# Patient Record
Sex: Male | Born: 1935 | Race: White | Hispanic: No | Smoking: Former smoker
Health system: Southern US, Community
[De-identification: ages and names within clinical notes are randomized; demographics above are authoritative.]

## PROBLEM LIST (undated history)

## (undated) DIAGNOSIS — K5790 Diverticulosis of intestine, part unspecified, without perforation or abscess without bleeding: Secondary | ICD-10-CM

## (undated) DIAGNOSIS — I456 Pre-excitation syndrome: Secondary | ICD-10-CM

## (undated) DIAGNOSIS — J449 Chronic obstructive pulmonary disease, unspecified: Secondary | ICD-10-CM

## (undated) DIAGNOSIS — I4891 Unspecified atrial fibrillation: Secondary | ICD-10-CM

## (undated) DIAGNOSIS — C8190 Hodgkin lymphoma, unspecified, unspecified site: Secondary | ICD-10-CM

## (undated) DIAGNOSIS — T8859XA Other complications of anesthesia, initial encounter: Secondary | ICD-10-CM

## (undated) DIAGNOSIS — E782 Mixed hyperlipidemia: Secondary | ICD-10-CM

## (undated) DIAGNOSIS — T4145XA Adverse effect of unspecified anesthetic, initial encounter: Secondary | ICD-10-CM

## (undated) DIAGNOSIS — M1990 Unspecified osteoarthritis, unspecified site: Secondary | ICD-10-CM

## (undated) DIAGNOSIS — C3490 Malignant neoplasm of unspecified part of unspecified bronchus or lung: Secondary | ICD-10-CM

## (undated) DIAGNOSIS — I10 Essential (primary) hypertension: Secondary | ICD-10-CM

## (undated) DIAGNOSIS — R1310 Dysphagia, unspecified: Secondary | ICD-10-CM

## (undated) HISTORY — PX: NECK LESION BIOPSY: SHX2078

## (undated) HISTORY — PX: COLONOSCOPY: SHX174

## (undated) HISTORY — PX: SKIN CANCER EXCISION: SHX779

## (undated) HISTORY — DX: Unspecified osteoarthritis, unspecified site: M19.90

## (undated) HISTORY — DX: Chronic obstructive pulmonary disease, unspecified: J44.9

## (undated) HISTORY — DX: Pre-excitation syndrome: I45.6

## (undated) HISTORY — DX: Mixed hyperlipidemia: E78.2

## (undated) HISTORY — DX: Essential (primary) hypertension: I10

## (undated) HISTORY — DX: Malignant neoplasm of unspecified part of unspecified bronchus or lung: C34.90

## (undated) HISTORY — PX: CATARACT EXTRACTION: SUR2

## (undated) HISTORY — DX: Diverticulosis of intestine, part unspecified, without perforation or abscess without bleeding: K57.90

## (undated) HISTORY — DX: Unspecified atrial fibrillation: I48.91

---

## 2009-07-05 ENCOUNTER — Encounter: Payer: Self-pay | Admitting: Cardiology

## 2010-04-05 ENCOUNTER — Encounter: Payer: Self-pay | Admitting: Cardiology

## 2010-04-06 ENCOUNTER — Encounter: Payer: Self-pay | Admitting: Cardiology

## 2010-04-14 ENCOUNTER — Encounter: Payer: Self-pay | Admitting: Cardiology

## 2010-04-18 ENCOUNTER — Ambulatory Visit: Payer: Self-pay | Admitting: Cardiology

## 2010-04-18 DIAGNOSIS — I456 Pre-excitation syndrome: Secondary | ICD-10-CM | POA: Insufficient documentation

## 2010-04-18 DIAGNOSIS — E7800 Pure hypercholesterolemia, unspecified: Secondary | ICD-10-CM

## 2010-04-18 DIAGNOSIS — R011 Cardiac murmur, unspecified: Secondary | ICD-10-CM

## 2010-04-18 DIAGNOSIS — R9430 Abnormal result of cardiovascular function study, unspecified: Secondary | ICD-10-CM | POA: Insufficient documentation

## 2010-04-27 ENCOUNTER — Encounter: Payer: Self-pay | Admitting: Cardiology

## 2010-04-27 ENCOUNTER — Ambulatory Visit: Payer: Self-pay | Admitting: Cardiology

## 2010-05-02 ENCOUNTER — Encounter (INDEPENDENT_AMBULATORY_CARE_PROVIDER_SITE_OTHER): Payer: Self-pay | Admitting: *Deleted

## 2010-05-17 ENCOUNTER — Encounter: Payer: Self-pay | Admitting: Cardiology

## 2010-06-16 ENCOUNTER — Ambulatory Visit: Payer: Self-pay | Admitting: Cardiology

## 2010-06-16 DIAGNOSIS — I10 Essential (primary) hypertension: Secondary | ICD-10-CM

## 2010-12-06 NOTE — Letter (Signed)
Summary: External Correspondence/ OFFICE VISIT [facility]  External Correspondence/ OFFICE VISIT [facility]   Imported By: Dorise Hiss 04/15/2010 10:58:49  _____________________________________________________________________  External Attachment:    Type:   Image     Comment:   External Document

## 2010-12-06 NOTE — Letter (Signed)
Summary: External Correspondence/ OFFICE VISIT [facility]  External Correspondence/ OFFICE VISIT [facility]   Imported By: Dorise Hiss 04/15/2010 11:01:23  _____________________________________________________________________  External Attachment:    Type:   Image     Comment:   External Document

## 2010-12-06 NOTE — Assessment & Plan Note (Signed)
Summary: 2 MO F/U PER 6/13 OV-JM   Visit Type:  Follow-up Primary Provider:  Dr. Doreen Beam   History of Present Illness: 76 year old male presents for followup. He reports no problems with recurrent chest pain or unusual shortness of breath. He indicates tolerating Imdur well.  Dakota Shannon remains comfortable for observation. His Cardiolite study from June is detailed below.  I spoke with him about his blood pressure, elevated today. He has not been checking it at home but does have a blood pressure monitor. I asked him to start taking this at least once a week so he can bring a record when he sees Dr. Sherril Croon next time. He may need further medication adjustments.  No palpitations, dizziness or syncope.  Preventive Screening-Counseling & Management  Alcohol-Tobacco     Smoking Status: quit     Year Quit: 1980  Current Medications (verified): 1)  Gemfibrozil 600 Mg Tabs (Gemfibrozil) .... Take 1 Tablet By Mouth Two Times A Day 2)  Propranolol Hcl 80 Mg Tabs (Propranolol Hcl) .... Take 1 Tablet By Mouth Two Times A Day 3)  Lisinopril 10 Mg Tabs (Lisinopril) .... Take 1 Tablet By Mouth Once A Day 4)  Digoxin 0.25 Mg Tabs (Digoxin) .... Take 1 Tablet By Mouth Once A Day 5)  Hydrocodone-Acetaminophen 5-500 Mg Tabs (Hydrocodone-Acetaminophen) .... Take 1 Tablet By Mouth Four Times A Day As Needed 6)  Centrum Silver  Tabs (Multiple Vitamins-Minerals) .... Take 1 Tablet By Mouth Once A Day 7)  Ecotrin Low Strength 81 Mg Tbec (Aspirin) .... Take 1 Tablet By Mouth Once A Day 8)  Isosorbide Mononitrate Cr 30 Mg Xr24h-Tab (Isosorbide Mononitrate) .... Take One Tablet By Mouth Daily  Allergies (verified): No Known Drug Allergies  Comments:  Nurse/Medical Assistant: The patient's medication list and allergies were reviewed with the patient and were updated in the Medication and Allergy Lists.  Past History:  Past Medical History: Last updated: 04/18/2010 Atrial Fibrillation: based on  records from Dr. Sherril Croon - patient not aware of this - not certain when documented Diabetes Type 2 Hyperlipidemia Hypertension Wolff-Parkinson-White syndrome: based on records from Dr. Sherril Croon - patient only aware of "tachycardia" that was diagnosed back in the 1970's requiring longterm medical therapy - no obvious RFA or EP evaluation as best I can tell Arthritis Diverticulosis and history of adenomatous polyp Heart murmur since childhood  Social History: Last updated: 04/18/2010 Retired  Married  Tobacco Use - Former Alcohol Use - no   Review of Systems  The patient denies anorexia, fever, chest pain, syncope, dyspnea on exertion, peripheral edema, melena, and hematochezia.         Otherwise reviewed and negative.  Vital Signs:  Patient profile:   76 year old male Height:      71 inches Weight:      277 pounds Pulse rate:   57 / minute BP sitting:   164 / 84  (left arm) Cuff size:   large  Vitals Entered By: Carlye Grippe (June 16, 2010 12:46 PM)  Physical Exam  Additional Exam:  Obese male in no acute distress, no active chest pain. HEENT: conjunctivae normal, oropharynx clear. Neck: supple, no JVD or bruits. Lungs: clear to auscultation, nonlabored. Cardiac: RRR, 2/6 systolic murmur at base, no S3 or rub. Abdomen: soft, nontender, no guarding, BS present. Skin: no ulcerations. Musculoskeletal: no kyphosis.   Echocardiogram  Procedure date:  04/27/2010  Findings:      Normal left ventricular chamber size and contraction with LVEF 55-60%,  no major valvular abnormalities.  Nuclear Study  Procedure date:  04/06/2010  Findings:      [facility] report:  Exercise Cardiolite with no diagnostic ST segment changes to indicate ischemia, 86% of maximum age-predicted heart rate, 4 METS, hypertensive response with blood pressure 186/88.  LVEF 76% with normal wall motion. Moderate perfusion defect at the apex, essentially fixed, although described as slightly  more prominent on stress imaging. Study interpreted by Dr. Shelva Majestic to show no significant ischemia, however possible small apical scar versus apical thinning.   Impression & Recommendations:  Problem # 1:  NONSPECIFIC ABNORMAL UNSPEC CV FUNCTION STUDY (ICD-794.30)  No progressive chest pain, actually feels better on Imdur. We discussed the abnormal findings at last visit. He remains comfortable with observation. Stress test was not a high-risk abnormal, and will continue to follow him for now. If symptoms progress however, we have already discussed the possibility of a diagnostic cardiac catheterization. Followup visit in 6 months.  Problem # 2:  ESSENTIAL HYPERTENSION, BENIGN (ICD-401.1)  Blood pressure elevated today. I asked him to keep an eye on this, checking it at home perhaps once a week so that he can bring in a record when he is next seen by Dr. Sherril Croon or in our office. He may need further medication adjustments. Sodium restriction and weight loss discussed.  His updated medication list for this problem includes:    Propranolol Hcl 80 Mg Tabs (Propranolol hcl) .Marland Kitchen... Take 1 tablet by mouth two times a day    Lisinopril 10 Mg Tabs (Lisinopril) .Marland Kitchen... Take 1 tablet by mouth once a day    Ecotrin Low Strength 81 Mg Tbec (Aspirin) .Marland Kitchen... Take 1 tablet by mouth once a day  Patient Instructions: 1)  Your physician wants you to follow-up in: 6 months. You will receive a reminder letter in the mail one-two months in advance. If you don't receive a letter, please call our office to schedule the follow-up appointment. 2)  Your physician recommends that you continue on your current medications as directed. Please refer to the Current Medication list given to you today.

## 2010-12-06 NOTE — Letter (Signed)
Summary: Engineer, materials at [facility]  [address]   [place], [place] [address]   Phone: [phone]  Fax: [phone]        May 02, 2010 MRN: [id number]    [address]TS [address] [place], [place]  [address]    Dear Mr. Mcmanaway,  Your test ordered by Selena Batten has been reviewed by your physician (or physician assistant) and was found to be normal or stable. Your physician (or physician assistant) felt no changes were needed at this time.  _X__ Echocardiogram  ____ Cardiac Stress Test  ____ Lab Work  ____ Peripheral vascular study of arms, legs or neck  ____ CT scan or X-ray  ____ Lung or Breathing test  ____ Other:   Thank you.   Cyril Loosen, RN, BSN    Duane Boston, M.D., F.A.C.C. Thressa Sheller, M.D., F.A.C.C. Oneal Grout, M.D., F.A.C.C. Cheree Ditto, M.D., F.A.C.C. Daiva Nakayama, M.D., F.A.C.C. Kenney Houseman, M.D., F.A.C.C. Jeanne Ivan, PA-C

## 2010-12-06 NOTE — Assessment & Plan Note (Signed)
Summary: NP-ABN STRESS   Visit Type:  Initial Consult Primary Provider:  Dr. Doreen Beam   History of Present Illness: 76 year old male referred for cardiology consultation.  He was referred for an exercise cardiolite at [facility] by Dr. Sherril Croon following a recent physical.  The patient had described intermittent chest pain at that time.  The cardiolite was mildly abnormal as noted below, and it was discussed with Dr. Andee Lineman, resulting in consultation today.  [name] states that he has been experiencing a "tingling" discomfort that begins as a "tight ball" in the center of his chest and then resolves quickly.  These episodes are of mild to moderate intensity and resolve in seconds to minutes.  They are not triggered or associated with activity.  They have been sporatic over the last few months and not progressive.  He states that he walks his dog every morning, at least 1.5 miles at a time, and has not had any of these symptoms.  He has had no palpitations or unusual breathlessness.  Recent labs from 31 May revealed BUN 28, creatinine 1.3, AST 21, ALT 18, triglycerides 213, cholesterol 184, HDL 37, LDL 104, sodium 140, potassium 5.0, TSH 0.3, hemoglobin 14.5, platelets 233.  I reviewed his prior ECG and the stress cardiolite report.  I do not have access to the actual images for review.  Preventive Screening-Counseling & Management  Alcohol-Tobacco     Smoking Status: quit     Year Quit: 1980  Current Medications (verified): 1)  Gemfibrozil 600 Mg Tabs (Gemfibrozil) .... Take 1 Tablet By Mouth Two Times A Day 2)  Propranolol Hcl 80 Mg Tabs (Propranolol Hcl) .... Take 1 Tablet By Mouth Two Times A Day 3)  Lisinopril 10 Mg Tabs (Lisinopril) .... Take 1 Tablet By Mouth Once A Day 4)  Digoxin 0.25 Mg Tabs (Digoxin) .... Take 1 Tablet By Mouth Once A Day 5)  Hydrocodone-Acetaminophen 5-500 Mg Tabs (Hydrocodone-Acetaminophen) .... Take 1 Tablet By Mouth Four Times A Day As  Needed 6)  Centrum Silver  Tabs (Multiple Vitamins-Minerals) .... Take 1 Tablet By Mouth Once A Day 7)  Ecotrin Low Strength 81 Mg Tbec (Aspirin) .... Take 1 Tablet By Mouth Once A Day 8)  Isosorbide Mononitrate Cr 30 Mg Xr24h-Tab (Isosorbide Mononitrate) .... Take One Tablet By Mouth Daily  Allergies (verified): No Known Drug Allergies  Comments:  Nurse/Medical Assistant: The patient's medication list and allergies were reviewed with the patient and were updated in the Medication and Allergy Lists.  Past History:  Family History: Last updated: 2010-04-19 Father: died with lung cancer Brother: diabetes mellitus Mother: diabetes mellitus  Social History: Last updated: 04/19/2010 Retired  Married  Tobacco Use - Former Alcohol Use - no  Past Medical History: Atrial Fibrillation: based on records from Dr. Sherril Croon - patient not aware of this - not certain when documented Diabetes Type 2 Hyperlipidemia Hypertension Wolff-Parkinson-White syndrome: based on records from Dr. Sherril Croon - patient only aware of "tachycardia" that was diagnosed back in the 1970's requiring longterm medical therapy - no obvious RFA or EP evaluation as best I can tell Arthritis Diverticulosis and history of adenomatous polyp Heart murmur since childhood  Past Surgical History: Unremarkable  Family History: Father: died with lung cancer Brother: diabetes mellitus Mother: diabetes mellitus  Social History: Retired  Married  Tobacco Use - Former Alcohol Use - no  Review of Systems       The patient complains of chest pain.  The patient denies anorexia, fever,  weight loss, syncope, dyspnea on exertion, peripheral edema, prolonged cough, headaches, abdominal pain, melena, hematochezia, and severe indigestion/heartburn.         Arthritic knee pain, no claudication.  Otherwise reviewed and negative.  Vital Signs:  Patient profile:   76 year old male Height:      71 inches Weight:      277 pounds BMI:      38.77 Pulse rate:   58 / minute BP sitting:   139 / 76  (left arm) Cuff size:   large  Vitals Entered By: Carlye Grippe (April 18, 2010 2:15 PM)  Nutrition Counseling: Patient's BMI is greater than 25 and therefore counseled on weight management options.  Physical Exam  Additional Exam:  Obese male in no acute distress, no active chest pain. HEENT: conjunctivae normal, oropharynx clear. Neck: supple, no JVD or bruits. Lungs: clear to auscultation, nonlabored. Cardiac: RRR, 2/6 systolic murmur at base, no S3 or rub. Abdomen: soft, nontender, no guarding, BS present. Skin: no ulcerations. Musculoskeletal: no kyphosis. Extremities: no pitting edema, pulses 2 plus. Neuropsychiatric: alert and oriented x 3, affect appropriate.   Nuclear Study  Procedure date:  04/06/2010  Findings:      [facility] report:  Exercise Cardiolite with no diagnostic ST segment changes to indicate ischemia, 86% of maximum age-predicted heart rate, 4 METS, hypertensive response with blood pressure 186/88.  LVEF 76% with normal wall motion. Moderate perfusion defect at the apex, essentially fixed, although described as slightly more prominent on stress imaging. Study interpreted by Dr. Shelva Majestic to show no significant ischemia, however possible small apical scar versus apical thinning.  Impression & Recommendations:  Problem # 1:  NONSPECIFIC ABNORMAL UNSPEC CV FUNCTION STUDY (ICD-794.30)  I reviewed the results of the exercise cardiolite with the patient and we discussed the implications.  No ischemia is reported, and certainly variable attenuation artifact could also explain the results.  His symptoms are overall atypical as noted.  We discussed observation on medical therapy versus proceeding to a diagnostic cardiac catheterization, and at this point he prefers the former.  Will add Imdur 30 mg by mouth at bedtime and I will have him return over the next 8 weeks for repeat clinical assessment,  sooner if symptoms worsen.  Orders: 2-D Echocardiogram (2D Echo)  Problem # 2:  CHEST PAIN (ICD-786.50)  As noted above, atypical, and not exertional.  No associated palpitations.  His updated medication list for this problem includes:    Propranolol Hcl 80 Mg Tabs (Propranolol hcl) .Marland Kitchen... Take 1 tablet by mouth two times a day    Lisinopril 10 Mg Tabs (Lisinopril) .Marland Kitchen... Take 1 tablet by mouth once a day    Ecotrin Low Strength 81 Mg Tbec (Aspirin) .Marland Kitchen... Take 1 tablet by mouth once a day    Isosorbide Mononitrate Cr 30 Mg Xr24h-tab (Isosorbide mononitrate) .Marland Kitchen... Take one tablet by mouth daily  Orders: 2-D Echocardiogram (2D Echo)  Problem # 3:  MURMUR (ICD-785.2)  Reportedly longstanding.  Will arrange an echocardiogram for further evaluation.  His updated medication list for this problem includes:    Propranolol Hcl 80 Mg Tabs (Propranolol hcl) .Marland Kitchen... Take 1 tablet by mouth two times a day    Lisinopril 10 Mg Tabs (Lisinopril) .Marland Kitchen... Take 1 tablet by mouth once a day    Digoxin 0.25 Mg Tabs (Digoxin) .Marland Kitchen... Take 1 tablet by mouth once a day    Isosorbide Mononitrate Cr 30 Mg Xr24h-tab (Isosorbide mononitrate) .Marland Kitchen... Take one tablet by  mouth daily  Problem # 4:  WOLFF (WOLFE)-PARKINSON-WHITE (WPW) SYNDROME (ICD-426.7)  Based on very limited history.  On propranolol and digoxin longterm.  No complant of palpitations with recent symptoms.  His updated medication list for this problem includes:    Propranolol Hcl 80 Mg Tabs (Propranolol hcl) .Marland Kitchen... Take 1 tablet by mouth two times a day    Lisinopril 10 Mg Tabs (Lisinopril) .Marland Kitchen... Take 1 tablet by mouth once a day    Ecotrin Low Strength 81 Mg Tbec (Aspirin) .Marland Kitchen... Take 1 tablet by mouth once a day    Isosorbide Mononitrate Cr 30 Mg Xr24h-tab (Isosorbide mononitrate) .Marland Kitchen... Take one tablet by mouth daily  Patient Instructions: 1)  Your physician has requested that you have an echocardiogram.  Echocardiography is a painless test that uses  sound waves to create images of your heart. It provides your doctor with information about the size and shape of your heart and how well your heart's chambers and valves are working.  This procedure takes approximately one hour. There are no restrictions for this procedure. 2)  Start Imdur (isosorbide) 30mg  by mouth once daily. 3)  Follow up appt with Dr. Diona Browner on Thursday, June 16, 2010 at 1pm. Prescriptions: ISOSORBIDE MONONITRATE CR 30 MG XR24H-TAB (ISOSORBIDE MONONITRATE) Take one tablet by mouth daily  #30 x 6   Entered by:   Cyril Loosen, RN, BSN   Authorized by:   Loreli Slot, MD, [facility]   Signed by:   Cyril Loosen, RN, BSN on 04/18/2010   Method used:   Electronically to        [facility]* (retail)       [address]       [place], [place]  [address]       Ph: [phone]       Fax: [phone]   RxID:   [id number]   Handout requested.

## 2010-12-06 NOTE — Letter (Signed)
Summary: Internal Other/ PATIENT HISTORY FORM  Internal Other/ PATIENT HISTORY FORM   Imported By: Dorise Hiss 04/18/2010 14:04:16  _____________________________________________________________________  External Attachment:    Type:   Image     Comment:   External Document

## 2010-12-16 ENCOUNTER — Encounter: Payer: Self-pay | Admitting: Cardiology

## 2010-12-16 ENCOUNTER — Ambulatory Visit (INDEPENDENT_AMBULATORY_CARE_PROVIDER_SITE_OTHER): Payer: Medicare Other | Admitting: Cardiology

## 2010-12-16 DIAGNOSIS — E782 Mixed hyperlipidemia: Secondary | ICD-10-CM

## 2010-12-16 DIAGNOSIS — I10 Essential (primary) hypertension: Secondary | ICD-10-CM

## 2010-12-16 DIAGNOSIS — I456 Pre-excitation syndrome: Secondary | ICD-10-CM

## 2010-12-22 NOTE — Assessment & Plan Note (Signed)
Summary: 6 MO FU PER FEB REMINDER-SRS/JM   Visit Type:  Follow-up Primary Provider:  Dr. Doreen Beam   History of Present Illness: 76 year old male presents for followup. He was seen in August 2011. He reports decreased energy, no obvious chest pain with exertion however.  He states he walks approximately one to one and 1/2 miles each morning, slowly. He is less active in the winter. Today we spent time discussing diet with a goal of weight loss as well as more regular aerobic exercise.  He cares for his chronically ill wife, recovering from cancer. Certainly there is stress involved with this.  We also discussed blood pressure control. Sodium restriction and dietary measures were reviewed.  No complaint of palpitations, dizziness, or syncope.  Preventive Screening-Counseling & Management  Alcohol-Tobacco     Smoking Status: quit     Year Quit: 1980  Current Medications (verified): 1)  Gemfibrozil 600 Mg Tabs (Gemfibrozil) .... Take 1 Tablet By Mouth Two Times A Day 2)  Propranolol Hcl 80 Mg Tabs (Propranolol Hcl) .... Take 1 Tablet By Mouth Two Times A Day 3)  Lisinopril 20 Mg Tabs (Lisinopril) .... Take 1 Tablet By Mouth Once A Day 4)  Digoxin 0.25 Mg Tabs (Digoxin) .... Take 1 Tablet By Mouth Once A Day 5)  Hydrocodone-Acetaminophen 5-500 Mg Tabs (Hydrocodone-Acetaminophen) .... Take 1 Tablet By Mouth Four Times A Day As Needed 6)  Centrum Silver  Tabs (Multiple Vitamins-Minerals) .... Take 1 Tablet By Mouth Once A Day 7)  Ecotrin Low Strength 81 Mg Tbec (Aspirin) .... Take 1 Tablet By Mouth Once A Day 8)  Isosorbide Mononitrate Cr 30 Mg Xr24h-Tab (Isosorbide Mononitrate) .... Take One Tablet By Mouth Daily  Allergies (verified): No Known Drug Allergies  Comments:  Nurse/Medical Assistant: The patient's medication list and allergies were reviewed with the patient and were updated in the Medication and Allergy Lists.  Past History:  Past Medical History: Last updated:  04/18/2010 Atrial Fibrillation: based on records from Dr. Sherril Croon - patient not aware of this - not certain when documented Diabetes Type 2 Hyperlipidemia Hypertension Wolff-Parkinson-White syndrome: based on records from Dr. Sherril Croon - patient only aware of "tachycardia" that was diagnosed back in the 1970's requiring longterm medical therapy - no obvious RFA or EP evaluation as best I can tell Arthritis Diverticulosis and history of adenomatous polyp Heart murmur since childhood  Social History: Last updated: 04/18/2010 Retired  Married  Tobacco Use - Former Alcohol Use - no  Review of Systems       The patient complains of weight gain and dyspnea on exertion.  The patient denies anorexia, fever, chest pain, syncope, peripheral edema, hemoptysis, abdominal pain, melena, and hematochezia.         Otherwise reviewed and negative except as outlined.  Vital Signs:  Patient profile:   76 year old male Height:      71 inches Weight:      282 pounds Pulse rate:   65 / minute BP sitting:   163 / 91  (left arm) Cuff size:   large  Vitals Entered By: Carlye Grippe (December 16, 2010 9:20 AM)  Physical Exam  Additional Exam:  Obese male in no acute distress, no active chest pain. HEENT: conjunctivae normal, oropharynx clear. Neck: supple, no JVD or bruits. Lungs: clear to auscultation, nonlabored. Cardiac: RRR, 2/6 systolic murmur at base, no S3 or rub. Abdomen: soft, nontender, no guarding, BS present. Skin: no ulcerations. Musculoskeletal: no kyphosis. Extremities: No pitting edema. Neuropsychiatric:  Alert and oriented x3, affect appropriate.   EKG  Procedure date:  12/16/2010  Findings:      Sinus rhythm at 64 beats per minute with nonspecific ST-T changes, likely digoxin effect.  Prior Report Reviewed for Nuclear Study:  Findings: 04/06/2010 [facility] Internal Medicine report:  Exercise Cardiolite with no diagnostic ST segment changes to indicate ischemia, 86% of maximum  age-predicted heart rate, 4 METS, hypertensive response with blood pressure 186/88.  LVEF 76% with normal wall motion. Moderate perfusion defect at the apex, essentially fixed, although described as slightly more prominent on stress imaging. Study interpreted by Dr. Shelva Majestic to show no significant ischemia, however possible small apical scar versus apical thinning.  Prior Report Reviewed for Echocardiogram:  Findings: 04/27/2010 Normal left ventricular chamber size and contraction with LVEF 55-60%, no major valvular abnormalities.  Comments:    Impression & Recommendations:  Problem # 1:  ESSENTIAL HYPERTENSION, BENIGN (ICD-401.1)  Not well-controlled. Plan to advance lisinopril to 20 mg daily, followup BMET in 2 weeks. In addition we discussed diet with goal of weight loss, sodium restriction.  His updated medication list for this problem includes:    Propranolol Hcl 80 Mg Tabs (Propranolol hcl) .Marland Kitchen... Take 1 tablet by mouth two times a day    Lisinopril 20 Mg Tabs (Lisinopril) .Marland Kitchen... Take 1 tablet by mouth once a day    Ecotrin Low Strength 81 Mg Tbec (Aspirin) .Marland Kitchen... Take 1 tablet by mouth once a day  Future Orders: T-Basic Metabolic Panel [id number]) ... 12/30/2010  Problem # 2:  NONSPECIFIC ABNORMAL UNSPEC CV FUNCTION STUDY (ICD-794.30)  Noted above. No obvious anginal symptoms. Continue observation.  Problem # 3:  WOLFF (WOLFE)-PARKINSON-WHITE (WPW) SYNDROME (ICD-426.7)  Based on history. No reported palpitations.  His updated medication list for this problem includes:    Propranolol Hcl 80 Mg Tabs (Propranolol hcl) .Marland Kitchen... Take 1 tablet by mouth two times a day    Lisinopril 20 Mg Tabs (Lisinopril) .Marland Kitchen... Take 1 tablet by mouth once a day    Ecotrin Low Strength 81 Mg Tbec (Aspirin) .Marland Kitchen... Take 1 tablet by mouth once a day    Isosorbide Mononitrate Cr 30 Mg Xr24h-tab (Isosorbide mononitrate) .Marland Kitchen... Take one tablet by mouth daily  Problem # 4:  PURE HYPERCHOLESTEROLEMIA  (ICD-272.0)  Followed by Dr. Sherril Croon.  His updated medication list for this problem includes:    Gemfibrozil 600 Mg Tabs (Gemfibrozil) .Marland Kitchen... Take 1 tablet by mouth two times a day  Other Orders: EKG w/ Interpretation (93000)  Patient Instructions: 1)  Increase Lisinopril to 20mg  daily 2)  Labs:  BMET in 2 weeks 3)  Follow up in  6 months Prescriptions: LISINOPRIL 20 MG TABS (LISINOPRIL) Take 1 tablet by mouth once a day  #30 x 6   Entered by:   Hoover Brunette, LPN   Authorized by:   Loreli Slot, MD, [facility]   Signed by:   Hoover Brunette, LPN on 09/81/1914   Method used:   Electronically to        [facility]* (retail)       [address]       [place], [place]  [address]       Ph: [phone]       Fax: [phone]   RxID:   [id number]

## 2010-12-30 ENCOUNTER — Encounter: Payer: Self-pay | Admitting: Cardiology

## 2011-01-03 ENCOUNTER — Encounter (INDEPENDENT_AMBULATORY_CARE_PROVIDER_SITE_OTHER): Payer: Self-pay | Admitting: *Deleted

## 2011-01-12 NOTE — Letter (Signed)
Summary: Engineer, materials at [facility]  [address]   [place], [place] [address]   Phone: [phone]  Fax: [phone]        January 03, 2011 MRN: [id number]    Dakota Shannon [address] [place], [place]  [address]    Dear [name],  Your test ordered by Selena Batten has been reviewed by your physician (or physician assistant) and was found to be normal or stable. Your physician (or physician assistant) felt no changes were needed at this time.  ____ Echocardiogram  ____ Cardiac Stress Test  _X___ Lab Work  ____ Peripheral vascular study of arms, legs or neck  ____ CT scan or X-ray  ____ Lung or Breathing test  ____ Other:   Thank you.   Cyril Loosen, RN, BSN    Duane Boston, M.D., F.A.C.C. Thressa Sheller, M.D., F.A.C.C. Oneal Grout, M.D., F.A.C.C. Cheree Ditto, M.D., F.A.C.C. Daiva Nakayama, M.D., F.A.C.C. Kenney Houseman, M.D., F.A.C.C. Jeanne Ivan, PA-C

## 2011-06-04 ENCOUNTER — Other Ambulatory Visit: Payer: Self-pay | Admitting: Cardiology

## 2011-06-05 NOTE — Telephone Encounter (Signed)
[place] pt. 

## 2011-07-03 ENCOUNTER — Ambulatory Visit (INDEPENDENT_AMBULATORY_CARE_PROVIDER_SITE_OTHER): Payer: Medicare Other | Admitting: Cardiology

## 2011-07-03 ENCOUNTER — Encounter: Payer: Self-pay | Admitting: Cardiology

## 2011-07-03 VITALS — BP 153/88 | HR 65 | Ht 71.0 in | Wt 272.0 lb

## 2011-07-03 DIAGNOSIS — R011 Cardiac murmur, unspecified: Secondary | ICD-10-CM

## 2011-07-03 DIAGNOSIS — I456 Pre-excitation syndrome: Secondary | ICD-10-CM

## 2011-07-03 DIAGNOSIS — I10 Essential (primary) hypertension: Secondary | ICD-10-CM

## 2011-07-03 NOTE — Assessment & Plan Note (Signed)
Benign. Echocardiogram from June of last year showed LVEF 55-60% with no significant valvular abnormalities. 

## 2011-07-03 NOTE — Assessment & Plan Note (Signed)
Continue present regimen. Weight loss is indicated. We discussed diet and exercise. May still need further medication titration.

## 2011-07-03 NOTE — Patient Instructions (Signed)
Your physician wants you to follow-up in: 6 months. You will receive a reminder letter in the mail one-two months in advance. If you don't receive a letter, please call our office to schedule the follow-up appointment. Your physician recommends that you continue on your current medications as directed. Please refer to the Current Medication list given to you today. 

## 2011-07-03 NOTE — Assessment & Plan Note (Signed)
Quiescent at this time

## 2011-07-03 NOTE — Progress Notes (Signed)
Clinical Summary Mr. Koenen is a 76 y.o.male presenting for followup. He was seen in February of this year.  He states that he has been feeling "pretty good." No chest pain or progressive shortness of breath. Does walk approximately a mile in the mornings at a slow pace.  Blood pressure has been elevated, and medication doses adjusted over time. He reports compliance with a low-salt diet, still states that he "eats well." He talked about diet, portion size, weight loss.  Denies any significant palpitations. ECG is reviewed.  No Known Allergies  Medication list reviewed.  Past Medical History  Diagnosis Date  . Atrial fibrillation     Possible history based on limited information  . Type 2 diabetes mellitus   . Mixed hyperlipidemia   . Essential hypertension, benign   . Wolff-Parkinson-White (WPW) syndrome     Possible history based on limited information  . Arthritis   . Diverticulosis   . Heart murmur     Social History Mr. Speir reports that he quit smoking about 31 years ago. His smoking use included Cigarettes. He has a 20 pack-year smoking history. He has never used smokeless tobacco. Mr. Salada reports that he does not drink alcohol.  Review of Systems As outlined above, otherwise negative.  Physical Examination Filed Vitals:   07/03/11 1544  BP: 153/88  Pulse: 65   Obese male in no acute distress, no active chest pain.  HEENT: conjunctivae normal, oropharynx clear.  Neck: supple, no JVD or bruits.  Lungs: clear to auscultation, nonlabored.  Cardiac: RRR, 2/6 systolic murmur at base, no S3 or rub.  Abdomen: soft, nontender, no guarding, BS present.  Skin: no ulcerations.  Musculoskeletal: no kyphosis.  Extremities: No pitting edema.  Neuropsychiatric: Alert and oriented x3, affect appropriate.   ECG Reviewed in EMR.  Studies Exercise Cardiolite 04/06/2010 [facility]): No diagnostic ST segment changes to indicate ischemia, 86% of maximum  age-predicted heart rate, 4 METS, hypertensive response with blood pressure 186/88. LVEF 76% with normal wall motion. Moderate perfusion defect at the apex, essentially fixed, although described as slightly more prominent on stress imaging. Study interpreted by Dr. Shelva Majestic to show no significant ischemia, however possible small apical scar versus apical thinning.   Problem List and Plan

## 2011-07-21 ENCOUNTER — Other Ambulatory Visit: Payer: Self-pay | Admitting: *Deleted

## 2011-07-21 MED ORDER — LISINOPRIL 20 MG PO TABS: 20.0000 mg | ORAL_TABLET | Freq: Every day | ORAL | Status: DC

## 2012-02-02 ENCOUNTER — Other Ambulatory Visit: Payer: Self-pay | Admitting: Cardiology

## 2012-03-18 ENCOUNTER — Other Ambulatory Visit: Payer: Self-pay | Admitting: *Deleted

## 2012-03-18 MED ORDER — LISINOPRIL 20 MG PO TABS: 20.0000 mg | ORAL_TABLET | Freq: Every day | ORAL | Status: DC

## 2012-06-05 ENCOUNTER — Other Ambulatory Visit: Payer: Self-pay | Admitting: Cardiology

## 2012-06-05 ENCOUNTER — Telehealth: Payer: Self-pay | Admitting: Cardiology

## 2012-06-05 MED ORDER — ISOSORBIDE MONONITRATE ER 30 MG PO TB24: 30.0000 mg | ORAL_TABLET | Freq: Every day | ORAL | Status: DC

## 2012-06-05 NOTE — Telephone Encounter (Signed)
Patient called in and wanted refill for IMDUR. I sent refill in to [facility] for a 30 day supply with 3 refills. Transferred call to [facility] to make office visit.

## 2012-07-12 ENCOUNTER — Ambulatory Visit (INDEPENDENT_AMBULATORY_CARE_PROVIDER_SITE_OTHER): Payer: Medicare Other | Admitting: Cardiology

## 2012-07-12 ENCOUNTER — Encounter: Payer: Self-pay | Admitting: Cardiology

## 2012-07-12 VITALS — BP 159/86 | HR 54 | Ht 67.0 in | Wt 264.0 lb

## 2012-07-12 DIAGNOSIS — I456 Pre-excitation syndrome: Secondary | ICD-10-CM

## 2012-07-12 DIAGNOSIS — E7800 Pure hypercholesterolemia, unspecified: Secondary | ICD-10-CM

## 2012-07-12 DIAGNOSIS — I10 Essential (primary) hypertension: Secondary | ICD-10-CM

## 2012-07-12 NOTE — Patient Instructions (Addendum)
Your physician recommends that you schedule a follow-up appointment in: 1 year. You will receive a reminder letter in the mail in about months 10 reminding you to call and schedule your appointment. If you don't receive this letter, please contact our office.  Your physician recommends that you continue on your current medications as directed. Please refer to the Current Medication list given to you today.  

## 2012-07-12 NOTE — Progress Notes (Signed)
   Clinical Summary Dakota Shannon is a 77 y.o.male presenting for followup. He was seen in August of last year. He reports no sense of palpitations, no breathlessness with typical activity. Still walks at least a mile a day.  His blood pressure is elevated today. He states that when he checks it at home, systolics are typically in the 130s however. He reports compliance with his medications.  ECG today shows sinus bradycardia. He reports having a recent wellness physical with Dr. Sherril Croon - we will request lab work.   No Known Allergies  Current Outpatient Prescriptions  Medication Sig Dispense Refill  . aspirin EC 81 MG tablet Take 81 mg by mouth daily.        . digoxin (LANOXIN) 0.25 MG tablet Take 250 mcg by mouth daily.        Marland Kitchen gemfibrozil (LOPID) 600 MG tablet Take 600 mg by mouth 2 (two) times daily before a meal.        . HYDROcodone-acetaminophen (VICODIN) 5-500 MG per tablet Take 1 tablet by mouth every 6 (six) hours as needed.        . isosorbide mononitrate (IMDUR) 30 MG 24 hr tablet Take 1 tablet (30 mg total) by mouth daily.  30 tablet  3  . lisinopril (PRINIVIL,ZESTRIL) 20 MG tablet Take 1 tablet (20 mg total) by mouth daily.  30 tablet  1  . Multiple Vitamins-Minerals (CENTRUM SILVER PO) Take 1 tablet by mouth daily.        . propranolol (INDERAL) 80 MG tablet Take 80 mg by mouth 2 (two) times daily.          Past Medical History  Diagnosis Date  . Atrial fibrillation     Possible history based on limited information  . Type 2 diabetes mellitus   . Mixed hyperlipidemia   . Essential hypertension, benign   . Wolff-Parkinson-White (WPW) syndrome     Possible history based on limited information  . Arthritis   . Diverticulosis   . Heart murmur     Social History [name] reports that he quit smoking about 32 years ago. His smoking use included Cigarettes. He has a 20 pack-year smoking history. He has never used smokeless tobacco. [name] reports that he does not  drink alcohol.  Review of Systems No orthopnea or PND. No claudication. Otherwise negative.  Physical Examination Filed Vitals:   07/12/12 0819  BP: 159/86  Pulse: 54    Obese male in no acute distress, no active chest pain.  HEENT: conjunctivae normal, oropharynx clear.  Neck: supple, no JVD or bruits.  Lungs: clear to auscultation, nonlabored.  Cardiac: RRR, 2/6 systolic murmur at base, no S3 or rub.  Abdomen: soft, nontender, no guarding, BS present.  Skin: no ulcerations.  Musculoskeletal: no kyphosis.  Extremities: No pitting edema.  Neuropsychiatric: Alert and oriented x3, affect appropriate.    Problem List and Plan   ESSENTIAL HYPERTENSION, BENIGN Encouraged regular exercise and weight loss, also sodium restriction. Asked him to keep a more regular check on blood pressure home. If the systolics are ranging more in the 140s or higher, we will need to consider increasing his lisinopril further.  PURE HYPERCHOLESTEROLEMIA We will request lab work for review from Dr. Sherril Croon.  WOLFF (WOLFE)-PARKINSON-WHITE (WPW) SYNDROME Quiescent.    Jonelle Sidle, M.D., F.A.C.C.

## 2012-07-12 NOTE — Assessment & Plan Note (Signed)
Encouraged regular exercise and weight loss, also sodium restriction. Asked him to keep a more regular check on blood pressure home. If the systolics are ranging more in the 140s or higher, we will need to consider increasing his lisinopril further.

## 2012-07-12 NOTE — Assessment & Plan Note (Signed)
Quiescent

## 2012-07-12 NOTE — Assessment & Plan Note (Signed)
We will request lab work for review from Dr. Sherril Croon.

## 2012-10-01 ENCOUNTER — Other Ambulatory Visit: Payer: Self-pay | Admitting: Cardiology

## 2012-10-01 NOTE — Telephone Encounter (Signed)
rx sent to pharmacy by e-script per pt compliance

## 2013-07-04 ENCOUNTER — Other Ambulatory Visit: Payer: Self-pay | Admitting: Cardiology

## 2013-07-28 ENCOUNTER — Ambulatory Visit: Payer: Medicare Other | Admitting: Cardiology

## 2013-07-28 ENCOUNTER — Ambulatory Visit (INDEPENDENT_AMBULATORY_CARE_PROVIDER_SITE_OTHER): Payer: Medicare Other | Admitting: Cardiology

## 2013-07-28 ENCOUNTER — Encounter: Payer: Self-pay | Admitting: Cardiology

## 2013-07-28 VITALS — BP 176/80 | HR 59 | Ht 71.0 in | Wt 268.0 lb

## 2013-07-28 DIAGNOSIS — I456 Pre-excitation syndrome: Secondary | ICD-10-CM

## 2013-07-28 DIAGNOSIS — I10 Essential (primary) hypertension: Secondary | ICD-10-CM

## 2013-07-28 NOTE — Patient Instructions (Signed)
Continue all current medications. Your physician wants you to follow up in: 6 months.  You will receive a reminder letter in the mail one-two months in advance.  If you don't receive a letter, please call our office to schedule the follow up appointment   

## 2013-07-28 NOTE — Assessment & Plan Note (Signed)
Symptomatically stable, ECG reviewed. Continue current regimen. 

## 2013-07-28 NOTE — Assessment & Plan Note (Signed)
He reports medication compliance. We discussed diet, sodium restriction and weight loss. I also asked him to start recording blood pressures over the next few weeks, then follow with Dr. Sherril Croon. He will likely need further up titration of lisinopril, perhaps even addition of HCTZ.

## 2013-07-28 NOTE — Progress Notes (Signed)
   Clinical Summary Dakota Shannon is a 78 y.o.male last seen in September 2013. He reports no palpitations, no anginal chest pain. Admits that he has not been exercising as regularly.  ECG reviewed showing sinus rhythm with short PR interval.  Blood pressure significantly elevated today. He reports compliance with his medications. He has not been checking his blood pressures regularly. He is due to see Dr. Sherril Shannon next month.  No Known Allergies  Current Outpatient Prescriptions  Medication Sig Dispense Refill  . aspirin EC 81 MG tablet Take 81 mg by mouth daily.        . digoxin (LANOXIN) 0.25 MG tablet Take 250 mcg by mouth daily.        Marland Kitchen gemfibrozil (LOPID) 600 MG tablet Take 600 mg by mouth 2 (two) times daily before a meal.        . HYDROcodone-acetaminophen (VICODIN) 5-500 MG per tablet Take 1 tablet by mouth every 6 (six) hours as needed.        . isosorbide mononitrate (IMDUR) 30 MG 24 hr tablet TAKE 1 TABLET BY MOUTH EVERY DAY  30 tablet  6  . lisinopril (PRINIVIL,ZESTRIL) 20 MG tablet Take 1 tablet (20 mg total) by mouth daily.  30 tablet  1  . Multiple Vitamins-Minerals (CENTRUM SILVER PO) Take 1 tablet by mouth daily.        . propranolol (INDERAL) 80 MG tablet Take 80 mg by mouth 2 (two) times daily.         No current facility-administered medications for this visit.    Past Medical History  Diagnosis Date  . Atrial fibrillation     Possible history based on limited information  . Type 2 diabetes mellitus   . Mixed hyperlipidemia   . Essential hypertension, benign   . Wolff-Parkinson-White (WPW) syndrome     Possible history based on limited information  . Arthritis   . Diverticulosis   . Heart murmur     Social History Dakota Shannon reports that he quit smoking about 33 years ago. His smoking use included Cigarettes. He has a 20 pack-year smoking history. He has never used smokeless tobacco. Dakota Shannon reports that he does not drink alcohol.  Review of Systems As  outlined above. States that he had a melanoma removed, also umbilical surgery. Otherwise negative.  Physical Examination Filed Vitals:   07/28/13 1452  BP: 176/80  Pulse: 59   Filed Weights   07/28/13 1452  Weight: 268 lb (121.564 kg)    Obese male in no acute distress, no active chest pain.  HEENT: conjunctivae normal, oropharynx clear.  Neck: supple, no JVD or bruits.  Lungs: clear to auscultation, nonlabored.  Cardiac: RRR, 2/6 systolic murmur at base, no S3 or rub.  Abdomen: soft, nontender, no guarding, BS present.  Skin: no ulcerations.  Musculoskeletal: no kyphosis.  Extremities: No pitting edema.  Neuropsychiatric: Alert and oriented x3, affect appropriate.   Problem List and Plan   WOLFF (WOLFE)-PARKINSON-WHITE (WPW) SYNDROME Symptomatically stable, ECG reviewed. Continue current regimen.  Essential hypertension, benign He reports medication compliance. We discussed diet, sodium restriction and weight loss. I also asked him to start recording blood pressures over the next few weeks, then follow with Dr. Sherril Shannon. He will likely need further up titration of lisinopril, perhaps even addition of HCTZ.    Jonelle Sidle, M.D., F.A.C.C.

## 2013-09-11 ENCOUNTER — Other Ambulatory Visit: Payer: Self-pay

## 2014-02-23 ENCOUNTER — Encounter: Payer: Self-pay | Admitting: Cardiology

## 2014-02-27 ENCOUNTER — Other Ambulatory Visit: Payer: Self-pay | Admitting: Cardiology

## 2014-03-18 ENCOUNTER — Encounter: Payer: Self-pay | Admitting: Cardiology

## 2014-03-18 ENCOUNTER — Ambulatory Visit (INDEPENDENT_AMBULATORY_CARE_PROVIDER_SITE_OTHER): Payer: Medicare Other | Admitting: Cardiology

## 2014-03-18 VITALS — BP 153/81 | HR 54 | Ht 71.0 in | Wt 263.0 lb

## 2014-03-18 DIAGNOSIS — I456 Pre-excitation syndrome: Secondary | ICD-10-CM

## 2014-03-18 DIAGNOSIS — I10 Essential (primary) hypertension: Secondary | ICD-10-CM

## 2014-03-18 DIAGNOSIS — R011 Cardiac murmur, unspecified: Secondary | ICD-10-CM

## 2014-03-18 NOTE — Assessment & Plan Note (Signed)
Benign. Echocardiogram from June of last year showed LVEF 55-60% with no significant valvular abnormalities.

## 2014-03-18 NOTE — Patient Instructions (Signed)
Continue all current medications. Your physician wants you to follow up in: 6 months.  You will receive a reminder letter in the mail one-two months in advance.  If you don't receive a letter, please call our office to schedule the follow up appointment   

## 2014-03-18 NOTE — Progress Notes (Signed)
    Clinical Summary Mr. Hulsebus is a 79 y.o.male last seen in September 2014. He reports no palpitations or chest pain. 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 He reports compliance with his medications. Weight is down about 5 pounds.  ECG from September shows sinus bradycardia no definite evidence of preexcitation.  Blood pressure is mildly elevated today. He recalls a systolic of "242" at his last visit with Dr. Woody Seller.   No Known Allergies  Current Outpatient Prescriptions  Medication Sig Dispense Refill  . aspirin EC 81 MG tablet Take 81 mg by mouth daily.        . digoxin (LANOXIN) 0.25 MG tablet Take 250 mcg by mouth daily.        Marland Kitchen gemfibrozil (LOPID) 600 MG tablet Take 600 mg by mouth 2 (two) times daily before a meal.        . HYDROcodone-acetaminophen (VICODIN) 5-500 MG per tablet Take 1 tablet by mouth every 6 (six) hours as needed.        . isosorbide mononitrate (IMDUR) 30 MG 24 hr tablet TAKE 1 TABLET BY MOUTH EVERY DAY  30 tablet  6  . lisinopril (PRINIVIL,ZESTRIL) 20 MG tablet Take one tablet by mouth every morning & 1/2 tablet every evening      . Multiple Vitamins-Minerals (CENTRUM SILVER PO) Take 1 tablet by mouth daily.        . propranolol (INDERAL) 80 MG tablet Take 80 mg by mouth 2 (two) times daily.         No current facility-administered medications for this visit.    Past Medical History  Diagnosis Date  . Atrial fibrillation     Possible history based on limited information  . Type 2 diabetes mellitus   . Mixed hyperlipidemia   . Essential hypertension, benign   . Wolff-Parkinson-White (WPW) syndrome     Possible history based on limited information  . Arthritis   . Diverticulosis   . Heart murmur     Social History Mr. Luis reports that he quit smoking about 34 years ago. His smoking use included Cigarettes. He has a 20 pack-year smoking history. He has never used smokeless tobacco. Mr. Gulley reports that he does  not drink alcohol.  Review of Systems Negative except as outlined.  Physical Examination Filed Vitals:   03/18/14 1448  BP: 153/81  Pulse: 54   Filed Weights   03/18/14 1448  Weight: 263 lb (119.296 kg)    Obese male in no acute distress.  HEENT: conjunctivae normal, oropharynx clear.  Neck: supple, no JVD or bruits.  Lungs: clear to auscultation, nonlabored.  Cardiac: RRR, 2/6 systolic murmur at base, no S3 or rub.  Abdomen: soft, nontender, no guarding, BS present.  Skin: no ulcerations.  Musculoskeletal: no kyphosis.  Extremities: No pitting edema.  Neuropsychiatric: Alert and oriented x3, affect appropriate.   Problem List and Plan   WOLFF (WOLFE)-PARKINSON-WHITE (WPW) SYNDROME Quiescent. No palpitations, dizziness, syncope.  Essential hypertension, benign Blood pressure mildly elevated today. I asked him to continue to work on weight loss. Reduce sodium in the diet. Lisinopril could be changed to lisinopril HCT 40/12.5 mg daily with perhaps better blood pressure control. He will follow up with Dr. Woody Seller later in the summer for a physical.  MURMUR Benign. Echocardiogram from June of last year showed LVEF 55-60% with no significant valvular abnormalities.    Satira Sark, M.D., F.A.C.C.

## 2014-03-18 NOTE — Assessment & Plan Note (Signed)
Quiescent. No palpitations, dizziness, syncope.

## 2014-03-18 NOTE — Assessment & Plan Note (Signed)
Blood pressure mildly elevated today. I asked him to continue to work on weight loss. Reduce sodium in the diet. Lisinopril could be changed to lisinopril HCT 40/12.5 mg daily with perhaps better blood pressure control. He will follow up with Dr. Woody Seller later in the summer for a physical.

## 2014-09-23 ENCOUNTER — Other Ambulatory Visit: Payer: Self-pay | Admitting: Cardiology

## 2014-10-23 ENCOUNTER — Other Ambulatory Visit: Payer: Self-pay | Admitting: Cardiology

## 2014-11-17 ENCOUNTER — Telehealth: Payer: Self-pay | Admitting: Cardiology

## 2014-11-17 MED ORDER — ISOSORBIDE MONONITRATE ER 30 MG PO TB24: 30.0000 mg | ORAL_TABLET | Freq: Every day | ORAL | Status: DC

## 2014-11-17 NOTE — Telephone Encounter (Signed)
Mr. Simmers needs refill on   isosorbide mononitrate (IMDUR) 30 MG 24 hr tablet  States that [facility] told him to call office for A refill. Appointment is being made for patient also to see. Dr. Domenic Polite.

## 2014-11-25 ENCOUNTER — Other Ambulatory Visit: Payer: Self-pay | Admitting: Cardiology

## 2014-12-02 ENCOUNTER — Ambulatory Visit (INDEPENDENT_AMBULATORY_CARE_PROVIDER_SITE_OTHER): Payer: Medicare Other | Admitting: Cardiology

## 2014-12-02 ENCOUNTER — Encounter: Payer: Self-pay | Admitting: Cardiology

## 2014-12-02 VITALS — BP 148/72 | HR 60 | Ht 71.0 in | Wt 274.0 lb

## 2014-12-02 DIAGNOSIS — R010 Benign and innocent cardiac murmurs: Secondary | ICD-10-CM

## 2014-12-02 DIAGNOSIS — I456 Pre-excitation syndrome: Secondary | ICD-10-CM

## 2014-12-02 DIAGNOSIS — R011 Cardiac murmur, unspecified: Secondary | ICD-10-CM

## 2014-12-02 DIAGNOSIS — I10 Essential (primary) hypertension: Secondary | ICD-10-CM

## 2014-12-02 NOTE — Assessment & Plan Note (Signed)
Reported by history, no preexcitation pattern on ECG. He continues on Inderal and digoxin, reports no symptoms.

## 2014-12-02 NOTE — Assessment & Plan Note (Signed)
No obvious valvular disease reported approximately 4 years ago, follow-up echocardiogram will be obtained however given persistence and slight progression in murmur.

## 2014-12-02 NOTE — Patient Instructions (Signed)
Your physician recommends that you schedule a follow-up appointment in: 8 months. You will receive a reminder letter in the mail in about 6 months reminding you to call and schedule your appointment. If you don't receive this letter, please contact our office. Your physician recommends that you continue on your current medications as directed. Please refer to the Current Medication list given to you today. Your physician has requested that you have an echocardiogram. Echocardiography is a painless test that uses sound waves to create images of your heart. It provides your doctor with information about the size and shape of your heart and how well your heart's chambers and valves are working. This procedure takes approximately one hour. There are no restrictions for this procedure.

## 2014-12-02 NOTE — Progress Notes (Signed)
   Reason for visit: History of WPW  Clinical Summary Dakota Shannon is a 80 y.o.male last seen in May 2015. He comes in for a routine visit today. He has had no palpitations, no sudden dizziness or syncope. Continues on digoxin and Inderal. ECG today shows sinus bradycardia with nonspecific T-wave changes, no obvious preexcitation.  He continues to function with no change in his typical ADLs. No exertional chest pain or progressing shortness of breath.  Echocardiogram from June 2011 revealed LVEF 55-60%, no major valvular abnormalities.   No Known Allergies  Current Outpatient Prescriptions  Medication Sig Dispense Refill  . aspirin EC 81 MG tablet Take 81 mg by mouth daily.      . digoxin (LANOXIN) 0.25 MG tablet Take 250 mcg by mouth daily.      Marland Kitchen gemfibrozil (LOPID) 600 MG tablet Take 600 mg by mouth 2 (two) times daily before a meal.      . HYDROcodone-acetaminophen (VICODIN) 5-500 MG per tablet Take 1 tablet by mouth every 6 (six) hours as needed.      . isosorbide mononitrate (IMDUR) 30 MG 24 hr tablet TAKE 1 TABLET BY MOUTH DAILY - PATIENT NEEDS OFFICE VISIT 15 tablet 0  . lisinopril (PRINIVIL,ZESTRIL) 20 MG tablet Take one tablet by mouth every morning & 1/2 tablet every evening    . Multiple Vitamins-Minerals (CENTRUM SILVER PO) Take 1 tablet by mouth daily.      . propranolol (INDERAL) 80 MG tablet Take 80 mg by mouth 2 (two) times daily.       No current facility-administered medications for this visit.    Past Medical History  Diagnosis Date  . Atrial fibrillation     Possible history based on limited information  . Type 2 diabetes mellitus   . Mixed hyperlipidemia   . Essential hypertension, benign   . Wolff-Parkinson-White (WPW) syndrome     Possible history based on limited information  . Arthritis   . Diverticulosis   . Heart murmur     Social History Dakota Shannon reports that he quit smoking about 35 years ago. His smoking use included Cigarettes. He has a 20  pack-year smoking history. He has never used smokeless tobacco. Dakota Shannon reports that he does not drink alcohol.  Review of Systems Complete review of systems negative except as otherwise outlined in the clinical summary and also the following. No falls.  Physical Examination Filed Vitals:   12/02/14 1352  BP: 148/72  Pulse: 60    Wt Readings from Last 3 Encounters:  12/02/14 274 lb (124.286 kg)  03/18/14 263 lb (119.296 kg)  07/28/13 268 lb (121.564 kg)   Obese male in no acute distress.  HEENT: conjunctivae normal, oropharynx clear.  Neck: supple, no JVD or bruits.  Lungs: clear to auscultation, nonlabored.  Cardiac: RRR, 2/6 systolic murmur at base, no S3 or rub.  Abdomen: soft, nontender, no guarding, BS present.  Skin: no ulcerations.    Problem List and Plan   WOLFF (WOLFE)-PARKINSON-WHITE (WPW) SYNDROME Reported by history, no preexcitation pattern on ECG. He continues on Inderal and digoxin, reports no symptoms.   MURMUR No obvious valvular disease reported approximately 4 years ago, follow-up echocardiogram will be obtained however given persistence and slight progression in murmur.     Dakota Shannon, M.D., F.A.C.C.

## 2014-12-03 ENCOUNTER — Other Ambulatory Visit (INDEPENDENT_AMBULATORY_CARE_PROVIDER_SITE_OTHER): Payer: Medicare Other

## 2014-12-03 ENCOUNTER — Other Ambulatory Visit: Payer: Self-pay

## 2014-12-03 DIAGNOSIS — I10 Essential (primary) hypertension: Secondary | ICD-10-CM

## 2014-12-03 DIAGNOSIS — I456 Pre-excitation syndrome: Secondary | ICD-10-CM

## 2014-12-03 DIAGNOSIS — R011 Cardiac murmur, unspecified: Secondary | ICD-10-CM

## 2014-12-07 ENCOUNTER — Telehealth: Payer: Self-pay | Admitting: *Deleted

## 2014-12-07 NOTE — Telephone Encounter (Signed)
-----   Message from Satira Sark, MD sent at 12/07/2014  9:15 AM EST ----- Reviewed report. Please let him know that cardiac contractile function is in normal range. Some calcification noted of the aortic valve with mild dilatation of the aortic root. No major changes to require further evaluation as this time.

## 2014-12-07 NOTE — Telephone Encounter (Signed)
Notes Recorded by Laurine Blazer, LPN on 03/15/7415 at 3:84 PM Patient notified and verbalized understanding.

## 2014-12-07 NOTE — Telephone Encounter (Signed)
Gave verbal refill to [facility]. Pt was out of Imdur. #30 with 6 refills. Pt was seen 12/02/14 by Dr. Domenic Polite

## 2014-12-11 DIAGNOSIS — R590 Localized enlarged lymph nodes: Secondary | ICD-10-CM | POA: Insufficient documentation

## 2014-12-11 DIAGNOSIS — N2889 Other specified disorders of kidney and ureter: Secondary | ICD-10-CM | POA: Insufficient documentation

## 2014-12-11 DIAGNOSIS — C3410 Malignant neoplasm of upper lobe, unspecified bronchus or lung: Secondary | ICD-10-CM | POA: Insufficient documentation

## 2014-12-25 ENCOUNTER — Other Ambulatory Visit (HOSPITAL_COMMUNITY): Payer: Self-pay | Admitting: Internal Medicine

## 2014-12-25 DIAGNOSIS — C3401 Malignant neoplasm of right main bronchus: Secondary | ICD-10-CM

## 2014-12-30 ENCOUNTER — Ambulatory Visit (HOSPITAL_COMMUNITY)
Admission: RE | Admit: 2014-12-30 | Discharge: 2014-12-30 | Disposition: A | Discharge status: Home / Self Care | Payer: Medicare Other | Source: Ambulatory Visit | Attending: Internal Medicine | Admitting: Internal Medicine

## 2014-12-30 DIAGNOSIS — R918 Other nonspecific abnormal finding of lung field: Secondary | ICD-10-CM | POA: Insufficient documentation

## 2014-12-30 DIAGNOSIS — Z87891 Personal history of nicotine dependence: Secondary | ICD-10-CM | POA: Diagnosis not present

## 2014-12-30 DIAGNOSIS — Z79899 Other long term (current) drug therapy: Secondary | ICD-10-CM | POA: Diagnosis not present

## 2014-12-30 DIAGNOSIS — C3401 Malignant neoplasm of right main bronchus: Secondary | ICD-10-CM

## 2014-12-30 LAB — GLUCOSE, CAPILLARY: GLUCOSE-CAPILLARY: 86 mg/dL (ref 70–99)

## 2014-12-30 MED ORDER — FLUDEOXYGLUCOSE F - 18 (FDG) INJECTION
14.1000 | Freq: Once | INTRAVENOUS | Status: AC | PRN
  Administered 2014-12-30: 14.1 via INTRAVENOUS

## 2015-01-19 ENCOUNTER — Institutional Professional Consult (permissible substitution) (INDEPENDENT_AMBULATORY_CARE_PROVIDER_SITE_OTHER): Payer: Medicare Other | Admitting: Cardiothoracic Surgery

## 2015-01-19 ENCOUNTER — Encounter: Payer: Self-pay | Admitting: Cardiothoracic Surgery

## 2015-01-19 ENCOUNTER — Encounter: Payer: Self-pay | Admitting: *Deleted

## 2015-01-19 VITALS — BP 170/90 | HR 88 | Resp 20 | Ht 71.0 in | Wt 272.0 lb

## 2015-01-19 DIAGNOSIS — R590 Localized enlarged lymph nodes: Secondary | ICD-10-CM | POA: Diagnosis not present

## 2015-01-19 DIAGNOSIS — D381 Neoplasm of uncertain behavior of trachea, bronchus and lung: Secondary | ICD-10-CM

## 2015-01-19 DIAGNOSIS — R918 Other nonspecific abnormal finding of lung field: Secondary | ICD-10-CM | POA: Diagnosis not present

## 2015-01-19 DIAGNOSIS — C8190 Hodgkin lymphoma, unspecified, unspecified site: Secondary | ICD-10-CM | POA: Diagnosis not present

## 2015-01-19 DIAGNOSIS — N2889 Other specified disorders of kidney and ureter: Secondary | ICD-10-CM

## 2015-01-19 NOTE — Patient Instructions (Signed)
Lisinopril stop 36 hours before surgery   Lung Cancer Lung cancer is an abnormal growth of cells in one or both of your lungs. These extra cells may form a mass of tissue called a growth or tumor. Tumors can be either cancerous (malignant) or not cancerous (benign).  Lung cancer is the most common cause of cancer death in men and women. There are several different types of lung cancers. Usually, lung cancer is described as either small cell lung cancer or nonsmall cell lung cancer. Other types of cancer occur in the lungs, including carcinoid and cancers spread from other organs. The types of cancer have different behavior and treatment. RISK FACTORS Smoking is the most common risk factor for developing lung cancer. Other risk factors include:  Radon gas exposure.  Asbestos and other industrial substance exposure.  Second hand tobacco smoke.  Air pollution.  Family or personal history of lung cancer.  Age older than 72 years. CAUSES  Lung cancer usually starts when the lungs are exposed to harmful chemicals. Smoking is the most common risk factor for lung cancer. When you quit smoking, your risk of lung cancer falls each year (but is never the same as a person who has never smoked).  SYMPTOMS  Lung cancer may not have any symptoms in its early stages. The symptoms can depend on the type of cancer, its location, and other factors. Symptoms can include:  Cough (either new, different, or more severe).  Shortness of breath.  Coughing up blood (hemoptysis).  Chest pain.  Hoarseness.  Swelling of the face.  Drooping eyelid.  Changes in blood tests, such as low sodium (hyponatremia), high calcium (hypercalcemia), or low blood count (anemia).  Weight loss. DIAGNOSIS  Your health care provider may suspect lung cancer based on your symptoms or based on tests obtained for other reasons. Tests or procedures used to find or confirm the presence of lung cancer may include:  Chest  X-ray.  CT scan of the lungs and chest.  Blood tests.  Taking a tissue sample (biopsy) from your lung to look for cancer cells. Your cancer will be staged to determine its severity and extent. Staging is a careful attempt to find out the size of the tumor, whether the cancer has spread, and if so, to what parts of the body. You may need to have more tests to determine the stage of your cancer. The test results will help determine what treatment plan is best for you.   Stage 0--This is the earliest stage of lung cancer. In this stage the tumor is present in only a few layers of cells and has not grown beyond the inner lining of the lungs. Stage 0 (carcinoma in situ) is considered noninvasive, meaning at this stage it is not yet capable of spreading to other regions.  Stage I-- The cancer is located only in the lungs and not spread to any lymph nodes.  Stage II--The cancer is in the lungs and the nearby lymph nodes.  Stage III--The cancer is in the lungs and the lymph nodes in the middle of the chest. This is also called locally advanced disease. This stage has two subtypes:  Stage IIIa - The cancer has spread only to lymph nodes on the same side of the chest where the cancer started.  Stage IIIb - The cancer has spread to lymph nodes on the opposite side of the chest or above the collar bone.  Stage IV-- This is the most advanced stage of lung  cancer and is also called advanced disease. This stage describes when the cancer has spread to both lungs, the fluid in the area around the lungs, or to another body part. Your health care provider may tell you the detailed stage of your cancer, which includes both a number and a letter.  TREATMENT  Depending on the type and stage, lung cancer may be treated with surgery, radiation therapy, chemotherapy, or targeted therapy. Some people have a combination of these therapies. Your treatment plan will be developed by your health care team.  [place] not smoke.  Only take over-the-counter or prescription medicines for pain, discomfort, or fever as directed by your health care provider.  Maintain a healthy diet.  Consider joining a support group. This may help you learn to cope with the stress of having lung cancer.  Seek advice to help you manage treatment side effects.  Keep all follow-up appointments as directed by your health care provider.  Inform your cancer specialist if you are admitted to the hospital. [place] IF:   You are losing weight without trying.  You have a persistent cough.  You feel short of breath.  You tire easily. SEEK IMMEDIATE MEDICAL CARE IF:   You cough up clotted blood or bright red blood.  Your pain is not manageable or controlled by medicine.  You develop new difficulty breathing or chest pain.  You develop swelling in one or both ankles or legs, or swelling in your face or neck.  You develop headache or confusion. Document Released: 01/29/2001 Document Revised: 08/13/2013 Document Reviewed: 02/26/2014 [facility] Patient Information 2015 [place], [place]. This information is not intended to replace advice given to you by your health care provider. Make sure you discuss any questions you have with your health care provider.Lung Resection A lung resection is a procedure to remove part or all of a lung. When an entire lung is removed, the procedure is called a pneumonectomy. When only part of a lung is removed, the procedure is called a lobectomy. A lung resection is typically done to get rid of a tumor or cancer, but it may be done to treat other conditions. This procedure can help relieve some or all of your symptoms and can also help keep the problem from getting worse. Lung resection may provide the best chance for curing your disease. However, the procedure may not necessarily cure lung cancer if that is the problem.  LET [facility] CARE PROVIDER KNOW ABOUT:   Any  allergies you have.  All medicines you are taking, including vitamins, herbs, eye drops, creams, and over-the-counter medicines.  Previous problems you or members of your family have had with the use of anesthetics.  Any blood disorders you have.  Previous surgeries you have had.  Medical conditions you have. RISKS AND COMPLICATIONS  Generally, lung resection is a safe procedure. However, problems can occur and include:  Excessive bleeding.  Infection.  Inability to breathe without a ventilator.  Persistent shortness of breath.  Heart problems, including abnormal rhythms and a risk of heart attack or heart failure.  Blood clots.  Injury to a blood vessel.  Injury to a nerve.  Failure to heal properly.  Stroke.  Bronchopleural fistula. This is a small hole between one of the main breathing tubes (bronchus) and the lining of the lungs. This is rare.  Reaction to anesthesia. BEFORE THE PROCEDURE  You may have tests done before the procedure, including:  Blood tests.  Urine tests.  X-rays.  Other imaging tests (such as CT scans, MRI scans, and PET scans). These tests are done to find the exact size and location of the condition being treated with this surgery.  Pulmonary function tests. These are breathing tests to assess the function of your lungs before surgery and to decide how to best help your breathing after surgery.  Heart testing. This is done to make sure your heart is strong enough for the procedure.  Bronchoscopy. This is a technique that allows your health care provider to look at the inside of your airways. This is done using a soft, flexible tube (bronchoscope). Along with imaging tests, this can help your health care provider know the exact location and size of the area that will be removed during surgery.  Lymph node sampling. This may need to be done to see if the tumor has spread. It may be done as a separate surgery or right before your lung  resection procedure. PROCEDURE  An IV tube will be placed in your arm. You will be given a medicine that makes you fall asleep (general anesthetic). You may also get pain medicine through a thin, flexible tube (catheter) in your back.  A breathing tube will be placed in your throat.  Once the surgical team has prepared you for surgery, your surgeon will make an incision on your side. Some resections are done through large incisions, while others can be done through small incisions using smaller instruments and assisted with small cameras (laparoscopic surgery).  Your surgeon will carefully cut the veins, arteries, and bronchus leading to your lung. After being cut, each of these pieces will be sewn or stapled closed. The lung or part of the lung will then be removed.  Your surgeon will check inside your chest to make sure there is no bleeding in or around the lungs. Lymph nodes near the lung may also be removed for later tests.  Your surgeon may put tubes into your chest to drain extra fluid and air after surgery.  Your incision will be closed. This may be done using:  Stitches that absorb into your body and do not need to be removed.  Stitches that must be removed.  Staples that must be removed. AFTER THE PROCEDURE   You will be taken to the recovery area and your progress will be monitored. You may still have a breathing tube and other tubes or catheters in your body immediately after surgery. These will be removed during your recovery. You may be put on a respirator following surgery if some assistance is needed to help your breathing. When you are awake and not experiencing immediate problems from surgery, you will be moved to the intensive care unit (ICU) where you will continue your recovery.  You may feel pain in your chest and throat. Sometimes during recovery, patients may shiver or feel nauseous. You will be given medicine to help with pain and nausea.  The breathing tube will  be taken out as soon as your health care providers feel you can breathe on your own. For most people, this happens on the same day as the surgery.  If your surgery and time in the ICU go well, most of the tubes and equipment will be taken out within 1-2 days after surgery. This is about how long most people stay in the ICU. You may need to stay longer, depending on how you are doing.  You should also start respiratory therapy in the ICU.  This therapy uses breathing exercises to help your other lung stay healthy and get stronger.  As you improve, you will be moved to a regular hospital room for continued respiratory therapy, help with your bladder and bowels, and to continue medicines.  After your lung or part of your lung is taken out, there will be a space inside your chest. This space will often fill up with fluid over time. The amount of time this takes is different for each person.  You will receive care until you are doing well and your health care provider feels it is safe for you to go home or to transfer to an extended care facility. Document Released: 01/13/2003 Document Revised: 03/09/2014 Document Reviewed: 12/12/2013 [facility] Patient Information 2015 [place], [place]. This information is not intended to replace advice given to you by your health care provider. Make sure you discuss any questions you have with your health care provider.   Lung Resection, Care After Refer to this sheet in the next few weeks. These instructions provide you with information on caring for yourself after your procedure. Your health care provider may also give you more specific instructions. Your treatment has been planned according to current medical practices, but problems sometimes occur. Call your health care provider if you have any problems or questions after your procedure. WHAT TO EXPECT AFTER THE PROCEDURE After your procedure, it is typical to have the following:   You may feel pain in your chest  and throat.  Patients may sometimes shiver or feel nauseous during recovery. HOME CARE INSTRUCTIONS  You may resume a normal diet and activities as directed by your health care provider.  Do not use any tobacco products, including cigarettes, chewing tobacco, or electronic cigarettes. If you need help quitting, ask your health care provider.  There are many different ways to close and cover an incision, including stitches, skin glue, and adhesive strips. Follow your health care provider's instructions on:  Incision care.  Bandage (dressing) changes and removal.  Incision closure removal.  Take medicines only as directed by your health care provider.  Keep all follow-up visits as directed by your health care provider. This is important.  Try to breathe deeply and cough as directed. Holding a pillow firmly over your ribs may help with discomfort.  If you were given an incentive spirometer in the hospital, continue to use it as directed by your health care provider.  Walk as directed by your health care provider.  You may take a shower and gently wash the area of your incision with water and soap as directed by your health care provider. Do not use anything else to clean your incision except as directed by your health care provider. Do not take baths, swim, or use a hot tub until your health care provider approves. SEEK MEDICAL CARE IF:  You notice redness, swelling, or increasing pain at the incision site.  You are bleeding at the incision site.  You see pus coming from the incision site.  You notice a bad smell coming from the incision site or bandage.  Your incision breaks open.  You cough up blood or pus, or you develop a cough that produces bad-smelling sputum.  You have pain or swelling in your legs.  You have increasing pain that is not controlled with medicine.  You have trouble managing any of the tubes that have been left in place after surgery.  You have fever  or chills. SEEK IMMEDIATE MEDICAL CARE IF:  You have chest pain or an irregular or rapid heartbeat.  You have dizzy episodes or faint.  You have shortness of breath or difficulty breathing.  You have persistent nausea or vomiting.  You have a rash. Document Released: 05/12/2005 Document Revised: 03/09/2014 Document Reviewed: 12/12/2013 [facility] Patient Information 2015 [place], [place]. This information is not intended to replace advice given to you by your health care provider. Make sure you discuss any questions you have with your health care provider.

## 2015-01-19 NOTE — Progress Notes (Signed)
[name]       [name] [address]             [phone]                    Dakota Shannon Medical Record #[id number] Date of Birth: Aug 06, 1935  Referring: Milus Height, MD Primary Care: [name]., MD  Chief Complaint:    Chief Complaint  Patient presents with  . Lung Mass    Surgical eval, PET Scan 12/30/14, Chest CT 12/11/14, right neck BX 01/07/15    History of Present Illness:    Dakota Shannon 80 y.o. male is seen in the office  today  with a right submandibular lymphadenopathy and a right upper lobe lung mass concerning follow-up primary bronchogenic carcinoma. The  patient underwent a right submandibular lymph node biopsy    suggest low-grade lymphoma versus inflammatory process. Patient denied any B symptoms, stable weight, some depression of a mild, no GU symptoms, but mild constipation intermittently. He denies any pains. He denies any neurological problems.  The patient notes the right cervical adenopathy is been present since 2009. When he saw the dentist in January the oral exam revealed the neck nodes and the patient was encouraged to have further follow-up. This led to a PET scan CT scan of the chest biopsy of lymph node. No evidence of head and neck cancer was noted. The patient is referred for treatment of right upper lobe 2.5 cm lung lesion suspicious for malignancy.    Patient's previous cardiac history: He denies any current anginal type chest pain, denies any previous myocardial infarction, has a history of tachycardia 30 years ago, and his chart this was noted to [facility].   04/06/2010 [facility] Internal Medicine report:  Exercise Cardiolite with no diagnostic ST segment changes to indicate ischemia, 86% of maximum age-predicted heart rate, 4 METS, hypertensive response with blood pressure 186/88. LVEF 76% with normal wall motion. Moderate perfusion defect at the apex, essentially fixed, although described as slightly more  prominent on stress imaging. Study interpreted by Dr. Rayford Halsted to show no significant ischemia, however possible small apical scar versus apical thinning.  04/27/2010 Normal left ventricular chamber size and contraction with LVEF 55-60%, no major valvular abnormalities   WOLFF (WOLFE)-PARKINSON-WHITE (WPW) SYNDROME (ICD-426.7)  Based on history. No reported palpitations.  His updated medication list for this problem includes:  Propranolol Hcl 80 Mg Tabs (Propranolol hcl) .Marland Kitchen... Take 1 tablet by mouth two times a day  Lisinopril 20 Mg Tabs (Lisinopril) .Marland Kitchen... Take 1 tablet by mouth once a day  Ecotrin Low Strength 81 Mg Tbec (Aspirin) .Marland Kitchen... Take 1 tablet by mouth once a day  Isosorbide Mononitrate Cr 30 Mg Xr24h-tab (Isosorbide mononitrate) .Marland Kitchen... Take one tablet by mouth daily Current Activity/ Functional Status:  Patient is independent with mobility/ambulation, transfers, ADL's, IADL's.   Zubrod Score: At the time of surgery this patient's most appropriate activity status/level should be described as: []     0    Normal activity, no symptoms [x]     1    Restricted in physical strenuous activity but ambulatory, able to do out light work []     2    Ambulatory and capable of self care, unable to do work activities, up and about               >50 % of waking hours                              []   3    Only limited self care, in bed greater than 50% of waking hours []     4    Completely disabled, no self care, confined to bed or chair []     5    Moribund   Past Medical History  Diagnosis Date  . Atrial fibrillation     Possible history based on limited information  . Type 2 diabetes mellitus   . Mixed hyperlipidemia   . Essential hypertension, benign   . Wolff-Parkinson-White (WPW) syndrome     Possible history based on limited information  . Arthritis   . Diverticulosis   . Heart murmur   GALL STONES ON CT SCAN  Past surgical history: Only for removal of skin  lesions  Family history: Patient's mother died at age 68 of diabetes, father died at age 79 with lung cancer was a long-term smoker, one son died of accidental drowning, one daughter is alive and with the patient today  History   Social History  . Marital Status: Married    Spouse Name: N/A  . Number of Children: N/A  . Years of Education: N/A   Occupational History  . Retired previously worked in Nurse, adult at [employer] denies any asbestos exposure     Social History Main Topics  . Smoking status: Former Smoker -- 0.50 packs/day for 40 years    Types: Cigarettes    Quit date: 11/07/1979  . Smokeless tobacco: Never Used  . Alcohol Use: No  . Drug Use: No  . Sexual Activity: Not on file      No Known Allergies  Current Outpatient Prescriptions  Medication Sig Dispense Refill  . aspirin EC 81 MG tablet Take 81 mg by mouth daily.      . digoxin (LANOXIN) 0.25 MG tablet Take 250 mcg by mouth daily.      Marland Kitchen gemfibrozil (LOPID) 600 MG tablet Take 600 mg by mouth 2 (two) times daily before a meal.      . HYDROcodone-acetaminophen (VICODIN) 5-500 MG per tablet Take 1 tablet by mouth every 6 (six) hours as needed.      . isosorbide mononitrate (IMDUR) 30 MG 24 hr tablet TAKE 1 TABLET BY MOUTH DAILY - PATIENT NEEDS OFFICE VISIT 15 tablet 0  . lisinopril (PRINIVIL,ZESTRIL) 20 MG tablet Take one tablet by mouth every morning & 1/2 tablet every evening    . Multiple Vitamins-Minerals (CENTRUM SILVER PO) Take 1 tablet by mouth daily.      . propranolol (INDERAL) 80 MG tablet Take 80 mg by mouth 2 (two) times daily.      . tamsulosin (FLOMAX) 0.4 MG CAPS capsule Take 0.4 mg by mouth daily.      No current facility-administered medications for this visit.      Review of Systems:     Cardiac Review of Systems: Y or N  Chest Pain [  N  ]  Resting SOB Aqua.Slicker   ] Exertional SOB  [Y  ]  Orthopnea [ N ]   Pedal Edema [ N  ]    Palpitations [Y  ] Syncope  [ N ]   Presyncope [  N  ]  General Review of Systems: [Y] = yes [  ]=no Constitional: recent weight change [  ];  Wt loss over the last 3 months [   ] anorexia [  ]; fatigue [  ]; nausea [  ]; night sweats [  ]; fever [  ]; or chills [  ];  Dental: poor dentition[  ]; Last Dentist visit:   Eye : blurred vision [  ]; diplopia [   ]; vision changes [  ];  Amaurosis fugax[  ]; Resp: cough [  ];  wheezing[  ];  hemoptysis[  ]; shortness of breath[  ]; paroxysmal nocturnal dyspnea[  ]; dyspnea on exertion[  ]; or orthopnea[  ];  GI:  gallstones[  ], vomiting[  ];  dysphagia[ WHEN EATS TOO FAST ]; melena[  ];  hematochezia [  ]; heartburn[  ];   Hx of  Colonoscopy[  Y]; GU: kidney stones [  ]; hematuria[  ];   dysuria [  ];  nocturia[  ];  history of     obstruction [  ]; urinary frequency [  ]             Skin: rash, swelling[  ];, hair loss[  ];  peripheral edema[  ];  or itching[  ]; Musculosketetal: myalgias[  ];  joint swelling[  ];  joint erythema[  ];  joint pain[  ];  back pain[  ];  Heme/Lymph: bruising[  ];  bleeding[  ];  anemia[  ];  Neuro: TIA[  ];  headaches[  ];  stroke[  ];  vertigo[  ];  seizures[  ];   paresthesias[ N ];  difficulty walking[N  ];  Psych:depression[ N ]; anxiety[  N];  Endocrine: diabetes[  ];  thyroid dysfunction[  ];  Immunizations: Flu up to date [ ? ]; Pneumococcal up to date [?  ];  Other:  Physical Exam: BP 170/90 mmHg  Pulse 88  Resp 20  Ht 5\' 11"  (1.803 m)  Wt 272 lb (123.378 kg)  BMI 37.95 kg/m2  SpO2 98%  PHYSICAL EXAMINATION: General appearance: alert, cooperative and appears older than stated age Head: Normocephalic, without obvious abnormality, atraumatic Neck: moderate anterior cervical adenopathy, no carotid bruit, no JVD, supple, symmetrical, trachea midline and thyroid not enlarged, symmetric, no tenderness/mass/nodules Lymph nodes: Cervical, supraclavicular, and axillary nodes normal. Resp: clear to auscultation bilaterally Back: symmetric, no curvature.  ROM normal. No CVA tenderness. Cardio: regular rate and rhythm, S1, S2 normal, no murmur, click, rub or gallop GI: soft, non-tender; bowel sounds normal; no masses,  no organomegaly Extremities: extremities normal, atraumatic, no cyanosis or edema and Homans sign is negative, no sign of DVT Neurologic: Grossly normal PATIENT HAS NO CAROTID BRUITS, PULSES ARE PALPABLE BILATERALLY, DP AND PT PULSES ARE BILATERALLY PRESENT  Diagnostic Studies & Laboratory data:     Recent Radiology Findings:   Nm Pet Image Initial (pi) Skull Base To Thigh  12/30/2014   CLINICAL DATA:  Initial treatment strategy for lung mass.  EXAM: NUCLEAR MEDICINE PET SKULL BASE TO THIGH  TECHNIQUE: 14.1 mCi F-18 FDG was injected intravenously. Full-ring PET imaging was performed from the skull base to thigh after the radiotracer. CT data was obtained and used for attenuation correction and anatomic localization.  FASTING BLOOD GLUCOSE:  Value: Eighty-six mg/dl  COMPARISON:  CT chest abdomen pelvis 12/18/2014 and CT neck 12/11/2014.  FINDINGS: NECK  Right level 1 and 2 lymph nodes are hypermetabolic. Index right level 1 lymph node measures 2.0 cm in short axis (CT image 44) and has an SUV max of 8.3. CT images show no acute findings.  CHEST  Spiculated nodule in the apical segment right upper lobe measures 1.7 x 2.5 cm with an SUV max of 5.0. No hypermetabolic mediastinal, hilar or axillary adenopathy. CT images show no acute  findings. Right lobe of the thyroid is asymmetrically enlarged and partially calcified. Esophagus is somewhat dilated and contains fluid, suggesting dysmotility. Three-vessel coronary artery calcification. No pericardial or pleural effusion.  ABDOMEN/PELVIS  No abnormal hypermetabolism in the liver or right adrenal gland. Minimal activity is seen within a 1.8 x 2.5 cm lateral limb left adrenal nodule, which measures 10 Hounsfield units. No abnormal hypermetabolism within the spleen or pancreas. No hypermetabolic lymph  nodes. CT images show stones in the gallbladder. Low and high attenuation lesions in the kidneys are again seen, measuring up to 8.0 cm on the left, poorly characterized without post-contrast imaging. Specifically, there is a 2.4 cm hyperdense lesion in the lower pole right kidney. Spleen, pancreas, stomach and bowel are grossly unremarkable. Calcifications are seen in the prostate. No free fluid.  SKELETON  No abnormal osseous hypermetabolism.  IMPRESSION: 1. Hypermetabolic spiculated right upper lobe mass is most indicative of primary bronchogenic carcinoma. 2. Hypermetabolic adenopathy in the right neck is likely due to metastatic disease as a primary head/neck malignancy is not identified. 3. Hyperdense lesion in the lower pole right kidney. Renal cell carcinoma cannot be excluded. Further evaluation with pre and post contrast MRI or CT should be considered. Additional low-attenuation lesions in the kidneys can be further evaluated at that time as well. 4. Three-vessel coronary artery calcification. 5. Left adrenal adenoma. 6. Cholelithiasis.   Electronically Signed   By: Lorin Picket M.D.   On: 12/30/2014 16:00  ADDENDUM REPORT: 01/06/2015 09:07  ADDENDUM: It should be noted that the solid mass described in the kidneys on image number 77 of series 2 lies within the lower pole of the right kidney and not the left kidney.   Electronically Signed By: Inez Catalina M.D. On: 01/06/2015 09:07  CLINICAL DATA: Pulmonary nodule and neck region adenopathy  EXAM: CT CHEST, ABDOMEN, AND PELVIS WITH CONTRAST  TECHNIQUE: Multidetector CT imaging of the chest, abdomen and pelvis was performed following the standard protocol during bolus administration of intravenous contrast.  CONTRAST: 100 mL Isovue 370 nonionic  COMPARISON: CT neck December 11, 2014  FINDINGS: CT CHEST FINDINGS  There is a spiculated nodular lesion in the right apex measuring 2.6 x 2.0 cm. There is a 4 mm nodular  lesion in the anterior segment of the left upper lobe on slice 13 series 5 slightly inferior to the larger lesion. No other parenchymal lung nodular lesions are identified. There is mild scarring in the inferior most aspect of the lingula. There are several subcentimeter mediastinal lymph nodes. To the right of the trachea at the level of the aortic arch, there is a focal lymph node measuring 1.5 x 1.1 cm. No other lymph node prominence is seen.  There is thickening of the right side of the wall of the esophagus from the inferior trachea to a level slightly below the carina. There is moderate dilatation of the esophagus along the more leftward aspect with some wall thickening more diffusely through the distal half of the esophagus.  There are multiple foci of coronary artery calcification.  Thyroid is enlarged with multiple calcifications, primarily on the right. There are no blastic or lytic bone lesions. There is degenerative change in thoracic spine.  CT ABDOMEN AND PELVIS FINDINGS  Liver is prominent measuring 20.9 cm in length. There is hepatic steatosis. No liver masses are identified. Gallbladder is contracted with cholelithiasis. There is no appreciable gallbladder wall thickening. There is no biliary duct dilatation.  Spleen is prominent measuring 16.2 x 14.4  x 8.3 cm. No focal splenic lesions are identified.  Pancreas appears normal. Right adrenal is normal. There is a mass arising from the left adrenal measuring 2.8 x 1.7 cm.  There is a solid mass arising from the anterior lower pole of the left kidney measuring 2.4 x 2.6 cm, best seen on axial slice 77 series 2. There are simple cysts in the right kidney is well, largest measuring 3.2 by 2.0 cm. On the left, there is a dominant cyst laterally measuring 8.2 by 7.8 cm. There is also a cyst more superiorly and posteriorly measuring 3.1 x 2.5 cm. There is no renal hydronephrosis on either side. There is no  renal or ureteral calculus on either side.  In the pelvis, urinary bladder is midline with normal wall thickness. The prostate is enlarged with multiple prostatic calculi. There is no pelvic mass or fluid collection. There are multiple sigmoid diverticula without diverticulitis. Appendix appears normal.  There is no bowel obstruction. No free air or portal venous air. There is no ascites, adenopathy, or abscess in the abdomen or pelvis. There is atherosclerotic change in aorta the no aneurysm. There is degenerative change in the lumbar spine. There are no blastic or lytic bone lesions.  IMPRESSION: CT chest: Spiculated mass in the right apex measuring 2.6 x 2.0 cm, suspicious for lung carcinoma. 4 mm nearby lesion could represent a small metastasis.  Single borderline prominent lymph node in the right paratracheal region.  Abnormal appearance the esophagus with the eccentric wall thickening along the rightward aspect of the midesophagus with dilatation with a air on the leftward aspect and more generalized thickening in the distal half of the esophagus. This finding may well warrant direct visualization to assess for possible esophageal neoplastic lesion.  Enlarged thyroid with multiple nodular lesions as well as extensive calcification, particular on the right. Suspect multinodular goiter, although an underlying thyroid neoplasm cannot be excluded on the right.  Extensive atherosclerotic calcification.  CT abdomen and pelvis: There is a left adrenal mass concerning for metastatic focus. There is a solid left renal mass concerning for renal cell carcinoma. It would be unusual for the other lesions to be associated with renal cell carcinoma as opposed to lung carcinoma, although both entities certainly may exist concurrently. Further evaluation with pre and post contrast MRI or CT should be considered. MRI is preferred in younger patients (due to lack of ionizing  radiation) and for evaluating calcified lesion(s).  Enlarged prostate with multiple prostatic calculi.  Multiple sigmoid diverticula.  No adenopathy is seen in the abdomen or pelvis. No focal liver lesions are identified, although the liver and spleen are prominent, and there is hepatic steatosis.  Cholelithiasis.  Electronically Signed: By: Lowella Grip III M.D. On: 12/18/2014 15:03   I have independently reviewed the above radiologic studies.  Recent Lab Findings: No results found for: WBC, HGB, HCT, PLT, GLUCOSE, CHOL, TRIG, HDL, LDLDIRECT, LDLCALC, ALT, AST, NA, K, CL, CREATININE, BUN, CO2, TSH, INR, GLUF, HGBA1C    Assessment / Plan:   1. Hypermetabolic spiculated right upper lobe mass is most indicative of primary bronchogenic carcinoma. The PET scan and CAT scan are highly suspicious for clinical stage I carcinoma of the lung. I recommend to the patient that we proceed with bronchoscopy right video-assisted thoracoscopy and lung resection to obtain a tissue diagnosis and treatment. He would like to proceed with this will tentatively arrange for Tuesday, March 22. Prior to surgery we'll obtain pulmonary function studies. He recently saw Dr.  Domenic Polite, who I will contact him for his review and see if he recommends any further preoperative studies than what was done and January. Wrist and options of the procedure were discussed with the patient and his daughter in detail and her questions were answered.   2. 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 Hyperdense lesion in the lower pole right kidney. Renal cell carcinoma possible- will need further workup  4. Three-vessel coronary artery calcification ON CT SCAN  No ischemic symptoms 5. Left adrenal adenoma.  6. Cholelithiasis.-Asymptomatic       I  spent 39minutes counseling the patient face to face and 50% or more the  time was spent in counseling and coordination of care. The total time spent  in the appointment was 60 minutes.  Grace Isaac MD      [address] [place] [address] Office [phone]   Beeper [phone]  01/19/2015 3:04 PM

## 2015-01-20 ENCOUNTER — Other Ambulatory Visit: Payer: Self-pay | Admitting: *Deleted

## 2015-01-20 DIAGNOSIS — J984 Other disorders of lung: Secondary | ICD-10-CM

## 2015-01-22 ENCOUNTER — Other Ambulatory Visit (HOSPITAL_COMMUNITY): Payer: Medicare Other

## 2015-01-22 ENCOUNTER — Encounter (HOSPITAL_COMMUNITY)
Admission: RE | Admit: 2015-01-22 | Discharge: 2015-01-22 | Disposition: A | Discharge status: Home / Self Care | Payer: Medicare Other | Source: Ambulatory Visit | Attending: Cardiothoracic Surgery | Admitting: Cardiothoracic Surgery

## 2015-01-22 ENCOUNTER — Other Ambulatory Visit: Payer: Self-pay

## 2015-01-22 ENCOUNTER — Encounter (HOSPITAL_COMMUNITY): Payer: Self-pay

## 2015-01-22 ENCOUNTER — Ambulatory Visit (HOSPITAL_COMMUNITY)
Admission: RE | Admit: 2015-01-22 | Discharge: 2015-01-22 | Disposition: A | Discharge status: Home / Self Care | Payer: Medicare Other | Source: Ambulatory Visit | Attending: Cardiothoracic Surgery | Admitting: Cardiothoracic Surgery

## 2015-01-22 VITALS — BP 162/76 | HR 58 | Temp 98.8°F | Resp 20 | Ht 71.0 in | Wt 267.4 lb

## 2015-01-22 DIAGNOSIS — Z79899 Other long term (current) drug therapy: Secondary | ICD-10-CM | POA: Insufficient documentation

## 2015-01-22 DIAGNOSIS — J984 Other disorders of lung: Secondary | ICD-10-CM

## 2015-01-22 DIAGNOSIS — E119 Type 2 diabetes mellitus without complications: Secondary | ICD-10-CM | POA: Insufficient documentation

## 2015-01-22 DIAGNOSIS — Z0183 Encounter for blood typing: Secondary | ICD-10-CM | POA: Insufficient documentation

## 2015-01-22 DIAGNOSIS — Z01812 Encounter for preprocedural laboratory examination: Secondary | ICD-10-CM | POA: Insufficient documentation

## 2015-01-22 DIAGNOSIS — E782 Mixed hyperlipidemia: Secondary | ICD-10-CM | POA: Insufficient documentation

## 2015-01-22 DIAGNOSIS — Z01818 Encounter for other preprocedural examination: Secondary | ICD-10-CM | POA: Diagnosis not present

## 2015-01-22 DIAGNOSIS — R918 Other nonspecific abnormal finding of lung field: Secondary | ICD-10-CM | POA: Insufficient documentation

## 2015-01-22 DIAGNOSIS — I10 Essential (primary) hypertension: Secondary | ICD-10-CM | POA: Diagnosis not present

## 2015-01-22 DIAGNOSIS — Z7982 Long term (current) use of aspirin: Secondary | ICD-10-CM | POA: Diagnosis not present

## 2015-01-22 DIAGNOSIS — Z87891 Personal history of nicotine dependence: Secondary | ICD-10-CM | POA: Insufficient documentation

## 2015-01-22 HISTORY — DX: Hodgkin lymphoma, unspecified, unspecified site: C81.90

## 2015-01-22 HISTORY — DX: Dysphagia, unspecified: R13.10

## 2015-01-22 HISTORY — DX: Other complications of anesthesia, initial encounter: T88.59XA

## 2015-01-22 HISTORY — DX: Adverse effect of unspecified anesthetic, initial encounter: T41.45XA

## 2015-01-22 LAB — URINALYSIS, ROUTINE W REFLEX MICROSCOPIC
Bilirubin Urine: NEGATIVE
Glucose, UA: NEGATIVE mg/dL
Hgb urine dipstick: NEGATIVE
Ketones, ur: NEGATIVE mg/dL
Leukocytes, UA: NEGATIVE
Nitrite: NEGATIVE
Protein, ur: NEGATIVE mg/dL
Specific Gravity, Urine: 1.016 (ref 1.005–1.030)
Urobilinogen, UA: 0.2 mg/dL (ref 0.0–1.0)
pH: 5.5 (ref 5.0–8.0)

## 2015-01-22 LAB — BLOOD GAS, ARTERIAL
Acid-base deficit: 4.6 mmol/L — ABNORMAL HIGH (ref 0.0–2.0)
Bicarbonate: 19.5 mEq/L — ABNORMAL LOW (ref 20.0–24.0)
Drawn by: 421801
FIO2: 0.21 %
O2 Saturation: 95.1 %
Patient temperature: 98.6
TCO2: 20.6 mmol/L (ref 0–100)
pCO2 arterial: 33.3 mmHg — ABNORMAL LOW (ref 35.0–45.0)
pH, Arterial: 7.386 (ref 7.350–7.450)
pO2, Arterial: 77.5 mmHg — ABNORMAL LOW (ref 80.0–100.0)

## 2015-01-22 LAB — PROTIME-INR
INR: 1.05 (ref 0.00–1.49)
Prothrombin Time: 13.8 seconds (ref 11.6–15.2)

## 2015-01-22 LAB — PULMONARY FUNCTION TEST
DL/VA % pred: 132 %
DL/VA: 5.81 ml/min/mmHg/L
DLCO unc % pred: 132 %
DLCO unc: 37.38 ml/min/mmHg
FEF 25-75 Post: 0.87 L/sec
FEF 25-75 Pre: 0.89 L/sec
FEF2575-%Change-Post: -3 %
FEF2575-%Pred-Post: 47 %
FEF2575-%Pred-Pre: 49 %
FEV1-%Change-Post: 6 %
FEV1-%Pred-Post: 72 %
FEV1-%Pred-Pre: 68 %
FEV1-Post: 1.88 L
FEV1-Pre: 1.76 L
FEV1FVC-%Change-Post: 8 %
FEV1FVC-%Pred-Pre: 81 %
FEV6-%Change-Post: -3 %
FEV6-%Pred-Post: 83 %
FEV6-%Pred-Pre: 87 %
FEV6-Post: 2.83 L
FEV6-Pre: 2.94 L
FEV6FVC-%Change-Post: -1 %
FEV6FVC-%Pred-Post: 104 %
FEV6FVC-%Pred-Pre: 105 %
FVC-%Change-Post: -1 %
FVC-%Pred-Post: 81 %
FVC-%Pred-Pre: 82 %
FVC-Post: 2.95 L
FVC-Pre: 3 L
Post FEV1/FVC ratio: 64 %
Post FEV6/FVC ratio: 97 %
Pre FEV1/FVC ratio: 59 %
Pre FEV6/FVC Ratio: 98 %

## 2015-01-22 LAB — CBC
HCT: 40.8 % (ref 39.0–52.0)
Hemoglobin: 13.4 g/dL (ref 13.0–17.0)
MCH: 28.6 pg (ref 26.0–34.0)
MCHC: 32.8 g/dL (ref 30.0–36.0)
MCV: 87 fL (ref 78.0–100.0)
Platelets: 183 10*3/uL (ref 150–400)
RBC: 4.69 MIL/uL (ref 4.22–5.81)
RDW: 13.8 % (ref 11.5–15.5)
WBC: 4.5 10*3/uL (ref 4.0–10.5)

## 2015-01-22 LAB — ABO/RH: ABO/RH(D): O POS

## 2015-01-22 LAB — COMPREHENSIVE METABOLIC PANEL
ALT: 17 U/L (ref 0–53)
AST: 22 U/L (ref 0–37)
Albumin: 4.1 g/dL (ref 3.5–5.2)
Alkaline Phosphatase: 73 U/L (ref 39–117)
Anion gap: 11 (ref 5–15)
BUN: 20 mg/dL (ref 6–23)
CO2: 18 mmol/L — ABNORMAL LOW (ref 19–32)
Calcium: 9.3 mg/dL (ref 8.4–10.5)
Chloride: 111 mmol/L (ref 96–112)
Creatinine, Ser: 1.15 mg/dL (ref 0.50–1.35)
GFR calc Af Amer: 69 mL/min — ABNORMAL LOW (ref >90)
GFR calc non Af Amer: 60 mL/min — ABNORMAL LOW (ref >90)
Glucose, Bld: 95 mg/dL (ref 70–99)
Potassium: 4.4 mmol/L (ref 3.5–5.1)
Sodium: 140 mmol/L (ref 135–145)
Total Bilirubin: 1.1 mg/dL (ref 0.3–1.2)
Total Protein: 7.1 g/dL (ref 6.0–8.3)

## 2015-01-22 LAB — TYPE AND SCREEN
ABO/RH(D): O POS
Antibody Screen: NEGATIVE

## 2015-01-22 LAB — SURGICAL PCR SCREEN
MRSA, PCR: NEGATIVE
Staphylococcus aureus: NEGATIVE

## 2015-01-22 LAB — APTT: aPTT: 31 seconds (ref 24–37)

## 2015-01-22 MED ORDER — ALBUTEROL SULFATE (2.5 MG/3ML) 0.083% IN NEBU
2.5000 mg | INHALATION_SOLUTION | Freq: Once | RESPIRATORY_TRACT | Status: AC
  Administered 2015-01-22: 2.5 mg via RESPIRATORY_TRACT

## 2015-01-22 NOTE — Progress Notes (Signed)
   01/22/15 1052  OBSTRUCTIVE SLEEP APNEA  Have you ever been diagnosed with sleep apnea through a sleep study? No  Do you snore loudly (loud enough to be heard through closed doors)?  0  Do you often feel tired, fatigued, or sleepy during the daytime? 0  Has anyone observed you stop breathing during your sleep? 0  Do you have, or are you being treated for high blood pressure? 1  BMI more than 35 kg/m2? 1  Age over 78 years old? 1  Neck circumference greater than 40 cm/16 inches? 1  Gender: 1

## 2015-01-22 NOTE — Pre-Procedure Instructions (Signed)
RAWLEIGH RODE  01/22/2015   Your procedure is scheduled on:  Tuesday 01/26/15  Report to [facility] Admitting at 530 AM.  Call this number if you have problems the morning of surgery: [phone]   Remember:   Do not eat food or drink liquids after midnight.   Take these medicines the morning of surgery with A SIP OF WATER:  DIGOXIN (LANOXIN), HYDROCODONE IF NEEDED, ISOSORBIDE MONONITRATE (IMDUR), PROPANOLOL(INDERAL), TAMSILOSIN (FLOMAX)   Do not wear jewelry, make-up or nail polish.  Do not wear lotions, powders, or perfumes. You may wear deodorant.  Do not shave 48 hours prior to surgery. Men may shave face and neck.  Do not bring valuables to the hospital.  [facility] is not responsible                  for any belongings or valuables.               Contacts, dentures or bridgework may not be worn into surgery.  Leave suitcase in the car. After surgery it may be brought to your room.  For patients admitted to the hospital, discharge time is determined by your                treatment team.               Patients discharged the day of surgery will not be allowed to drive  home.  Name and phone number of your driver:   Special Instructions: [place] - Preparing for Surgery  Before surgery, you can play an important role.  Because skin is not sterile, your skin needs to be as free of germs as possible.  You can reduce the number of germs on you skin by washing with CHG (chlorahexidine gluconate) soap before surgery.  CHG is an antiseptic cleaner which kills germs and bonds with the skin to continue killing germs even after washing.  Please DO NOT use if you have an allergy to CHG or antibacterial soaps.  If your skin becomes reddened/irritated stop using the CHG and inform your nurse when you arrive at Short Stay.  Do not shave (including legs and underarms) for at least 48 hours prior to the first CHG shower.  You may shave your face.  Please follow these  instructions carefully:   1.  Shower with CHG Soap the night before surgery and the                                morning of Surgery.  2.  If you choose to wash your hair, wash your hair first as usual with your       normal shampoo.  3.  After you shampoo, rinse your hair and body thoroughly to remove the                      Shampoo.  4.  Use CHG as you would any other liquid soap.  You can apply chg directly       to the skin and wash gently with scrungie or a clean washcloth.  5.  Apply the CHG Soap to your body ONLY FROM THE NECK DOWN.        Do not use on open wounds or open sores.  Avoid contact with your eyes,       ears, mouth and genitals (private parts).  Wash genitals (  private parts)       with your normal soap.  6.  Wash thoroughly, paying special attention to the area where your surgery        will be performed.  7.  Thoroughly rinse your body with warm water from the neck down.  8.  DO NOT shower/wash with your normal soap after using and rinsing off       the CHG Soap.  9.  Pat yourself dry with a clean towel.            10.  Wear clean pajamas.            11.  Place clean sheets on your bed the night of your first shower and do not        sleep with pets.  Day of Surgery  Do not apply any lotions/deoderants the morning of surgery.  Please wear clean clothes to the hospital/surgery center.     Please read over the following fact sheets that you were given: Pain Booklet, Coughing and Deep Breathing, Blood Transfusion Information, Total Joint Packet, MRSA Information and Surgical Site Infection Prevention

## 2015-01-25 MED ORDER — DEXTROSE 5 % IV SOLN
1.5000 g | INTRAVENOUS | Status: AC
  Administered 2015-01-26: 1.5 g via INTRAVENOUS
  Filled 2015-01-25: qty 1.5

## 2015-01-26 ENCOUNTER — Inpatient Hospital Stay (HOSPITAL_COMMUNITY): Payer: Medicare Other | Admitting: Anesthesiology

## 2015-01-26 ENCOUNTER — Encounter (HOSPITAL_COMMUNITY): Payer: Self-pay | Admitting: Surgery

## 2015-01-26 ENCOUNTER — Inpatient Hospital Stay (HOSPITAL_COMMUNITY): Payer: Medicare Other

## 2015-01-26 ENCOUNTER — Inpatient Hospital Stay (HOSPITAL_COMMUNITY)
Admission: RE | Admit: 2015-01-26 | Discharge: 2015-01-31 | DRG: 164 | Disposition: A | Discharge status: Home / Self Care | Payer: Medicare Other | Source: Ambulatory Visit | Attending: Cardiothoracic Surgery | Admitting: Cardiothoracic Surgery

## 2015-01-26 ENCOUNTER — Encounter (HOSPITAL_COMMUNITY)
Admission: RE | Disposition: A | Discharge status: Home / Self Care | Payer: Self-pay | Source: Ambulatory Visit | Attending: Cardiothoracic Surgery

## 2015-01-26 DIAGNOSIS — N400 Enlarged prostate without lower urinary tract symptoms: Secondary | ICD-10-CM | POA: Diagnosis present

## 2015-01-26 DIAGNOSIS — I10 Essential (primary) hypertension: Secondary | ICD-10-CM | POA: Diagnosis present

## 2015-01-26 DIAGNOSIS — Z09 Encounter for follow-up examination after completed treatment for conditions other than malignant neoplasm: Secondary | ICD-10-CM

## 2015-01-26 DIAGNOSIS — D3502 Benign neoplasm of left adrenal gland: Secondary | ICD-10-CM | POA: Diagnosis present

## 2015-01-26 DIAGNOSIS — Z801 Family history of malignant neoplasm of trachea, bronchus and lung: Secondary | ICD-10-CM

## 2015-01-26 DIAGNOSIS — N281 Cyst of kidney, acquired: Secondary | ICD-10-CM | POA: Diagnosis present

## 2015-01-26 DIAGNOSIS — Z87891 Personal history of nicotine dependence: Secondary | ICD-10-CM

## 2015-01-26 DIAGNOSIS — N420 Calculus of prostate: Secondary | ICD-10-CM | POA: Diagnosis present

## 2015-01-26 DIAGNOSIS — K8020 Calculus of gallbladder without cholecystitis without obstruction: Secondary | ICD-10-CM | POA: Diagnosis present

## 2015-01-26 DIAGNOSIS — R918 Other nonspecific abnormal finding of lung field: Secondary | ICD-10-CM | POA: Diagnosis present

## 2015-01-26 DIAGNOSIS — K5730 Diverticulosis of large intestine without perforation or abscess without bleeding: Secondary | ICD-10-CM | POA: Diagnosis present

## 2015-01-26 DIAGNOSIS — C3410 Malignant neoplasm of upper lobe, unspecified bronchus or lung: Secondary | ICD-10-CM | POA: Diagnosis present

## 2015-01-26 DIAGNOSIS — E782 Mixed hyperlipidemia: Secondary | ICD-10-CM | POA: Diagnosis present

## 2015-01-26 DIAGNOSIS — C3411 Malignant neoplasm of upper lobe, right bronchus or lung: Principal | ICD-10-CM | POA: Diagnosis present

## 2015-01-26 DIAGNOSIS — J9382 Other air leak: Secondary | ICD-10-CM | POA: Diagnosis not present

## 2015-01-26 DIAGNOSIS — R339 Retention of urine, unspecified: Secondary | ICD-10-CM | POA: Diagnosis not present

## 2015-01-26 DIAGNOSIS — K760 Fatty (change of) liver, not elsewhere classified: Secondary | ICD-10-CM | POA: Diagnosis present

## 2015-01-26 DIAGNOSIS — Z6837 Body mass index (BMI) 37.0-37.9, adult: Secondary | ICD-10-CM | POA: Diagnosis not present

## 2015-01-26 DIAGNOSIS — E279 Disorder of adrenal gland, unspecified: Secondary | ICD-10-CM | POA: Diagnosis present

## 2015-01-26 DIAGNOSIS — M1990 Unspecified osteoarthritis, unspecified site: Secondary | ICD-10-CM | POA: Diagnosis present

## 2015-01-26 DIAGNOSIS — C8190 Hodgkin lymphoma, unspecified, unspecified site: Secondary | ICD-10-CM | POA: Diagnosis present

## 2015-01-26 DIAGNOSIS — E669 Obesity, unspecified: Secondary | ICD-10-CM | POA: Diagnosis present

## 2015-01-26 DIAGNOSIS — J984 Other disorders of lung: Secondary | ICD-10-CM

## 2015-01-26 DIAGNOSIS — Z902 Acquired absence of lung [part of]: Secondary | ICD-10-CM

## 2015-01-26 DIAGNOSIS — R222 Localized swelling, mass and lump, trunk: Secondary | ICD-10-CM

## 2015-01-26 DIAGNOSIS — I2510 Atherosclerotic heart disease of native coronary artery without angina pectoris: Secondary | ICD-10-CM | POA: Diagnosis present

## 2015-01-26 HISTORY — PX: VIDEO ASSISTED THORACOSCOPY (VATS)/WEDGE RESECTION: SHX6174

## 2015-01-26 HISTORY — PX: VIDEO BRONCHOSCOPY: SHX5072

## 2015-01-26 SURGERY — BRONCHOSCOPY, VIDEO-ASSISTED
Anesthesia: General | Site: Mouth | Laterality: Right

## 2015-01-26 MED ORDER — ONDANSETRON HCL 4 MG/2ML IJ SOLN: 4.0000 mg | Freq: Four times a day (QID) | INTRAMUSCULAR | Status: DC | PRN

## 2015-01-26 MED ORDER — ACETAMINOPHEN 325 MG PO TABS
ORAL_TABLET | ORAL | Status: AC
  Filled 2015-01-26: qty 3

## 2015-01-26 MED ORDER — LIDOCAINE HCL (CARDIAC) 20 MG/ML IV SOLN
INTRAVENOUS | Status: AC
  Filled 2015-01-26: qty 5

## 2015-01-26 MED ORDER — MIDAZOLAM HCL 5 MG/5ML IJ SOLN
INTRAMUSCULAR | Status: DC | PRN
  Administered 2015-01-26 (×2): 1 mg via INTRAVENOUS

## 2015-01-26 MED ORDER — DIPHENHYDRAMINE HCL 12.5 MG/5ML PO ELIX
12.5000 mg | ORAL_SOLUTION | Freq: Four times a day (QID) | ORAL | Status: DC | PRN
  Filled 2015-01-26: qty 5

## 2015-01-26 MED ORDER — FENTANYL CITRATE 0.05 MG/ML IJ SOLN
INTRAMUSCULAR | Status: DC | PRN
  Administered 2015-01-26: 25 ug via INTRAVENOUS
  Administered 2015-01-26: 50 ug via INTRAVENOUS
  Administered 2015-01-26: 100 ug via INTRAVENOUS
  Administered 2015-01-26: 25 ug via INTRAVENOUS
  Administered 2015-01-26: 50 ug via INTRAVENOUS

## 2015-01-26 MED ORDER — HYDROMORPHONE HCL 1 MG/ML IJ SOLN
INTRAMUSCULAR | Status: DC | PRN
  Administered 2015-01-26 (×2): .2 mg via INTRAVENOUS

## 2015-01-26 MED ORDER — GLYCOPYRROLATE 0.2 MG/ML IJ SOLN
INTRAMUSCULAR | Status: AC
  Filled 2015-01-26: qty 1

## 2015-01-26 MED ORDER — MIDAZOLAM HCL 2 MG/2ML IJ SOLN
INTRAMUSCULAR | Status: AC
  Filled 2015-01-26: qty 2

## 2015-01-26 MED ORDER — HYDROMORPHONE HCL 1 MG/ML IJ SOLN
INTRAMUSCULAR | Status: AC
  Administered 2015-01-26: 0.5 mg via INTRAVENOUS
  Filled 2015-01-26: qty 2

## 2015-01-26 MED ORDER — LACTATED RINGERS IV SOLN
INTRAVENOUS | Status: DC | PRN
  Administered 2015-01-26: 07:00:00 via INTRAVENOUS

## 2015-01-26 MED ORDER — KCL IN DEXTROSE-NACL 20-5-0.45 MEQ/L-%-% IV SOLN
INTRAVENOUS | Status: AC
  Filled 2015-01-26: qty 1000

## 2015-01-26 MED ORDER — DEXTROSE 5 % IV SOLN
1.5000 g | Freq: Two times a day (BID) | INTRAVENOUS | Status: AC
  Administered 2015-01-26 – 2015-01-27 (×2): 1.5 g via INTRAVENOUS
  Filled 2015-01-26 (×2): qty 1.5

## 2015-01-26 MED ORDER — DIGOXIN 250 MCG PO TABS
250.0000 ug | ORAL_TABLET | Freq: Every day | ORAL | Status: DC
  Administered 2015-01-27 – 2015-01-31 (×5): 250 ug via ORAL
  Filled 2015-01-26 (×7): qty 1

## 2015-01-26 MED ORDER — SENNOSIDES-DOCUSATE SODIUM 8.6-50 MG PO TABS
1.0000 | ORAL_TABLET | Freq: Every day | ORAL | Status: DC
  Administered 2015-01-26 – 2015-01-30 (×5): 1 via ORAL
  Filled 2015-01-26 (×6): qty 1

## 2015-01-26 MED ORDER — VECURONIUM BROMIDE 10 MG IV SOLR
INTRAVENOUS | Status: AC
  Filled 2015-01-26: qty 10

## 2015-01-26 MED ORDER — PROPRANOLOL HCL 80 MG PO TABS
80.0000 mg | ORAL_TABLET | Freq: Two times a day (BID) | ORAL | Status: DC
  Administered 2015-01-26 – 2015-01-31 (×8): 80 mg via ORAL
  Filled 2015-01-26 (×11): qty 1

## 2015-01-26 MED ORDER — GEMFIBROZIL 600 MG PO TABS
600.0000 mg | ORAL_TABLET | Freq: Two times a day (BID) | ORAL | Status: DC
  Administered 2015-01-26 – 2015-01-31 (×10): 600 mg via ORAL
  Filled 2015-01-26 (×12): qty 1

## 2015-01-26 MED ORDER — TAMSULOSIN HCL 0.4 MG PO CAPS
0.4000 mg | ORAL_CAPSULE | Freq: Every day | ORAL | Status: DC
  Administered 2015-01-26 – 2015-01-31 (×6): 0.4 mg via ORAL
  Filled 2015-01-26 (×7): qty 1

## 2015-01-26 MED ORDER — KCL IN DEXTROSE-NACL 20-5-0.45 MEQ/L-%-% IV SOLN
INTRAVENOUS | Status: DC
  Administered 2015-01-26 (×2): via INTRAVENOUS
  Filled 2015-01-26 (×5): qty 1000

## 2015-01-26 MED ORDER — OXYCODONE HCL 5 MG PO TABS
5.0000 mg | ORAL_TABLET | ORAL | Status: DC | PRN
  Administered 2015-01-26 – 2015-01-28 (×4): 10 mg via ORAL
  Filled 2015-01-26 (×4): qty 2

## 2015-01-26 MED ORDER — ROCURONIUM BROMIDE 100 MG/10ML IV SOLN
INTRAVENOUS | Status: DC | PRN
  Administered 2015-01-26: 50 mg via INTRAVENOUS

## 2015-01-26 MED ORDER — GLYCOPYRROLATE 0.2 MG/ML IJ SOLN
INTRAMUSCULAR | Status: DC | PRN
  Administered 2015-01-26: 0.6 mg via INTRAVENOUS
  Administered 2015-01-26: 0.2 mg via INTRAVENOUS

## 2015-01-26 MED ORDER — ISOSORBIDE MONONITRATE ER 30 MG PO TB24
30.0000 mg | ORAL_TABLET | Freq: Every day | ORAL | Status: DC
  Administered 2015-01-27 – 2015-01-30 (×5): 30 mg via ORAL
  Filled 2015-01-26 (×7): qty 1

## 2015-01-26 MED ORDER — SUCCINYLCHOLINE CHLORIDE 20 MG/ML IJ SOLN
INTRAMUSCULAR | Status: AC
  Filled 2015-01-26: qty 1

## 2015-01-26 MED ORDER — OXYCODONE HCL 5 MG PO TABS: 5.0000 mg | ORAL_TABLET | Freq: Once | ORAL | Status: DC | PRN

## 2015-01-26 MED ORDER — NALOXONE HCL 0.4 MG/ML IJ SOLN: 0.4000 mg | INTRAMUSCULAR | Status: DC | PRN

## 2015-01-26 MED ORDER — EPHEDRINE SULFATE 50 MG/ML IJ SOLN
INTRAMUSCULAR | Status: AC
  Filled 2015-01-26: qty 1

## 2015-01-26 MED ORDER — OXYCODONE HCL 5 MG/5ML PO SOLN: 5.0000 mg | Freq: Once | ORAL | Status: DC | PRN

## 2015-01-26 MED ORDER — ONDANSETRON HCL 4 MG/2ML IJ SOLN
INTRAMUSCULAR | Status: AC
  Filled 2015-01-26: qty 2

## 2015-01-26 MED ORDER — DIPHENHYDRAMINE HCL 50 MG/ML IJ SOLN: 12.5000 mg | Freq: Four times a day (QID) | INTRAMUSCULAR | Status: DC | PRN

## 2015-01-26 MED ORDER — ACETAMINOPHEN 160 MG/5ML PO SOLN
1000.0000 mg | Freq: Four times a day (QID) | ORAL | Status: DC
  Administered 2015-01-26 – 2015-01-27 (×4): 1000 mg via ORAL
  Filled 2015-01-26 (×19): qty 40

## 2015-01-26 MED ORDER — SODIUM CHLORIDE 0.9 % IJ SOLN: 9.0000 mL | INTRAMUSCULAR | Status: DC | PRN

## 2015-01-26 MED ORDER — POTASSIUM CHLORIDE 10 MEQ/50ML IV SOLN: 10.0000 meq | Freq: Every day | INTRAVENOUS | Status: DC | PRN

## 2015-01-26 MED ORDER — NEOSTIGMINE METHYLSULFATE 10 MG/10ML IV SOLN
INTRAVENOUS | Status: DC | PRN
  Administered 2015-01-26: 4 mg via INTRAVENOUS

## 2015-01-26 MED ORDER — PROPOFOL 10 MG/ML IV BOLUS
INTRAVENOUS | Status: DC | PRN
  Administered 2015-01-26: 150 mg via INTRAVENOUS

## 2015-01-26 MED ORDER — 0.9 % SODIUM CHLORIDE (POUR BTL) OPTIME
TOPICAL | Status: DC | PRN
  Administered 2015-01-26: 3000 mL

## 2015-01-26 MED ORDER — PROPOFOL 10 MG/ML IV BOLUS
INTRAVENOUS | Status: AC
  Filled 2015-01-26: qty 20

## 2015-01-26 MED ORDER — PHENYLEPHRINE HCL 10 MG/ML IJ SOLN
10.0000 mg | INTRAVENOUS | Status: DC | PRN
  Administered 2015-01-26: 40 ug/min via INTRAVENOUS

## 2015-01-26 MED ORDER — ACETAMINOPHEN 500 MG PO TABS
1000.0000 mg | ORAL_TABLET | Freq: Four times a day (QID) | ORAL | Status: DC
  Administered 2015-01-26: 975 mg via ORAL
  Administered 2015-01-26 – 2015-01-31 (×14): 1000 mg via ORAL
  Filled 2015-01-26 (×18): qty 2

## 2015-01-26 MED ORDER — FENTANYL 10 MCG/ML IV SOLN
INTRAVENOUS | Status: DC
  Administered 2015-01-26: 60 ug via INTRAVENOUS
  Administered 2015-01-26: 30 ug via INTRAVENOUS
  Administered 2015-01-26: 170 ug via INTRAVENOUS
  Administered 2015-01-26: 11:00:00 via INTRAVENOUS
  Administered 2015-01-27: 30 ug via INTRAVENOUS
  Administered 2015-01-27: 40 ug via INTRAVENOUS
  Administered 2015-01-27: 130 ug via INTRAVENOUS
  Administered 2015-01-27: 50 ug via INTRAVENOUS
  Administered 2015-01-27: 110 ug via INTRAVENOUS
  Administered 2015-01-28: 80 ug via INTRAVENOUS
  Administered 2015-01-28: 70 ug via INTRAVENOUS
  Administered 2015-01-28: 60 ug via INTRAVENOUS
  Administered 2015-01-28: 70 ug via INTRAVENOUS
  Administered 2015-01-28: 120 ug via INTRAVENOUS
  Administered 2015-01-28: 50 ug via INTRAVENOUS
  Administered 2015-01-28: 20 ug via INTRAVENOUS
  Administered 2015-01-29: 30 ug via INTRAVENOUS
  Filled 2015-01-26 (×3): qty 50

## 2015-01-26 MED ORDER — ASPIRIN EC 81 MG PO TBEC
81.0000 mg | DELAYED_RELEASE_TABLET | Freq: Every day | ORAL | Status: DC
  Administered 2015-01-26 – 2015-01-31 (×6): 81 mg via ORAL
  Filled 2015-01-26 (×7): qty 1

## 2015-01-26 MED ORDER — BISACODYL 5 MG PO TBEC
10.0000 mg | DELAYED_RELEASE_TABLET | Freq: Every day | ORAL | Status: DC
  Administered 2015-01-27 – 2015-01-30 (×4): 10 mg via ORAL
  Filled 2015-01-26 (×4): qty 2

## 2015-01-26 MED ORDER — SODIUM CHLORIDE 0.9 % IJ SOLN
INTRAMUSCULAR | Status: AC
  Filled 2015-01-26: qty 10

## 2015-01-26 MED ORDER — NEOSTIGMINE METHYLSULFATE 10 MG/10ML IV SOLN
INTRAVENOUS | Status: AC
  Filled 2015-01-26: qty 1

## 2015-01-26 MED ORDER — TRAMADOL HCL 50 MG PO TABS: 50.0000 mg | ORAL_TABLET | Freq: Four times a day (QID) | ORAL | Status: DC | PRN

## 2015-01-26 MED ORDER — GLYCOPYRROLATE 0.2 MG/ML IJ SOLN
INTRAMUSCULAR | Status: AC
  Filled 2015-01-26: qty 2

## 2015-01-26 MED ORDER — STERILE WATER FOR INJECTION IJ SOLN
INTRAMUSCULAR | Status: AC
  Filled 2015-01-26: qty 10

## 2015-01-26 MED ORDER — HYDROMORPHONE HCL 1 MG/ML IJ SOLN
0.2500 mg | INTRAMUSCULAR | Status: DC | PRN
  Administered 2015-01-26 (×3): 0.5 mg via INTRAVENOUS

## 2015-01-26 MED ORDER — ONDANSETRON HCL 4 MG/2ML IJ SOLN
INTRAMUSCULAR | Status: DC | PRN
  Administered 2015-01-26: 4 mg via INTRAVENOUS

## 2015-01-26 MED ORDER — HEMOSTATIC AGENTS (NO CHARGE) OPTIME
TOPICAL | Status: DC | PRN
  Administered 2015-01-26: 1 via TOPICAL

## 2015-01-26 MED ORDER — HYDROMORPHONE HCL 1 MG/ML IJ SOLN
INTRAMUSCULAR | Status: AC
  Filled 2015-01-26: qty 1

## 2015-01-26 MED ORDER — ROCURONIUM BROMIDE 50 MG/5ML IV SOLN
INTRAVENOUS | Status: AC
  Filled 2015-01-26: qty 1

## 2015-01-26 MED ORDER — VECURONIUM BROMIDE 10 MG IV SOLR
INTRAVENOUS | Status: DC | PRN
  Administered 2015-01-26: 1 mg via INTRAVENOUS
  Administered 2015-01-26: 2 mg via INTRAVENOUS
  Administered 2015-01-26: 1 mg via INTRAVENOUS

## 2015-01-26 MED ORDER — FENTANYL CITRATE 0.05 MG/ML IJ SOLN
INTRAMUSCULAR | Status: AC
  Filled 2015-01-26: qty 5

## 2015-01-26 SURGICAL SUPPLY — 88 items
APPLICATOR TIP COSEAL (VASCULAR PRODUCTS) ×3 IMPLANT
APPLICATOR TIP EXT COSEAL (VASCULAR PRODUCTS) IMPLANT
BLADE SURG 11 STRL SS (BLADE) IMPLANT
BRUSH CYTOL CELLEBRITY 1.5X140 (MISCELLANEOUS) IMPLANT
CANISTER SUCTION 2500CC (MISCELLANEOUS) ×3 IMPLANT
CATH KIT ON Q 5IN SLV (PAIN MANAGEMENT) IMPLANT
CATH THORACIC 28FR (CATHETERS) IMPLANT
CATH THORACIC 36FR (CATHETERS) IMPLANT
CATH THORACIC 36FR RT ANG (CATHETERS) IMPLANT
CLIP TI MEDIUM 6 (CLIP) IMPLANT
CONN ST 1/4X3/8  BEN (MISCELLANEOUS) ×1
CONN ST 1/4X3/8 BEN (MISCELLANEOUS) ×2 IMPLANT
CONT SPEC 4OZ CLIKSEAL STRL BL (MISCELLANEOUS) ×12 IMPLANT
COVER TABLE BACK 60X90 (DRAPES) ×3 IMPLANT
DERMABOND ADVANCED (GAUZE/BANDAGES/DRESSINGS)
DERMABOND ADVANCED .7 DNX12 (GAUZE/BANDAGES/DRESSINGS) IMPLANT
DRAIN CHANNEL 28F RND 3/8 FF (WOUND CARE) IMPLANT
DRAIN CHANNEL 32F RND 10.7 FF (WOUND CARE) IMPLANT
DRAPE LAPAROSCOPIC ABDOMINAL (DRAPES) ×3 IMPLANT
DRAPE SLUSH/WARMER DISC (DRAPES) ×3 IMPLANT
DRAPE WARM FLUID 44X44 (DRAPE) IMPLANT
DRILL BIT 7/64X5 (BIT) ×3 IMPLANT
ELECT BLADE 4.0 EZ CLEAN MEGAD (MISCELLANEOUS) ×6
ELECT REM PT RETURN 9FT ADLT (ELECTROSURGICAL) ×3
ELECTRODE BLDE 4.0 EZ CLN MEGD (MISCELLANEOUS) ×4 IMPLANT
ELECTRODE REM PT RTRN 9FT ADLT (ELECTROSURGICAL) ×2 IMPLANT
FORCEPS BIOP RJ4 1.8 (CUTTING FORCEPS) IMPLANT
GAUZE SPONGE 4X4 12PLY STRL (GAUZE/BANDAGES/DRESSINGS) ×6 IMPLANT
GLOVE BIO SURGEON STRL SZ 6.5 (GLOVE) ×6 IMPLANT
GLOVE BIOGEL PI IND STRL 6.5 (GLOVE) ×2 IMPLANT
GLOVE BIOGEL PI INDICATOR 6.5 (GLOVE) ×1
GOWN STRL REUS W/ TWL LRG LVL3 (GOWN DISPOSABLE) ×8 IMPLANT
GOWN STRL REUS W/TWL LRG LVL3 (GOWN DISPOSABLE) ×4
HANDLE STAPLE ENDO GIA SHORT (STAPLE) ×1
KIT BASIN OR (CUSTOM PROCEDURE TRAY) ×3 IMPLANT
KIT CLEAN ENDO COMPLIANCE (KITS) ×3 IMPLANT
KIT ROOM TURNOVER OR (KITS) ×3 IMPLANT
KIT SUCTION CATH 14FR (SUCTIONS) IMPLANT
MARKER SKIN DUAL TIP RULER LAB (MISCELLANEOUS) IMPLANT
NEEDLE BIOPSY TRANSBRONCH 21G (NEEDLE) IMPLANT
NS IRRIG 1000ML POUR BTL (IV SOLUTION) ×12 IMPLANT
OIL SILICONE PENTAX (PARTS (SERVICE/REPAIRS)) ×3 IMPLANT
PACK CHEST (CUSTOM PROCEDURE TRAY) ×3 IMPLANT
PAD ARMBOARD 7.5X6 YLW CONV (MISCELLANEOUS) ×9 IMPLANT
PASSER SUT SWANSON 36MM LOOP (INSTRUMENTS) ×3 IMPLANT
RELOAD EGIA 45 MED/THCK PURPLE (STAPLE) ×9 IMPLANT
RELOAD EGIA 60 MED/THCK PURPLE (STAPLE) ×9 IMPLANT
SCISSORS LAP 5X35 DISP (ENDOMECHANICALS) IMPLANT
SEALANT PROGEL (MISCELLANEOUS) IMPLANT
SEALANT SURG COSEAL 4ML (VASCULAR PRODUCTS) IMPLANT
SEALANT SURG COSEAL 8ML (VASCULAR PRODUCTS) ×3 IMPLANT
SOLUTION ANTI FOG 6CC (MISCELLANEOUS) ×3 IMPLANT
SPONGE GAUZE 4X4 12PLY STER LF (GAUZE/BANDAGES/DRESSINGS) ×3 IMPLANT
STAPLER ENDO GIA 12MM SHORT (STAPLE) ×2 IMPLANT
SUT PROLENE 3 0 SH DA (SUTURE) IMPLANT
SUT PROLENE 4 0 RB 1 (SUTURE)
SUT PROLENE 4-0 RB1 .5 CRCL 36 (SUTURE) IMPLANT
SUT SILK  1 MH (SUTURE) ×4
SUT SILK 1 MH (SUTURE) ×8 IMPLANT
SUT SILK 1 TIES 10X30 (SUTURE) ×3 IMPLANT
SUT SILK 2 0 SH (SUTURE) IMPLANT
SUT SILK 2 0SH CR/8 30 (SUTURE) IMPLANT
SUT SILK 3 0SH CR/8 30 (SUTURE) IMPLANT
SUT STEEL 1 (SUTURE) IMPLANT
SUT VIC AB 1 CTX 18 (SUTURE) ×3 IMPLANT
SUT VIC AB 1 CTX 36 (SUTURE)
SUT VIC AB 1 CTX36XBRD ANBCTR (SUTURE) IMPLANT
SUT VIC AB 2-0 CTX 36 (SUTURE) ×3 IMPLANT
SUT VIC AB 2-0 UR6 27 (SUTURE) IMPLANT
SUT VIC AB 3-0 SH 8-18 (SUTURE) IMPLANT
SUT VIC AB 3-0 X1 27 (SUTURE) ×3 IMPLANT
SUT VICRYL 0 UR6 27IN ABS (SUTURE) IMPLANT
SUT VICRYL 2 TP 1 (SUTURE) ×3 IMPLANT
SWAB COLLECTION DEVICE MRSA (MISCELLANEOUS) IMPLANT
SYR 20ML ECCENTRIC (SYRINGE) ×3 IMPLANT
SYSTEM SAHARA CHEST DRAIN ATS (WOUND CARE) ×3 IMPLANT
TAPE CLOTH SURG 4X10 WHT LF (GAUZE/BANDAGES/DRESSINGS) ×3 IMPLANT
TAPE UMBILICAL COTTON 1/8X30 (MISCELLANEOUS) ×3 IMPLANT
TIP APPLICATOR SPRAY EXTEND 16 (VASCULAR PRODUCTS) IMPLANT
TOWEL OR 17X24 6PK STRL BLUE (TOWEL DISPOSABLE) ×6 IMPLANT
TOWEL OR 17X26 10 PK STRL BLUE (TOWEL DISPOSABLE) ×6 IMPLANT
TRAP SPECIMEN MUCOUS 40CC (MISCELLANEOUS) ×3 IMPLANT
TRAY FOLEY CATH 16FRSI W/METER (SET/KITS/TRAYS/PACK) ×3 IMPLANT
TROCAR XCEL BLUNT TIP 100MML (ENDOMECHANICALS) IMPLANT
TUBE ANAEROBIC SPECIMEN COL (MISCELLANEOUS) IMPLANT
TUBE CONNECTING 20X1/4 (TUBING) ×6 IMPLANT
TUNNELER SHEATH ON-Q 11GX8 DSP (PAIN MANAGEMENT) IMPLANT
WATER STERILE IRR 1000ML POUR (IV SOLUTION) ×6 IMPLANT

## 2015-01-26 NOTE — Transfer of Care (Signed)
Immediate Anesthesia Transfer of Care Note  Patient: Dakota Shannon  Procedure(s) Performed: Procedure(s): VIDEO BRONCHOSCOPY (N/A) RIGHT VIDEO ASSISTED THORACOSCOPY (VATS)WEDGE RESECTION OF RIGHT UPPER LOBE, LYMPH NODE DISSECTION (Right)  Patient Location: PACU  Anesthesia Type:General  Level of Consciousness: awake, alert  and oriented  Airway & Oxygen Therapy: Patient Spontanous Breathing and Patient connected to face mask oxygen  Post-op Assessment: Report given to RN and Post -op Vital signs reviewed and stable  Post vital signs: Reviewed and stable  Last Vitals:  Filed Vitals:   01/26/15 0554  BP: 177/96  Pulse: 53  Temp: 36.4 C  Resp: 20    Complications: No apparent anesthesia complications

## 2015-01-26 NOTE — H&P (Signed)
[address]       [place] [address]             [phone]                    [place] Medical Record #[id number] Date of Birth: 09-19-1935  Referring: Milus Height, MD Primary Care: [name]., MD  Chief Complaint:    Right lung mass   History of Present Illness:    Dakota Shannon 80 y.o. male is seen in the office   with a right submandibular lymphadenopathy and a right upper lobe lung mass concerning follow-up primary bronchogenic carcinoma. The  patient underwent a right submandibular lymph node biopsy    suggest low-grade lymphoma versus inflammatory process. Patient denied any B symptoms, stable weight, some depression of a mild, no GU symptoms, but mild constipation intermittently. He denies any pains. He denies any neurological problems.  The patient notes the right cervical adenopathy is been present since 2009. When he saw the dentist in January the oral exam revealed the neck nodes and the patient was encouraged to have further follow-up. This led to a PET scan CT scan of the chest biopsy of lymph node. No evidence of head and neck cancer was noted. The patient is referred for treatment of right upper lobe 2.5 cm lung lesion suspicious for malignancy.    Patient's previous cardiac history: He denies any current anginal type chest pain, denies any previous myocardial infarction, has a history of tachycardia 30 years ago, and his chart this was noted to [facility].   04/06/2010 [facility] Internal Medicine report:  Exercise Cardiolite with no diagnostic ST segment changes to indicate ischemia, 86% of maximum age-predicted heart rate, 4 METS, hypertensive response with blood pressure 186/88. LVEF 76% with normal wall motion. Moderate perfusion defect at the apex, essentially fixed, although described as slightly more prominent on stress imaging. Study interpreted by Dr. Rayford Halsted to show no significant ischemia, however possible small  apical scar versus apical thinning.  04/27/2010 Normal left ventricular chamber size and contraction with LVEF 55-60%, no major valvular abnormalities   WOLFF (WOLFE)-PARKINSON-WHITE (WPW) SYNDROME (ICD-426.7)  Based on history. No reported palpitations.  His updated medication list for this problem includes:  Propranolol Hcl 80 Mg Tabs (Propranolol hcl) .Marland Kitchen... Take 1 tablet by mouth two times a day  Lisinopril 20 Mg Tabs (Lisinopril) .Marland Kitchen... Take 1 tablet by mouth once a day  Ecotrin Low Strength 81 Mg Tbec (Aspirin) .Marland Kitchen... Take 1 tablet by mouth once a day  Isosorbide Mononitrate Cr 30 Mg Xr24h-tab (Isosorbide mononitrate) .Marland Kitchen... Take one tablet by mouth daily Current Activity/ Functional Status:  Patient is independent with mobility/ambulation, transfers, ADL's, IADL's.   Zubrod Score: At the time of surgery this patient's most appropriate activity status/level should be described as: []     0    Normal activity, no symptoms [x]     1    Restricted in physical strenuous activity but ambulatory, able to do out light work []     2    Ambulatory and capable of self care, unable to do work activities, up and about               >50 % of waking hours                              []     3  Only limited self care, in bed greater than 50% of waking hours []     4    Completely disabled, no self care, confined to bed or chair []     5    Moribund   Past Medical History  Diagnosis Date  . Atrial fibrillation     Possible history based on limited information  . Mixed hyperlipidemia   . Essential hypertension, benign   . Wolff-Parkinson-White (WPW) syndrome     Possible history based on limited information  . Arthritis   . Diverticulosis   . Heart murmur   . Complication of anesthesia     UNABLE TO URINATE   . Dysrhythmia     TACHYCARDIA  . Shortness of breath dyspnea     WITH EXERTION    . Swallowing difficulty   . Cancer   . Hodgkin's lymphoma     EARLY STAGES   GALL  STONES ON CT SCAN  Past surgical history: Only for removal of skin lesions  Family history: Patient's mother died at age 61 of diabetes, father died at age 61 with lung cancer was a long-term smoker, one son died of accidental drowning, one daughter is alive and with the patient today  History   Social History  . Marital Status: Married    Spouse Name: N/A  . Number of Children: N/A  . Years of Education: N/A   Occupational History  . Retired previously worked in Nurse, adult at [employer] denies any asbestos exposure     Social History Main Topics  . Smoking status: Former Smoker -- 0.50 packs/day for 40 years    Types: Cigarettes    Quit date: 11/07/1979  . Smokeless tobacco: Never Used  . Alcohol Use: No  . Drug Use: No  . Sexual Activity: Not on file      No Known Allergies  Current Facility-Administered Medications  Medication Dose Route Frequency Provider Last Rate Last Dose  . cefUROXime (ZINACEF) 1.5 g in dextrose 5 % 50 mL IVPB  1.5 g Intravenous 60 min Pre-Op Grace Isaac, MD       Facility-Administered Medications Ordered in Other Encounters  Medication Dose Route Frequency Provider Last Rate Last Dose  . fentaNYL (SUBLIMAZE) injection    Anesthesia Intra-op Chryl Heck O'Laughlin, CRNA   50 mcg at 01/26/15 [phone]  . lactated ringers infusion    Continuous PRN Chryl Heck O'Laughlin, CRNA      . lactated ringers infusion    Continuous PRN Chryl Heck O'Laughlin, CRNA      . midazolam (VERSED) 5 MG/5ML injection    Anesthesia Intra-op Susa Loffler, CRNA   1 mg at 01/26/15 5681      Review of Systems:     Cardiac Review of Systems: Y or N  Chest Pain [  N  ]  Resting SOB Aqua.Slicker   ] Exertional SOB  [Y  ]  Orthopnea [ N ]   Pedal Edema [ N  ]    Palpitations [Y  ] Syncope  [ N ]   Presyncope [  N ]  General Review of Systems: [Y] = yes [  ]=no Constitional: recent weight change [  ];  Wt loss over the last 3 months [   ] anorexia [  ]; fatigue [  ];  nausea [  ]; night sweats [  ]; fever [  ]; or chills [  ];          Dental: poor  dentition[  ]; Last Dentist visit:   Eye : blurred vision [  ]; diplopia [   ]; vision changes [  ];  Amaurosis fugax[  ]; Resp: cough [  ];  wheezing[  ];  hemoptysis[  ]; shortness of breath[  ]; paroxysmal nocturnal dyspnea[  ]; dyspnea on exertion[  ]; or orthopnea[  ];  GI:  gallstones[  ], vomiting[  ];  dysphagia[ WHEN EATS TOO FAST ]; melena[  ];  hematochezia [  ]; heartburn[  ];   Hx of  Colonoscopy[  Y]; GU: kidney stones [  ]; hematuria[  ];   dysuria [  ];  nocturia[  ];  history of     obstruction [  ]; urinary frequency [  ]             Skin: rash, swelling[  ];, hair loss[  ];  peripheral edema[  ];  or itching[  ]; Musculosketetal: myalgias[  ];  joint swelling[  ];  joint erythema[  ];  joint pain[  ];  back pain[  ];  Heme/Lymph: bruising[  ];  bleeding[  ];  anemia[  ];  Neuro: TIA[  ];  headaches[  ];  stroke[  ];  vertigo[  ];  seizures[  ];   paresthesias[ N ];  difficulty walking[N  ];  Psych:depression[ N ]; anxiety[  N];  Endocrine: diabetes[  ];  thyroid dysfunction[  ];  Immunizations: Flu up to date [ ? ]; Pneumococcal up to date [?  ];  Other:  Physical Exam: BP 177/96 mmHg  Pulse 53  Temp(Src) 97.5 F (36.4 C) (Oral)  Resp 20  SpO2 100% BMI 37   PHYSICAL EXAMINATION: General appearance: alert, cooperative and appears older than stated age Head: Normocephalic, without obvious abnormality, atraumatic Neck: moderate anterior cervical adenopathy, no carotid bruit, no JVD, supple, symmetrical, trachea midline and thyroid not enlarged, symmetric, no tenderness/mass/nodules Lymph nodes: Cervical, supraclavicular, and axillary nodes normal. Resp: clear to auscultation bilaterally Back: symmetric, no curvature. ROM normal. No CVA tenderness. Cardio: regular rate and rhythm, S1, S2 normal, no murmur, click, rub or gallop GI: soft, non-tender; bowel sounds normal; no masses,  no  organomegaly Extremities: extremities normal, atraumatic, no cyanosis or edema and Homans sign is negative, no sign of DVT Neurologic: Grossly normal PATIENT HAS NO CAROTID BRUITS, PULSES ARE PALPABLE BILATERALLY, DP AND PT PULSES ARE BILATERALLY PRESENT  Diagnostic Studies & Laboratory data:     Recent Radiology Findings:   Nm Pet Image Initial (pi) Skull Base To Thigh  12/30/2014   CLINICAL DATA:  Initial treatment strategy for lung mass.  EXAM: NUCLEAR MEDICINE PET SKULL BASE TO THIGH  TECHNIQUE: 14.1 mCi F-18 FDG was injected intravenously. Full-ring PET imaging was performed from the skull base to thigh after the radiotracer. CT data was obtained and used for attenuation correction and anatomic localization.  FASTING BLOOD GLUCOSE:  Value: Eighty-six mg/dl  COMPARISON:  CT chest abdomen pelvis 12/18/2014 and CT neck 12/11/2014.  FINDINGS: NECK  Right level 1 and 2 lymph nodes are hypermetabolic. Index right level 1 lymph node measures 2.0 cm in short axis (CT image 44) and has an SUV max of 8.3. CT images show no acute findings.  CHEST  Spiculated nodule in the apical segment right upper lobe measures 1.7 x 2.5 cm with an SUV max of 5.0. No hypermetabolic mediastinal, hilar or axillary adenopathy. CT images show no acute findings. Right lobe of the thyroid is asymmetrically  enlarged and partially calcified. Esophagus is somewhat dilated and contains fluid, suggesting dysmotility. Three-vessel coronary artery calcification. No pericardial or pleural effusion.  ABDOMEN/PELVIS  No abnormal hypermetabolism in the liver or right adrenal gland. Minimal activity is seen within a 1.8 x 2.5 cm lateral limb left adrenal nodule, which measures 10 Hounsfield units. No abnormal hypermetabolism within the spleen or pancreas. No hypermetabolic lymph nodes. CT images show stones in the gallbladder. Low and high attenuation lesions in the kidneys are again seen, measuring up to 8.0 cm on the left, poorly characterized  without post-contrast imaging. Specifically, there is a 2.4 cm hyperdense lesion in the lower pole right kidney. Spleen, pancreas, stomach and bowel are grossly unremarkable. Calcifications are seen in the prostate. No free fluid.  SKELETON  No abnormal osseous hypermetabolism.  IMPRESSION: 1. Hypermetabolic spiculated right upper lobe mass is most indicative of primary bronchogenic carcinoma. 2. Hypermetabolic adenopathy in the right neck is likely due to metastatic disease as a primary head/neck malignancy is not identified. 3. Hyperdense lesion in the lower pole right kidney. Renal cell carcinoma cannot be excluded. Further evaluation with pre and post contrast MRI or CT should be considered. Additional low-attenuation lesions in the kidneys can be further evaluated at that time as well. 4. Three-vessel coronary artery calcification. 5. Left adrenal adenoma. 6. Cholelithiasis.   Electronically Signed   By: Lorin Picket M.D.   On: 12/30/2014 16:00  ADDENDUM REPORT: 01/06/2015 09:07  ADDENDUM: It should be noted that the solid mass described in the kidneys on image number 77 of series 2 lies within the lower pole of the right kidney and not the left kidney.   Electronically Signed By: Inez Catalina M.D. On: 01/06/2015 09:07  CLINICAL DATA: Pulmonary nodule and neck region adenopathy  EXAM: CT CHEST, ABDOMEN, AND PELVIS WITH CONTRAST  TECHNIQUE: Multidetector CT imaging of the chest, abdomen and pelvis was performed following the standard protocol during bolus administration of intravenous contrast.  CONTRAST: 100 mL Isovue 370 nonionic  COMPARISON: CT neck December 11, 2014  FINDINGS: CT CHEST FINDINGS  There is a spiculated nodular lesion in the right apex measuring 2.6 x 2.0 cm. There is a 4 mm nodular lesion in the anterior segment of the left upper lobe on slice 13 series 5 slightly inferior to the larger lesion. No other parenchymal lung nodular lesions  are identified. There is mild scarring in the inferior most aspect of the lingula. There are several subcentimeter mediastinal lymph nodes. To the right of the trachea at the level of the aortic arch, there is a focal lymph node measuring 1.5 x 1.1 cm. No other lymph node prominence is seen.  There is thickening of the right side of the wall of the esophagus from the inferior trachea to a level slightly below the carina. There is moderate dilatation of the esophagus along the more leftward aspect with some wall thickening more diffusely through the distal half of the esophagus.  There are multiple foci of coronary artery calcification.  Thyroid is enlarged with multiple calcifications, primarily on the right. There are no blastic or lytic bone lesions. There is degenerative change in thoracic spine.  CT ABDOMEN AND PELVIS FINDINGS  Liver is prominent measuring 20.9 cm in length. There is hepatic steatosis. No liver masses are identified. Gallbladder is contracted with cholelithiasis. There is no appreciable gallbladder wall thickening. There is no biliary duct dilatation.  Spleen is prominent measuring 16.2 x 14.4 x 8.3 cm. No focal splenic lesions are  identified.  Pancreas appears normal. Right adrenal is normal. There is a mass arising from the left adrenal measuring 2.8 x 1.7 cm.  There is a solid mass arising from the anterior lower pole of the left kidney measuring 2.4 x 2.6 cm, best seen on axial slice 77 series 2. There are simple cysts in the right kidney is well, largest measuring 3.2 by 2.0 cm. On the left, there is a dominant cyst laterally measuring 8.2 by 7.8 cm. There is also a cyst more superiorly and posteriorly measuring 3.1 x 2.5 cm. There is no renal hydronephrosis on either side. There is no renal or ureteral calculus on either side.  In the pelvis, urinary bladder is midline with normal wall thickness. The prostate is enlarged with multiple  prostatic calculi. There is no pelvic mass or fluid collection. There are multiple sigmoid diverticula without diverticulitis. Appendix appears normal.  There is no bowel obstruction. No free air or portal venous air. There is no ascites, adenopathy, or abscess in the abdomen or pelvis. There is atherosclerotic change in aorta the no aneurysm. There is degenerative change in the lumbar spine. There are no blastic or lytic bone lesions.  IMPRESSION: CT chest: Spiculated mass in the right apex measuring 2.6 x 2.0 cm, suspicious for lung carcinoma. 4 mm nearby lesion could represent a small metastasis.  Single borderline prominent lymph node in the right paratracheal region.  Abnormal appearance the esophagus with the eccentric wall thickening along the rightward aspect of the midesophagus with dilatation with a air on the leftward aspect and more generalized thickening in the distal half of the esophagus. This finding may well warrant direct visualization to assess for possible esophageal neoplastic lesion.  Enlarged thyroid with multiple nodular lesions as well as extensive calcification, particular on the right. Suspect multinodular goiter, although an underlying thyroid neoplasm cannot be excluded on the right.  Extensive atherosclerotic calcification.  CT abdomen and pelvis: There is a left adrenal mass concerning for metastatic focus. There is a solid left renal mass concerning for renal cell carcinoma. It would be unusual for the other lesions to be associated with renal cell carcinoma as opposed to lung carcinoma, although both entities certainly may exist concurrently. Further evaluation with pre and post contrast MRI or CT should be considered. MRI is preferred in younger patients (due to lack of ionizing radiation) and for evaluating calcified lesion(s).  Enlarged prostate with multiple prostatic calculi.  Multiple sigmoid diverticula.  No adenopathy is  seen in the abdomen or pelvis. No focal liver lesions are identified, although the liver and spleen are prominent, and there is hepatic steatosis.  Cholelithiasis.  Electronically Signed: By: Lowella Grip III M.D. On: 12/18/2014 15:03   I have independently reviewed the above radiologic studies.  Recent Lab Findings: Lab Results  Component Value Date   WBC 4.5 01/22/2015   HGB 13.4 01/22/2015   HCT 40.8 01/22/2015   PLT 183 01/22/2015   GLUCOSE 95 01/22/2015   ALT 17 01/22/2015   AST 22 01/22/2015   NA 140 01/22/2015   K 4.4 01/22/2015   CL 111 01/22/2015   CREATININE 1.15 01/22/2015   BUN 20 01/22/2015   CO2 18* 01/22/2015   INR 1.05 01/22/2015   PFT's  Pulmonary Function Diagnosis: Minimal Obstructive Airways Disease -Peripheral Airway Insigfnificant reponse to bronchodilator Normal Diffusion Lung volume not recorded  Cleared by cardiology  Assessment / Plan:   1. Hypermetabolic spiculated right upper lobe mass is most indicative of primary bronchogenic  carcinoma. The PET scan and CAT scan are highly suspicious for clinical stage I carcinoma of the lung. I recommend to the patient that we proceed with bronchoscopy right video-assisted thoracoscopy and lung resection to obtain a tissue diagnosis and treatment. He would like to proceed with this will tentatively arrange for Tuesday, March 22.  Risks and options of the procedure were discussed with the patient and his daughter in detail and her questions were answered.   2. 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 Hyperdense lesion in the lower pole right kidney. Renal cell carcinoma possible- will need further workup  4. Three-vessel coronary artery calcification ON CT SCAN  No ischemic symptoms 5. Left adrenal adenoma.  6. Cholelithiasis.-Asymptomatic  7 Obesity- BMI 37 8 WOLFF (WOLFE)-PARKINSON-WHITE (WPW) SYNDROME (ICD-426.7)   The goals risks and alternatives of the planned  surgical procedurebronchoscopy right video-assisted thoracoscopy and lung resection to obtain a tissue diagnosis and treatment  have been discussed with the patient in detail. The risks of the procedure including death, infection, stroke, myocardial infarction, bleeding, blood transfusion have all been discussed specifically.  I have quoted Dakota Shannon a 3 % of perioperative mortality and a complication rate as high as 25 %. The patient's questions have been answered.Dakota Shannon is willing  to proceed with the planned procedure.   Grace Isaac MD      [address] [place] [address] Office [phone]   Beeper [phone]  01/26/2015 7:18 AM

## 2015-01-26 NOTE — Anesthesia Procedure Notes (Addendum)
Procedure Name: Intubation Date/Time: 01/26/2015 7:44 AM Performed by: Susa Loffler Pre-anesthesia Checklist: Patient identified, Emergency Drugs available, Suction available, Patient being monitored and Timeout performed Patient Re-evaluated:Patient Re-evaluated prior to inductionOxygen Delivery Method: Circle system utilized Preoxygenation: Pre-oxygenation with 100% oxygen Intubation Type: IV induction Ventilation: Mask ventilation without difficulty and Oral airway inserted - appropriate to patient size Laryngoscope Size: Mac and 3 Grade View: Grade I Tube type: Oral Tube size: 8.5 mm Number of attempts: 1 Airway Equipment and Method: Stylet Placement Confirmation: ETT inserted through vocal cords under direct vision,  positive ETCO2,  CO2 detector and breath sounds checked- equal and bilateral Secured at: 23 cm Tube secured with: Tape Dental Injury: Teeth and Oropharynx as per pre-operative assessment  Comments: Intubation per SRNA   Procedure Name: Intubation Date/Time: 01/26/2015 7:53 AM Performed by: Susa Loffler Pre-anesthesia Checklist: Patient identified, Emergency Drugs available, Suction available, Patient being monitored and Timeout performed Patient Re-evaluated:Patient Re-evaluated prior to inductionOxygen Delivery Method: Circle system utilized Preoxygenation: Pre-oxygenation with 100% oxygen Laryngoscope Size: Mac and 3 Grade View: Grade I Endobronchial tube: Double lumen EBT and 39 Fr Number of attempts: 1 Airway Equipment and Method: Fiberoptic brochoscope (Cook exchange catheter used to replace 8.5 ETT with size 39 DLT; DLT placement confirmed with fiberoptic bronchoscope) Placement Confirmation: ETT inserted through vocal cords under direct vision,  positive ETCO2,  CO2 detector and breath sounds checked- equal and bilateral Tube secured with: Tape Dental Injury: Teeth and Oropharynx as per pre-operative assessment       CVP

## 2015-01-26 NOTE — Brief Op Note (Addendum)
      [address]       [place] [address]             [phone]    01/26/2015  9:54 AM  PATIENT:  Maia Breslow  80 y.o. male  PRE-OPERATIVE DIAGNOSIS:  RIGHT UPPER LOBE LUNG MASS  POST-OPERATIVE DIAGNOSIS:  RIGHT UPPER LOBE LUNG MASS  PROCEDURE:   VIDEO BRONCHOSCOPY RIGHT VIDEO ASSISTED THORACOSCOPY/MINI-THORACOTOMY  WEDGE RESECTION OF RIGHT UPPER LOBE  LYMPH NODE DISSECTION  SURGEON:  Surgeon(s): Grace Isaac, MD  ASSISTANT: Suzzanne Cloud, PA-C  ANESTHESIA:   general  SPECIMEN:  Source of Specimen:  right upper lobe wedge, lymph nodes closet margin 1.6 cm negative on FS   DISPOSITION OF SPECIMEN:  Pathology  DRAINS: 28 Fr CT, 28 Blake drain  PATIENT CONDITION:  PACU - hemodynamically stable.

## 2015-01-26 NOTE — Care Management Note (Unsigned)
    Page 1 of 1   01/29/2015     1:11:19 PM CARE MANAGEMENT NOTE 01/29/2015  Patient:  Dakota Shannon, Dakota Shannon   Account Number:  [id number]  Date Initiated:  01/26/2015  Documentation initiated by:  COLE,ANGELA  Subjective/Objective Assessment:   PTA from home admitted with (r) lung mass, s/p vats procedure.     Action/Plan:   Return to home when medically stable. CM to f/u with d/c needs.   Anticipated DC Date:     Anticipated DC Plan:        DC Planning Services  CM consult      Choice offered to / List presented to:             Status of service:  In process, will continue to follow Medicare Important Message given?  YES (If response is "NO", the following Medicare IM given date fields will be blank) Date Medicare IM given:  01/29/2015 Medicare IM given by:  Whitman Hero Date Additional Medicare IM given:   Additional Medicare IM given by:    Discharge Disposition:    Per UR Regulation:  Reviewed for med. necessity/level of care/duration of stay  If discussed at [place] of Stay Meetings, dates discussed:    Comments:

## 2015-01-26 NOTE — Anesthesia Postprocedure Evaluation (Signed)
Anesthesia Post Note  Patient: Dakota Shannon  Procedure(s) Performed: Procedure(s) (LRB): VIDEO BRONCHOSCOPY (N/A) RIGHT VIDEO ASSISTED THORACOSCOPY (VATS)WEDGE RESECTION OF RIGHT UPPER LOBE, LYMPH NODE DISSECTION (Right)  Anesthesia type: General  Patient location: PACU  Post pain: Pain level controlled and Adequate analgesia  Post assessment: Post-op Vital signs reviewed, Patient's Cardiovascular Status Stable, Respiratory Function Stable, Patent Airway and Pain level controlled  Last Vitals:  Filed Vitals:   01/26/15 1130  BP:   Pulse: 53  Temp:   Resp: 21    Post vital signs: Reviewed and stable  Level of consciousness: awake, alert  and oriented  Complications: No apparent anesthesia complications

## 2015-01-26 NOTE — Progress Notes (Signed)
Pt arrived to unit accompanied by PACU RN. Pt oriented to room/unit. Daughter at bedside. VS stable. No s/s of acute distress noted.

## 2015-01-26 NOTE — Anesthesia Preprocedure Evaluation (Addendum)
Anesthesia Evaluation  Patient identified by MRN, date of birth, ID band Patient awake    Reviewed: Allergy & Precautions, NPO status , Patient's Chart, lab work & pertinent test results  Airway Mallampati: II  TM Distance: >3 FB Neck ROM: full    Dental  (+) Teeth Intact, Dental Advisory Given   Pulmonary shortness of breath, former smoker,          Cardiovascular hypertension, + dysrhythmias Atrial Fibrillation     Neuro/Psych    GI/Hepatic   Endo/Other  Morbid obesity  Renal/GU      Musculoskeletal  (+) Arthritis -,   Abdominal   Peds  Hematology   Anesthesia Other Findings   Reproductive/Obstetrics                            Anesthesia Physical Anesthesia Plan  ASA: III  Anesthesia Plan: General   Post-op Pain Management:    Induction: Intravenous  Airway Management Planned: Double Lumen EBT  Additional Equipment: Arterial line and CVP  Intra-op Plan:   Post-operative Plan: Extubation in OR  Informed Consent: I have reviewed the patients History and Physical, chart, labs and discussed the procedure including the risks, benefits and alternatives for the proposed anesthesia with the patient or authorized representative who has indicated his/her understanding and acceptance.   Dental advisory given  Plan Discussed with: CRNA, Anesthesiologist and Surgeon  Anesthesia Plan Comments:        Anesthesia Quick Evaluation

## 2015-01-27 ENCOUNTER — Inpatient Hospital Stay (HOSPITAL_COMMUNITY): Payer: Medicare Other

## 2015-01-27 LAB — CBC
HCT: 39.6 % (ref 39.0–52.0)
Hemoglobin: 12.6 g/dL — ABNORMAL LOW (ref 13.0–17.0)
MCH: 28.6 pg (ref 26.0–34.0)
MCHC: 31.8 g/dL (ref 30.0–36.0)
MCV: 90 fL (ref 78.0–100.0)
Platelets: 181 10*3/uL (ref 150–400)
RBC: 4.4 MIL/uL (ref 4.22–5.81)
RDW: 14.2 % (ref 11.5–15.5)
WBC: 6.5 10*3/uL (ref 4.0–10.5)

## 2015-01-27 LAB — BASIC METABOLIC PANEL
Anion gap: 4 — ABNORMAL LOW (ref 5–15)
BUN: 14 mg/dL (ref 6–23)
CALCIUM: 8.4 mg/dL (ref 8.4–10.5)
CO2: 27 mmol/L (ref 19–32)
Chloride: 105 mmol/L (ref 96–112)
Creatinine, Ser: 1.13 mg/dL (ref 0.50–1.35)
GFR calc Af Amer: 70 mL/min — ABNORMAL LOW (ref >90)
GFR calc non Af Amer: 61 mL/min — ABNORMAL LOW (ref >90)
GLUCOSE: 120 mg/dL — AB (ref 70–99)
POTASSIUM: 4.1 mmol/L (ref 3.5–5.1)
Sodium: 136 mmol/L (ref 135–145)

## 2015-01-27 LAB — BLOOD GAS, ARTERIAL
Acid-base deficit: 1.2 mmol/L (ref 0.0–2.0)
Bicarbonate: 23.2 mEq/L (ref 20.0–24.0)
Drawn by: 41977
O2 Content: 2 L/min
O2 SAT: 96.4 %
PATIENT TEMPERATURE: 98.6
PH ART: 7.385 (ref 7.350–7.450)
TCO2: 24.4 mmol/L (ref 0–100)
pCO2 arterial: 39.6 mmHg (ref 35.0–45.0)
pO2, Arterial: 83.5 mmHg (ref 80.0–100.0)

## 2015-01-27 MED ORDER — LISINOPRIL 10 MG PO TABS
10.0000 mg | ORAL_TABLET | Freq: Two times a day (BID) | ORAL | Status: DC
  Administered 2015-01-28 – 2015-01-31 (×7): 10 mg via ORAL
  Filled 2015-01-27 (×11): qty 1

## 2015-01-27 MED ORDER — ENOXAPARIN SODIUM 40 MG/0.4ML ~~LOC~~ SOLN
40.0000 mg | SUBCUTANEOUS | Status: DC
  Administered 2015-01-27 – 2015-01-31 (×5): 40 mg via SUBCUTANEOUS
  Filled 2015-01-27 (×5): qty 0.4

## 2015-01-27 NOTE — Progress Notes (Addendum)
[address]       [place] [address]             [phone]          1 Day Post-Op Procedure(s) (LRB): VIDEO BRONCHOSCOPY (N/A) RIGHT VIDEO ASSISTED THORACOSCOPY (VATS)WEDGE RESECTION OF RIGHT UPPER LOBE, LYMPH NODE DISSECTION (Right)  Subjective: OOB in chair. Feels well except sore with cough.  Tolerating diet, no nausea.  Wants to get Foley out.   Objective: Vital signs in last 24 hours: Patient Vitals for the past 24 hrs:  BP Temp Temp src Pulse Resp SpO2 Height Weight  01/27/15 0515 - - - - 19 95 % - -  01/27/15 0404 (!) 121/59 mmHg - - (!) 58 (!) 23 95 % - -  01/27/15 0358 - 98.5 F (36.9 C) Oral - - - - -  01/26/15 2303 138/63 mmHg 98.7 F (37.1 C) - 62 (!) 25 92 % - -  01/26/15 2300 - - - 68 20 95 % - -  01/26/15 2249 - - - - 17 94 % - -  01/26/15 2242 (!) 148/66 mmHg - - 67 - - - -  01/26/15 2052 - - - - 17 97 % - -  01/26/15 1942 (!) 142/70 mmHg - - 67 18 93 % - -  01/26/15 1939 - 99.2 F (37.3 C) Oral - - - - -  01/26/15 1600 - - - - 17 93 % - -  01/26/15 1548 - - - (!) 57 17 93 % - -  01/26/15 1301 - - - - (!) 21 98 % - -  01/26/15 1300 - - - - - - 5\' 11"  (1.803 m) 264 lb 8.8 oz (120 kg)  01/26/15 1258 140/60 mmHg - - (!) 47 17 94 % - -  01/26/15 1230 - 98.1 F (36.7 C) - - - - - -  01/26/15 1130 - - - (!) 53 (!) 21 94 % - -  01/26/15 1115 - - - (!) 58 (!) 21 98 % - -  01/26/15 1104 - - - - 16 97 % - -  01/26/15 1100 - - - (!) 46 14 100 % - -  01/26/15 1045 140/82 mmHg 97.6 F (36.4 C) - (!) 46 17 100 % - -   Current Weight  01/26/15 264 lb 8.8 oz (120 kg)     Intake/Output from previous day: 03/22 0701 - 03/23 0700 In: 2821.7 [P.O.:120; I.V.:2651.7; IV Piggyback:50] Out: 2831 [Urine:1510; Blood:50; Chest Tube:286]    PHYSICAL EXAM:  Heart: RRR, mildly brady 50-60 Lungs: Clear though slightly diminished in R base Wound:Dressed and dry Chest tube: 1-2 air leak with cough   Lab Results: CBC: Recent Labs  01/27/15 0520  WBC 6.5  HGB 12.6*  HCT 39.6  PLT 181   BMET:  Recent Labs  01/27/15 0520  NA 136  K 4.1  CL 105  CO2 27  GLUCOSE 120*  BUN 14  CREATININE 1.13  CALCIUM 8.4    PT/INR: No results for input(s): LABPROT, INR in the last 72 hours.    Assessment/Plan: S/P Procedure(s) (LRB): VIDEO BRONCHOSCOPY (N/A) RIGHT VIDEO ASSISTED THORACOSCOPY (VATS)WEDGE RESECTION OF RIGHT UPPER LOBE, LYMPH NODE DISSECTION (Right)  CT with small air leak, CXR stable.  Continue CT to suction for now.  CV- BPs trending up, mildly bradycardic.  Watch HR.  Home BP meds resumed.  GU- pt requests to have Foley removed, but  he reports having urinary retention following his lymph node bx and was started on Flomax.  We have restarted Flomax, probably will need to leave Foley 1 more day.  Pulm toilet/IS, mobilize, decrease IVF, d/c A-line.    LOS: 1 day    COLLINS,GINA H 01/27/2015  Patient wants foley out , on Flomax so will try but has history of urinary difficulty Leave chest tubes for now I have seen and examined Dakota Shannon and agree with the above assessment  and plan.  Grace Isaac MD Beeper [phone] Office [phone] 01/27/2015 8:38 AM

## 2015-01-28 ENCOUNTER — Inpatient Hospital Stay (HOSPITAL_COMMUNITY): Payer: Medicare Other

## 2015-01-28 LAB — COMPREHENSIVE METABOLIC PANEL
ALK PHOS: 60 U/L (ref 39–117)
ALT: 15 U/L (ref 0–53)
ANION GAP: 7 (ref 5–15)
AST: 20 U/L (ref 0–37)
Albumin: 3.1 g/dL — ABNORMAL LOW (ref 3.5–5.2)
BILIRUBIN TOTAL: 0.7 mg/dL (ref 0.3–1.2)
BUN: 21 mg/dL (ref 6–23)
CO2: 22 mmol/L (ref 19–32)
Calcium: 8.6 mg/dL (ref 8.4–10.5)
Chloride: 106 mmol/L (ref 96–112)
Creatinine, Ser: 1.23 mg/dL (ref 0.50–1.35)
GFR, EST AFRICAN AMERICAN: 64 mL/min — AB (ref >90)
GFR, EST NON AFRICAN AMERICAN: 55 mL/min — AB (ref >90)
Glucose, Bld: 106 mg/dL — ABNORMAL HIGH (ref 70–99)
POTASSIUM: 4 mmol/L (ref 3.5–5.1)
SODIUM: 135 mmol/L (ref 135–145)
Total Protein: 5.9 g/dL — ABNORMAL LOW (ref 6.0–8.3)

## 2015-01-28 LAB — CBC
HCT: 36.2 % — ABNORMAL LOW (ref 39.0–52.0)
Hemoglobin: 11.8 g/dL — ABNORMAL LOW (ref 13.0–17.0)
MCH: 28.7 pg (ref 26.0–34.0)
MCHC: 32.6 g/dL (ref 30.0–36.0)
MCV: 88.1 fL (ref 78.0–100.0)
PLATELETS: 178 10*3/uL (ref 150–400)
RBC: 4.11 MIL/uL — ABNORMAL LOW (ref 4.22–5.81)
RDW: 14.2 % (ref 11.5–15.5)
WBC: 6.1 10*3/uL (ref 4.0–10.5)

## 2015-01-28 MED ORDER — POLYETHYLENE GLYCOL 3350 17 G PO PACK
17.0000 g | PACK | Freq: Every day | ORAL | Status: DC | PRN
  Administered 2015-01-28: 17 g via ORAL
  Filled 2015-01-28 (×2): qty 1

## 2015-01-28 NOTE — Progress Notes (Addendum)
       [address]       [place] [address]             [phone]          2 Days Post-Op Procedure(s) (LRB): VIDEO BRONCHOSCOPY (N/A) RIGHT VIDEO ASSISTED THORACOSCOPY (VATS)WEDGE RESECTION OF RIGHT UPPER LOBE, LYMPH NODE DISSECTION (Right)  Subjective: Feels well, no complaints.  Coughed up a lot of mucus yesterday.  Walked already th is am. Voiding without difficulty.   Objective: Vital signs in last 24 hours: Patient Vitals for the past 24 hrs:  BP Temp Temp src Pulse Resp SpO2  01/28/15 0736 - 97.9 F (36.6 C) Oral - - -  01/28/15 0546 - - - - 20 95 %  01/28/15 0439 (!) 104/54 mmHg - - 66 20 92 %  01/28/15 0432 - 97.9 F (36.6 C) Oral - - -  01/27/15 2338 - - - - 14 96 %  01/27/15 2335 96/83 mmHg - - 64 17 93 %  01/27/15 2331 - 98 F (36.7 C) Oral - - -  01/27/15 2300 - - - (!) 57 19 96 %  01/27/15 2200 96/83 mmHg - - - - -  01/27/15 2009 - - - - (!) 28 95 %  01/27/15 1919 - 99.5 F (37.5 C) Oral - - -  01/27/15 1910 119/61 mmHg - - 64 (!) 28 93 %  01/27/15 1500 (!) 121/53 mmHg 98.5 F (36.9 C) Oral 62 17 94 %  01/27/15 0814 (!) 144/62 mmHg 99.1 F (37.3 C) Oral 60 20 96 %   Current Weight  01/26/15 264 lb 8.8 oz (120 kg)     Intake/Output from previous day: 03/23 0701 - 03/24 0700 In: 410 [P.O.:240; I.V.:170] Out: 520 [Urine:450; Chest Tube:70]    PHYSICAL EXAM:  Heart: RRR Lungs: Clear Wound: Dressed and dry Chest tube: Mild tidaling but no obvious air leak    Lab Results: CBC: Recent Labs  01/27/15 0520 01/28/15 0530  WBC 6.5 6.1  HGB 12.6* 11.8*  HCT 39.6 36.2*  PLT 181 178   BMET:  Recent Labs  01/27/15 0520 01/28/15 0530  NA 136 135  K 4.1 4.0  CL 105 106  CO2 27 22  GLUCOSE 120* 106*  BUN 14 21  CREATININE 1.13 1.23  CALCIUM 8.4 8.6    PT/INR: No results for input(s): LABPROT, INR in the last 72 hours.    Assessment/Plan: S/P Procedure(s) (LRB): VIDEO BRONCHOSCOPY (N/A) RIGHT VIDEO  ASSISTED THORACOSCOPY (VATS)WEDGE RESECTION OF RIGHT UPPER LOBE, LYMPH NODE DISSECTION (Right) CXR with tiny apical ptx, CT with no significant air leak.  Hopefully can d/c 1 CT today. Will place to water seal for now. Continue ambulation, pulm toilet. HTN- BPs better, continue to watch and titrate meds as needed.   LOS: 2 days    COLLINS,GINA H 01/28/2015  One chest tube out  Today I have seen and examined Dakota Shannon and agree with the above assessment  and plan.  Grace Isaac MD Beeper [phone] Office [phone] 01/28/2015 8:14 AM

## 2015-01-28 NOTE — Op Note (Unsigned)
NAMEKENNEDY, BOHANON NO.:  [id number]  MEDICAL RECORD NO.:  [id number]  LOCATION:  [id number]                        FACILITY:  [facility]  PHYSICIAN:  Lanelle Bal, MD    DATE OF BIRTH:  1935-02-07  DATE OF PROCEDURE:  01/26/2015 DATE OF DISCHARGE:                              OPERATIVE REPORT   PREOPERATIVE DIAGNOSIS:  Right upper lobe lung nodule in a patient with renal mass and biopsied neck node for lymphoma.  POSTOPERATIVE DIAGNOSIS:  Right upper lobe lung nodule in a patient with renal mass and biopsied neck node for lymphoma.  SURGICAL PROCEDURE:  Bronchoscopy, right video-assisted thoracoscopy, mini thoracotomy, wedge resection of right upper lobe lung mass with lymph node dissection.  SURGEON:  Lanelle Bal, MD  FIRST ASSISTANT:  Suzzanne Cloud, PA  BRIEF HISTORY:  The patient is a 80 year old male who presented to his dentist with enlarging mass in the right submental area.  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  In evaluation of this, CT scan of chest and abdomen and PET scan was performed with hypermetabolic 1 cm nodule in the right upper lobe without any suggestion of nodal involvement.  In addition, there was a large left renal cyst and also a left small renal mass that was at least suspicious.  The patient was referred for biopsy and treatment of right upper lobe mass that was hypermetabolic and highly suspicious of malignancy.  Risks and treatment options were discussed with the patient in detail, proceeding with surgical resection was recommended to the patient who agreed and signed informed consent.  His pulmonary functions were somewhat limited in addition to his weight of almost 300 pounds. The patient agreed to proceeding with surgical resection and signed informed consent.  DESCRIPTION OF PROCEDURE:  With central line and arterial line in place, the patient underwent general endotracheal anesthesia.  Initially  a single-lumen endotracheal tube was placed and through this, a fiberoptic bronchoscope was passed to the subsegmental level without evidence of endobronchial lesions.  Scope was removed.  Double-lumen endotracheal tube was placed, and the patient was turned in lateral decubitus position with the right side up that had previously been marked.  The chest was prepped with Betadine and draped in usual sterile manner. Second time-out was performed.  We then proceeded with making 2 small access port incisions and the 30-degree video scope was introduced into the chest.  The lung was satisfactorily deflated to locate the third smaller incision higher through which we could palpate the mass through the 3 incisions.  We then proceeded with a very generous wedge resection of the peripherally placed right upper lobe lung nodule.  On the previous CT scan, there was an associated 4 mm potential nodule, but we could not palpate this.  With a series of purple staplers, we were able to satisfactorily resect the area of suspicion.  This frozen section was done on the margins of this.  The mass was identified, but not frozen. The closest margin was approximately 1.7 cm, all margins were negative. With the patient's somewhat limited pulmonary reserve, weight, age, we decided not to proceed with a full lobectomy.  We then proceeded with lymph node  dissection and 4R, 10 and 11R nodes were submitted separately for Pathology review.  Blood loss was minimal.  Two chest tubes were left in place through the port sites.  The small incision was closed with interrupted 0-Vicryl, running 3-0 Vicryl, and 3-0 subcuticular stitch.  Dermabond was applied.  The lung reinflated nicely without evidence of air leak.  The patient was awakened and extubated in the operating room and transferred to the recovery room for further postoperative care.  He tolerated the procedure without obvious complications.  Blood loss was  minimal.  Final path pending.     Lanelle Bal, MD     EG/MEDQ  D:  01/28/2015  T:  01/28/2015  Job:  [id number]

## 2015-01-29 ENCOUNTER — Inpatient Hospital Stay (HOSPITAL_COMMUNITY): Payer: Medicare Other

## 2015-01-29 NOTE — Discharge Instructions (Signed)
Thoracotomy, Care After Refer to this sheet in the next few weeks. These instructions provide you with information on caring for yourself after your procedure. Your health care provider may also give you more specific instructions. Your treatment has been planned according to current medical practices, but problems sometimes occur. Call your health care provider if you have any problems or questions after your procedure. WHAT TO EXPECT AFTER YOUR PROCEDURE After your procedure, it is typical to have the following sensations:  You may feel pain at the incision site.  You may be constipated from the pain medicine given and the change in your level of activity.  You may feel extremely tired. HOME CARE INSTRUCTIONS  Take over-the-counter or prescription medicines for pain, discomfort, or fever only as directed by your health care provider. It is very important to take pain relieving medicine before your pain becomes severe. You will be able to breathe and cough more comfortably if your pain is well controlled.  Take deep breaths. Deep breathing helps to keep your lungs inflated and protects against a lung infection (pneumonia).  Cough frequently. Even though coughing may cause discomfort, coughing is important to clear mucus (phlegm) and expand your lungs. Coughing helps prevent pneumonia. If it hurts to cough, hold a pillow against your chest when you cough. This may help with the discomfort.  Continue to use an incentive spirometer as directed. The use of an incentive spirometer helps to keep your lungs inflated and protects against pneumonia.  Change the bandages over your incision as needed or as directed by your health care provider.  Remove the bandages over your chest tube site as directed by your health care provider.  Resume your normal diet as directed. It is important to have adequate protein, calories, vitamins, and minerals to promote healing.  Prevent constipation.  Eat  high-fiber foods such as whole grain cereals and breads, brown rice, beans, and fresh fruits and vegetables.  Drink enough water and fluids to keep your urine clear or pale yellow. Avoid drinking beverages containing caffeine. Beverages containing caffeine can cause dehydration and harden your stool.  Talk to your health care provider about taking a stool softener or laxative.  Avoid lifting until you are instructed otherwise.  Do not drive until directed by your health care provider.  Do not drive while taking pain medicines (narcotics).  Do not bathe, swim, or use a hot tub until directed by your health care provider. You may shower instead. Gently wash the area of your incision with water and soap as directed. Do not use anything else to clean your incision except as directed by your health care provider.  Do not use any tobacco products including cigarettes, chewing tobacco, or electronic cigarettes.  Avoid secondhand smoke.  Schedule an appointment for stitch (suture) or staple removal as directed.  Schedule and attend all follow-up visits as directed by your health care provider. It is important to keep all your appointments.  Participate in pulmonary rehabilitation as directed by your health care provider.  Do not travel by airplane for 2 weeks after your chest tube is removed. SEEK MEDICAL CARE IF:  You are bleeding from your wounds.  Your heartbeat seems irregular.  You have redness, swelling, or increasing pain in the wounds.  There is pus coming from your wounds.  There is a bad smell coming from the wound or dressing.  You have a fever or chills.  You have nausea or are vomiting.  You have muscle aches. SEEK  IMMEDIATE MEDICAL CARE IF:  You have a rash.  You have difficulty breathing.  You have a reaction or side effect to medicines given.  You have persistent nausea.  You have lightheadedness or feel faint.  You have shortness of breath or chest  pain.  You have persistent pain. Document Released: 04/07/2011 Document Revised: 10/28/2013 Document Reviewed: 06/11/2013 [facility] Patient Information 2015 [place], [place]. This information is not intended to replace advice given to you by your health care provider. Make sure you discuss any questions you have with your health care provider.

## 2015-01-29 NOTE — Discharge Summary (Addendum)
Physician Discharge Summary  Patient ID: Dakota Shannon MRN: [id number] DOB/AGE: June 19, 1935 80 y.o.  Admit date: 01/26/2015 Discharge date: 01/29/2015  Admission Diagnoses:  Patient Active Problem List   Diagnosis Date Noted  . Lung mass- 01/26/2015  . 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 Cervical lymphadenopathy 12/11/2014  . Renal mass, right 12/11/2014  . Essential hypertension, benign 06/16/2010  . PURE HYPERCHOLESTEROLEMIA 04/18/2010  . 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 MURMUR 04/18/2010  . NONSPECIFIC ABNORMAL UNSPEC CV FUNCTION STUDY 04/18/2010   Discharge Diagnoses:   Patient Active Problem List   Diagnosis Date Noted  . 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  Lung cancer, right upper lobe   Staging form: Lung, AJCC 7th Edition     Pathologic stage from 02/01/2015: Stage IB (T2a, N0, cM0) - Signed by Grace Isaac, MD on 02/03/2015     01/26/2015  .  12/11/2014  . Cervical lymphadenopathy 12/11/2014  . Renal mass, right 12/11/2014  . Essential hypertension, benign 06/16/2010  . PURE HYPERCHOLESTEROLEMIA 04/18/2010  . 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 MURMUR 04/18/2010  . NONSPECIFIC ABNORMAL UNSPEC CV FUNCTION STUDY 04/18/2010   Discharged Condition: good  History of Present Illness:   Dakota Shannon is a 80 yo white male who was referred to [facility] with a right submandibular lymphadenopathy and a right upper lobe mass.  This was concerning for bronchogenic carcinoma.  He underwent a lymph node biopsy which was suggestive of a low grade lymphoma vs an inflammatory process.  The patient is asymptomatic without chest pain, shortness of breath, weight loss, fevers, or chills.  He was evaluated by Dr. Servando Snare.  He reviewed the patient's PET CTs can which was highly suspicious for clinical stage I  carcinoma of the lung.  He recommended the patient undergo bronchoscopy with Right VATS with resection of the mass.  The risks and benefits of the procedure were explained to the patient and he was agreeable to proceed.   Hospital Course:   Dakota Shannon presented to [facility]  He was taken to the operating room and underwent Video Bronchoscopy, Right VATS with Mini Thoracotomy and Wedge Resection of the right upper lobe and lymph node dissection.  He tolerated the procedure without difficulty, was extubated and taken to the SICU in stable condition.   The patient did well post operatively.  His chest tubes exhibited small air leak in the immediate post operative period.  He was hypertensive and his home blood pressure medications were resumed. His air leak resolved and his posterior chest tube was removed on POD #2.  The patients follow up CXR showed evidence of a small apical pneumothorax on the right.  Again the patient has a 1+ air leak with cough.  His CXR remained stable.  His air leak resolved and his final chest tube was removed on POD # 4.  He is medically stable at this time.  Repeat CXR will be obtained prior to discharge if this remains stable he will be stable for discharge on 3/272016.  Pathology remains pending.      Treatments: surgery:   Bronchoscopy, right video-assisted thoracoscopy, mini thoracotomy, wedge resection of right upper lobe lung mass with lymph node dissection.  Disposition: Home  Discharge Medications:   Medication List    TAKE these medications        aspirin EC 81 MG tablet  Take 81 mg  by mouth daily.     CENTRUM SILVER PO  Take 1 tablet by mouth daily.     digoxin 0.25 MG tablet  Commonly known as:  LANOXIN  Take 250 mcg by mouth daily.     gemfibrozil 600 MG tablet  Commonly known as:  LOPID  Take 600 mg by mouth 2 (two) times daily before a meal.     HYDROcodone-acetaminophen 5-500 MG per tablet  Commonly known as:  VICODIN  Take 1  tablet by mouth every 4 (four) hours as needed for pain.     isosorbide mononitrate 30 MG 24 hr tablet  Commonly known as:  IMDUR  TAKE 1 TABLET BY MOUTH DAILY - PATIENT NEEDS OFFICE VISIT     lisinopril 20 MG tablet  Commonly known as:  PRINIVIL,ZESTRIL  Take one tablet by mouth every morning & 1/2 tablet every evening     propranolol 80 MG tablet  Commonly known as:  INDERAL  Take 80 mg by mouth 2 (two) times daily.     tamsulosin 0.4 MG Caps capsule  Commonly known as:  FLOMAX  Take 0.4 mg by mouth daily.       Follow-up Information    Follow up with Grace Isaac, MD In 2 weeks.   Specialty:  Cardiothoracic Surgery   Why:  Office will contact you with appointment   Contact information:   [address] [place] [address] [phone]       Follow up with [facility] In 2 weeks.   Why:  Please get CXR 1 hour prior to appointment   Contact information:   [facility]        Signed: Ellwood Handler 01/29/2015, 10:47 AM

## 2015-01-29 NOTE — Progress Notes (Addendum)
      [address]       [place] [address]             [phone]      3 Days Post-Op Procedure(s) (LRB): VIDEO BRONCHOSCOPY (N/A) RIGHT VIDEO ASSISTED THORACOSCOPY (VATS)WEDGE RESECTION OF RIGHT UPPER LOBE, LYMPH NODE DISSECTION (Right)   Subjective:  Dakota Shannon has no complaints this morning.  + ambulation + BM  Objective: Vital signs in last 24 hours: Temp:  [87.9 F (31.1 C)-99 F (37.2 C)] 98.6 F (37 C) (03/25 0744) Pulse Rate:  [60-64] 61 (03/25 0348) Cardiac Rhythm:  [-] Normal sinus rhythm (03/25 0749) Resp:  [15-22] 15 (03/25 0747) BP: (115-134)/(46-58) 134/55 mmHg (03/25 0348) SpO2:  [94 %-98 %] 96 % (03/25 0747)  Intake/Output from previous day: 03/24 0701 - 03/25 0700 In: 120 [I.V.:120] Out: 930 [Urine:900; Chest Tube:30] Intake/Output this shift: Total I/O In: -  Out: 10 [Chest Tube:10]  General appearance: alert, cooperative and no distress Heart: regular rate and rhythm Lungs: clear to auscultation bilaterally Abdomen: soft, non-tender; bowel sounds normal; no masses,  no organomegaly Extremities: extremities normal, atraumatic, no cyanosis or edema Wound: clean and dry  Lab Results:  Recent Labs  01/27/15 0520 01/28/15 0530  WBC 6.5 6.1  HGB 12.6* 11.8*  HCT 39.6 36.2*  PLT 181 178   BMET:  Recent Labs  01/27/15 0520 01/28/15 0530  NA 136 135  K 4.1 4.0  CL 105 106  CO2 27 22  GLUCOSE 120* 106*  BUN 14 21  CREATININE 1.13 1.23  CALCIUM 8.4 8.6    PT/INR: No results for input(s): LABPROT, INR in the last 72 hours. ABG    Component Value Date/Time   PHART 7.385 01/27/2015 0424   HCO3 23.2 01/27/2015 0424   TCO2 24.4 01/27/2015 0424   ACIDBASEDEF 1.2 01/27/2015 0424   O2SAT 96.4 01/27/2015 0424   CBG (last 3)  No results for input(s): GLUCAP in the last 72 hours.  Assessment/Plan: S/P Procedure(s) (LRB): VIDEO BRONCHOSCOPY (N/A) RIGHT VIDEO ASSISTED THORACOSCOPY (VATS)WEDGE RESECTION OF RIGHT UPPER  LOBE, LYMPH NODE DISSECTION (Right)  1. Chest tube- +1 air leak with cough, CXR tiny right sided pneumothorax 2. CV- HTN improved, continue digoxin, Imdur, Lisinopril 3. Dispo- patient stable, leave chest tube on water seal today   LOS: 3 days    Ellwood Handler 01/29/2015  liklly d/c remaining chest tube in am Path still pending from lung resection, neck biopsy Hodgkin lymphoma    I have seen and examined Maia Breslow and agree with the above assessment  and plan.  Grace Isaac MD Beeper [phone] Office [phone] 01/29/2015 11:12 AM

## 2015-01-29 NOTE — Progress Notes (Signed)
PCA d/c; wasted in sink Fentanyl 57ml (276mcg).  Verified by Noreene Larsson, RN.

## 2015-01-30 ENCOUNTER — Inpatient Hospital Stay (HOSPITAL_COMMUNITY): Payer: Medicare Other

## 2015-01-30 NOTE — Progress Notes (Addendum)
[address]       [place] [address]             [phone]      4 Days Post-Op Procedure(s) (LRB): VIDEO BRONCHOSCOPY (N/A) RIGHT VIDEO ASSISTED THORACOSCOPY (VATS)WEDGE RESECTION OF RIGHT UPPER LOBE, LYMPH NODE DISSECTION (Right) Subjective: Feels ok, sore from chest tube  Objective: Vital signs in last 24 hours: Temp:  [98.2 F (36.8 C)-98.9 F (37.2 C)] 98.6 F (37 C) (03/26 0700) Pulse Rate:  [60-79] 78 (03/26 1003) Cardiac Rhythm:  [-] Normal sinus rhythm (03/25 2004) Resp:  [13-20] 19 (03/26 0838) BP: (106-150)/(47-69) 147/47 mmHg (03/26 0926) SpO2:  [95 %-98 %] 95 % (03/26 0838)  Hemodynamic parameters for last 24 hours:    Intake/Output from previous day: 03/25 0701 - 03/26 0700 In: 50 [I.V.:50] Out: 590 [Urine:550; Chest Tube:40] Intake/Output this shift: Total I/O In: 360 [P.O.:360] Out: 0   General appearance: alert, cooperative and no distress Heart: regular rate and rhythm Lungs: mildly dim in bases Abdomen: benign Extremities: no edema Wound: incis healing well  Lab Results:  Recent Labs  01/28/15 0530  WBC 6.1  HGB 11.8*  HCT 36.2*  PLT 178   BMET:  Recent Labs  01/28/15 0530  NA 135  K 4.0  CL 106  CO2 22  GLUCOSE 106*  BUN 21  CREATININE 1.23  CALCIUM 8.6    PT/INR: No results for input(s): LABPROT, INR in the last 72 hours. ABG    Component Value Date/Time   PHART 7.385 01/27/2015 0424   HCO3 23.2 01/27/2015 0424   TCO2 24.4 01/27/2015 0424   ACIDBASEDEF 1.2 01/27/2015 0424   O2SAT 96.4 01/27/2015 0424   CBG (last 3)  No results for input(s): GLUCAP in the last 72 hours.  Meds Scheduled Meds: . acetaminophen  1,000 mg Oral 4 times per day   Or  . acetaminophen (TYLENOL) oral liquid 160 mg/5 mL  1,000 mg Oral 4 times per day  . aspirin EC  81 mg Oral Daily  . bisacodyl  10 mg Oral Daily  . digoxin  250 mcg Oral Daily  . enoxaparin (LOVENOX) injection  40 mg Subcutaneous Q24H  .  gemfibrozil  600 mg Oral BID AC  . isosorbide mononitrate  30 mg Oral Daily  . lisinopril  10 mg Oral BID  . propranolol  80 mg Oral BID  . senna-docusate  1 tablet Oral QHS  . tamsulosin  0.4 mg Oral Daily   Continuous Infusions: . dextrose 5 % and 0.45 % NaCl with KCl 20 mEq/L 10 mL/hr at 01/27/15 0845   PRN Meds:.ondansetron (ZOFRAN) IV, oxyCODONE, polyethylene glycol, potassium chloride, traMADol  Xrays Dg Chest Port 1 View  01/30/2015   CLINICAL DATA:  Status post thoracotomy. Known right apical pneumothorax  EXAM: PORTABLE CHEST - 1 VIEW  COMPARISON:  January 29, 2015  FINDINGS: Central catheter tip is in the superior cava. Chest tube remains on the right. Right apical pneumothorax is stable. There is slight left base atelectasis. Lungs are otherwise clear. Heart is upper normal in size with pulmonary vascularity within normal limits. No adenopathy.  IMPRESSION: Stable right apical pneumothorax with right chest tube in place. Lungs are clear except for minimal left base atelectasis. No change in cardiac silhouette.   Electronically Signed   By: Lowella Grip III M.D.   On: 01/30/2015 08:01   Dg Chest Port 1 View  01/29/2015   CLINICAL DATA:  Chest tube.  Lung mass.  EXAM: PORTABLE CHEST - 1 VIEW  COMPARISON:  01/28/2015  FINDINGS: Right IJ central line in stable position with tip at the mid SVC level. 1 of 2 right-sided chest tubes remain. There has been a slight increase in a right apical pneumothorax, now 2 rib interspaces.  Stable generous heart size prominent aortic tortuosity.  Subsegmental atelectasis at the bases.  No edema or pneumonia.  IMPRESSION: Slight increase in a still small right apical pneumothorax.   Electronically Signed   By: Monte Fantasia M.D.   On: 01/29/2015 08:07    Assessment/Plan: S/P Procedure(s) (LRB): VIDEO BRONCHOSCOPY (N/A) RIGHT VIDEO ASSISTED THORACOSCOPY (VATS)WEDGE RESECTION OF RIGHT UPPER LOBE, LYMPH NODE DISSECTION (Right)  1 doing well, no air  leak on H20 seal , Right pntx is stable and is likely a space- D/C tube today- sats good on RA 2 some HTN- may need to increase ace inhibitor- monitor for now on home dose 3 no new labs 4 poss home in am  LOS: 4 days    Dakota Shannon,Dakota Shannon 01/30/2015  Patient seen and examined, agree with above CT just removed, repeat CXR pending  Remo Lipps C. Roxan Hockey, MD [facility] [phone]

## 2015-01-31 ENCOUNTER — Inpatient Hospital Stay (HOSPITAL_COMMUNITY): Payer: Medicare Other

## 2015-01-31 MED ORDER — HYDROCODONE-ACETAMINOPHEN 5-500 MG PO TABS: 1.0000 | ORAL_TABLET | ORAL | Status: DC | PRN

## 2015-01-31 NOTE — Progress Notes (Addendum)
[facility] DAILY ICU PROGRESS NOTE                   [address]            [place] [address]          [phone]   5 Days Post-Op Procedure(s) (LRB): VIDEO BRONCHOSCOPY (N/A) RIGHT VIDEO ASSISTED THORACOSCOPY (VATS)WEDGE RESECTION OF RIGHT UPPER LOBE, LYMPH NODE DISSECTION (Right)  Total Length of Stay:  LOS: 5 days   Subjective: Feels well, doing well with ambulation   Objective: Vital signs in last 24 hours: Temp:  [97.7 F (36.5 C)-99 F (37.2 C)] 97.9 F (36.6 C) (03/27 0802) Pulse Rate:  [53-78] 72 (03/27 0903) Cardiac Rhythm:  [-] Normal sinus rhythm (03/27 0802) Resp:  [16-25] 19 (03/27 0802) BP: (111-136)/(54-81) 129/69 mmHg (03/27 0903) SpO2:  [92 %-96 %] 96 % (03/27 0802)  Filed Weights   01/26/15 0720 01/26/15 1300  Weight: 267 lb (121.11 kg) 264 lb 8.8 oz (120 kg)    Weight change:    Hemodynamic parameters for last 24 hours:    Intake/Output from previous day: 03/26 0701 - 03/27 0700 In: 1560 [P.O.:1560] Out: 650 [Urine:650]  Intake/Output this shift:    Current Meds: Scheduled Meds: . acetaminophen  1,000 mg Oral 4 times per day   Or  . acetaminophen (TYLENOL) oral liquid 160 mg/5 mL  1,000 mg Oral 4 times per day  . aspirin EC  81 mg Oral Daily  . bisacodyl  10 mg Oral Daily  . digoxin  250 mcg Oral Daily  . enoxaparin (LOVENOX) injection  40 mg Subcutaneous Q24H  . gemfibrozil  600 mg Oral BID AC  . isosorbide mononitrate  30 mg Oral Daily  . lisinopril  10 mg Oral BID  . propranolol  80 mg Oral BID  . senna-docusate  1 tablet Oral QHS  . tamsulosin  0.4 mg Oral Daily   Continuous Infusions: . dextrose 5 % and 0.45 % NaCl with KCl 20 mEq/L 10 mL/hr at 01/27/15 0845   PRN Meds:.ondansetron (ZOFRAN) IV, oxyCODONE, polyethylene glycol, potassium chloride, traMADol  General appearance: alert, cooperative and no distress Heart: regular rate and rhythm Lungs: mildly dim in right base Abdomen: benign Extremities: no  edema Wound: incis healing well  Lab Results: CBC:No results for input(s): WBC, HGB, HCT, PLT in the last 72 hours. BMET: No results for input(s): NA, K, CL, CO2, GLUCOSE, BUN, CREATININE, CALCIUM in the last 72 hours.  PT/INR: No results for input(s): LABPROT, INR in the last 72 hours. Radiology: Dg Chest Port 1 View  01/30/2015   CLINICAL DATA:  Status post thoracotomy. Known right apical pneumothorax  EXAM: PORTABLE CHEST - 1 VIEW  COMPARISON:  January 29, 2015  FINDINGS: Central catheter tip is in the superior cava. Chest tube remains on the right. Right apical pneumothorax is stable. There is slight left base atelectasis. Lungs are otherwise clear. Heart is upper normal in size with pulmonary vascularity within normal limits. No adenopathy.  IMPRESSION: Stable right apical pneumothorax with right chest tube in place. Lungs are clear except for minimal left base atelectasis. No change in cardiac silhouette.   Electronically Signed   By: Lowella Grip III M.D.   On: 01/30/2015 08:01   Dg Chest Port 1v Same Day  01/30/2015   CLINICAL DATA:  80 year old male with a history of lobectomy. Status post chest tube removal.  EXAM: PORTABLE CHEST - 1 VIEW SAME DAY  COMPARISON:  01/30/2015, 01/29/2015  FINDINGS: Cardiomediastinal silhouette unchanged in size and contour. Tortuosity descending thoracic aorta.  There is more pronounced density in the right peritracheal region at the apex the right lung and base the neck. This may be attributable to differing patient position.  Interval removal of right-sided thoracostomy tube.  Similar size of right-sided pneumothorax.  No confluent airspace disease.  IMPRESSION: Status post right thoracostomy tube removal, small right pneumothorax is unchanged.  Density at the right peritracheal region is more pronounced than on the prior, potentially related to the patient positioning/projection. The patient has prior cross-sectional imaging demonstrating nodular changes of  the thyroid.  Signed,  Dulcy Fanny. Earleen Newport, DO  [facility]  [facility]   Electronically Signed   By: Corrie Mckusick D.O.   On: 01/30/2015 11:34     Assessment/Plan: S/P Procedure(s) (LRB): VIDEO BRONCHOSCOPY (N/A) RIGHT VIDEO ASSISTED THORACOSCOPY (VATS)WEDGE RESECTION OF RIGHT UPPER LOBE, LYMPH NODE DISSECTION (Right)  1 conts to do well- stable for d/c today     GOLD,WAYNE E 01/31/2015 9:29 AM Patient seen and examined, agree with above  Remo Lipps C. Roxan Hockey, MD [facility] [phone]

## 2015-01-31 NOTE — Progress Notes (Signed)
Discharge instructions reviewed and prescription handed to patient with daughter being present in the room. Daughter and patient verbalized understanding of discharge instructions and all questions were answered. VSS and patient was in no acute disrtess. Taken via wheelchair to the patients daughters car.

## 2015-02-01 ENCOUNTER — Encounter (HOSPITAL_COMMUNITY): Payer: Self-pay | Admitting: Cardiothoracic Surgery

## 2015-02-05 ENCOUNTER — Telehealth: Payer: Self-pay | Admitting: *Deleted

## 2015-02-10 ENCOUNTER — Other Ambulatory Visit: Payer: Self-pay | Admitting: Cardiothoracic Surgery

## 2015-02-10 DIAGNOSIS — R918 Other nonspecific abnormal finding of lung field: Secondary | ICD-10-CM

## 2015-02-12 ENCOUNTER — Ambulatory Visit
Admission: RE | Admit: 2015-02-12 | Discharge: 2015-02-12 | Disposition: A | Discharge status: Home / Self Care | Payer: Medicare Other | Source: Ambulatory Visit | Attending: Cardiothoracic Surgery | Admitting: Cardiothoracic Surgery

## 2015-02-12 ENCOUNTER — Ambulatory Visit: Payer: Self-pay | Admitting: Cardiothoracic Surgery

## 2015-02-12 ENCOUNTER — Encounter (INDEPENDENT_AMBULATORY_CARE_PROVIDER_SITE_OTHER): Payer: Self-pay

## 2015-02-12 DIAGNOSIS — R918 Other nonspecific abnormal finding of lung field: Secondary | ICD-10-CM

## 2015-02-12 DIAGNOSIS — C3410 Malignant neoplasm of upper lobe, unspecified bronchus or lung: Secondary | ICD-10-CM

## 2015-02-15 ENCOUNTER — Ambulatory Visit: Payer: Self-pay

## 2015-06-21 ENCOUNTER — Other Ambulatory Visit: Payer: Self-pay | Admitting: Cardiology

## 2015-07-29 ENCOUNTER — Encounter: Payer: Self-pay | Admitting: *Deleted

## 2015-07-30 ENCOUNTER — Ambulatory Visit (INDEPENDENT_AMBULATORY_CARE_PROVIDER_SITE_OTHER): Payer: Medicare Other | Admitting: Cardiology

## 2015-07-30 ENCOUNTER — Encounter: Payer: Self-pay | Admitting: Cardiology

## 2015-07-30 VITALS — BP 147/83 | HR 57 | Ht 71.0 in | Wt 267.0 lb

## 2015-07-30 DIAGNOSIS — R010 Benign and innocent cardiac murmurs: Secondary | ICD-10-CM | POA: Diagnosis not present

## 2015-07-30 DIAGNOSIS — R011 Cardiac murmur, unspecified: Secondary | ICD-10-CM

## 2015-07-30 DIAGNOSIS — I456 Pre-excitation syndrome: Secondary | ICD-10-CM | POA: Diagnosis not present

## 2015-07-30 NOTE — Progress Notes (Signed)
Cardiology Office Note  Date: 07/30/2015   ID: CAMARA ROSANDER, DOB 29-Nov-1934, MRN [id number]  PCP: [name]., MD  Primary Cardiologist: Rozann Lesches, MD   Chief Complaint  Patient presents with  . History of WPW    History of Present Illness: Dakota Shannon is a 80 y.o. male last seen in January. Interval records reviewed. Back in March he underwent VATS with resection of a right upper lobe mass with surgical pathology indicating invasive adenocarcinoma. He follows in the cancer center here in [place]. He has not required any additional treatments as far as chemotherapy. Plan is surveillance imaging.  From a cardiac perspective, he has been stable without any significant palpitations or chest pain. I reviewed his medications which include Inderal and Lanoxin as before.  Echocardiogram from earlier in the year is reviewed below.   Past Medical History  Diagnosis Date  . Atrial fibrillation     Possible history based on limited information  . Mixed hyperlipidemia   . Essential hypertension, benign   . Wolff-Parkinson-White (WPW) syndrome     Possible history based on limited information  . Arthritis   . Diverticulosis   . Swallowing difficulty   . Hodgkin's lymphoma   . Adenocarcinoma of lung     Resected March 2016    Current Outpatient Prescriptions  Medication Sig Dispense Refill  . aspirin EC 81 MG tablet Take 81 mg by mouth daily.      . digoxin (LANOXIN) 0.125 MG tablet Take 0.125 mg by mouth daily.    Marland Kitchen gemfibrozil (LOPID) 600 MG tablet Take 600 mg by mouth 2 (two) times daily before a meal.      . HYDROcodone-acetaminophen (VICODIN) 5-500 MG per tablet Take 1 tablet by mouth every 4 (four) hours as needed for pain. 50 tablet 0  . isosorbide mononitrate (IMDUR) 30 MG 24 hr tablet TAKE 1 TABLET BY MOUTH EVERY DAY 30 tablet 1  . lisinopril (PRINIVIL,ZESTRIL) 20 MG tablet Take one tablet by mouth every morning & 1/2 tablet every evening    . Multiple  Vitamins-Minerals (CENTRUM SILVER PO) Take 1 tablet by mouth daily.      . propranolol (INDERAL) 80 MG tablet Take 80 mg by mouth 2 (two) times daily.       No current facility-administered medications for this visit.    Allergies:  Review of patient's allergies indicates no known allergies.   Social History: The patient  reports that he quit smoking about 35 years ago. His smoking use included Cigarettes. He has a 20 pack-year smoking history. He has never used smokeless tobacco. He reports that he does not drink alcohol or use illicit drugs.   ROS:  Please see the history of present illness. Otherwise, complete review of systems is positive for none.  All other systems are reviewed and negative.   Physical Exam: VS:  BP 147/83 mmHg  Pulse 57  Ht '5\' 11"'$  (1.803 m)  Wt 267 lb (121.11 kg)  BMI 37.26 kg/m2  SpO2 98%, BMI Body mass index is 37.26 kg/(m^2).  Wt Readings from Last 3 Encounters:  07/30/15 267 lb (121.11 kg)  01/26/15 264 lb 8.8 oz (120 kg)  01/22/15 267 lb 6.4 oz (121.292 kg)     General: Patient appears comfortable at rest. HEENT: Conjunctiva and lids normal, oropharynx clear with moist mucosa. Neck: Supple, no elevated JVP or carotid bruits. Lungs: Clear to auscultation, nonlabored breathing at rest. Cardiac: Regular rate and rhythm, no S3 or significant  systolic murmur, no pericardial rub. Abdomen: Soft, nontender, no hepatomegaly, bowel sounds present, no guarding or rebound. Extremities: No pitting edema, distal pulses 2+.   ECG: Tracing from 01/22/2015 showed sinus bradycardia with nonspecific ST-T changes.   Recent Labwork: 01/28/2015: ALT 15; AST 20; BUN 21; Creatinine, Ser 1.23; Hemoglobin 11.8*; Platelets 178; Potassium 4.0; Sodium 135   Other Studies Reviewed Today:  Echocardiogram 12/03/2014: Study Conclusions  - Procedure narrative: Transthoracic echocardiography for left ventricular function evaluation, for right ventricular  function evaluation, and for assessment of valvular function. Image quality was suboptimal, with poor endocardial and valvular visualization. - Left ventricle: The cavity size was normal. There was mild concentric hypertrophy. Systolic function was normal. The estimated ejection fraction was in the range of 55% to 60%. Images were inadequate for LV wall motion assessment. Doppler parameters are consistent with abnormal left ventricular relaxation (grade 1 diastolic dysfunction). - Aortic valve: Poorly visualized. Mildly calcified annulus. - Aorta: Mild aortic root dilatation. Aortic root dimension: 40 mm (ED). - Mitral valve: There was trivial regurgitation. - Left atrium: The atrium was mildly dilated. Volume/bsa, S: 29.7 ml/m^2.   Assessment and Plan:  1. History of WPW without active symptoms, ECG has been stable. No arrhythmias documented. Continue current medications and observation.  2. Mildly calcified aortic valve with mild aortic root dilatation by echocardiography. Asymptomatic.  Current medicines were reviewed with the patient today.  Disposition: FU with me in 6 months.   Signed, Satira Sark, MD, [facility] 07/30/2015 2:26 PM    [facility] [place], [place],  [address] Phone: [phone]; Fax: [phone]

## 2015-07-30 NOTE — Patient Instructions (Signed)
Your physician recommends that you continue on your current medications as directed. Please refer to the Current Medication list given to you today. Your physician recommends that you schedule a follow-up appointment in: 6 months. You will receive a reminder letter in the mail in about 4 months reminding you to call and schedule your appointment. If you don't receive this letter, please contact our office. 

## 2015-08-19 ENCOUNTER — Other Ambulatory Visit: Payer: Self-pay | Admitting: Cardiology

## 2015-08-27 ENCOUNTER — Other Ambulatory Visit (HOSPITAL_COMMUNITY): Payer: Self-pay | Admitting: Internal Medicine

## 2015-08-27 ENCOUNTER — Other Ambulatory Visit: Payer: Self-pay | Admitting: Nurse Practitioner

## 2015-08-27 DIAGNOSIS — C3411 Malignant neoplasm of upper lobe, right bronchus or lung: Secondary | ICD-10-CM

## 2015-09-07 ENCOUNTER — Ambulatory Visit (HOSPITAL_COMMUNITY)
Admission: RE | Admit: 2015-09-07 | Discharge: 2015-09-07 | Disposition: A | Discharge status: Home / Self Care | Payer: Medicare Other | Source: Ambulatory Visit | Attending: Nurse Practitioner | Admitting: Nurse Practitioner

## 2015-09-07 DIAGNOSIS — R911 Solitary pulmonary nodule: Secondary | ICD-10-CM | POA: Insufficient documentation

## 2015-09-07 DIAGNOSIS — C8191 Hodgkin lymphoma, unspecified, lymph nodes of head, face, and neck: Secondary | ICD-10-CM | POA: Insufficient documentation

## 2015-09-07 DIAGNOSIS — I700 Atherosclerosis of aorta: Secondary | ICD-10-CM | POA: Insufficient documentation

## 2015-09-07 DIAGNOSIS — K8020 Calculus of gallbladder without cholecystitis without obstruction: Secondary | ICD-10-CM | POA: Diagnosis not present

## 2015-09-07 DIAGNOSIS — E042 Nontoxic multinodular goiter: Secondary | ICD-10-CM | POA: Diagnosis not present

## 2015-09-07 DIAGNOSIS — D3502 Benign neoplasm of left adrenal gland: Secondary | ICD-10-CM | POA: Insufficient documentation

## 2015-09-07 DIAGNOSIS — K5730 Diverticulosis of large intestine without perforation or abscess without bleeding: Secondary | ICD-10-CM | POA: Insufficient documentation

## 2015-09-07 DIAGNOSIS — K4020 Bilateral inguinal hernia, without obstruction or gangrene, not specified as recurrent: Secondary | ICD-10-CM | POA: Insufficient documentation

## 2015-09-07 DIAGNOSIS — I2510 Atherosclerotic heart disease of native coronary artery without angina pectoris: Secondary | ICD-10-CM | POA: Insufficient documentation

## 2015-09-07 DIAGNOSIS — N281 Cyst of kidney, acquired: Secondary | ICD-10-CM | POA: Diagnosis not present

## 2015-09-07 DIAGNOSIS — C3411 Malignant neoplasm of upper lobe, right bronchus or lung: Secondary | ICD-10-CM | POA: Insufficient documentation

## 2015-09-07 LAB — GLUCOSE, CAPILLARY: Glucose-Capillary: 89 mg/dL (ref 65–99)

## 2015-09-07 MED ORDER — FLUDEOXYGLUCOSE F - 18 (FDG) INJECTION
12.9000 | Freq: Once | INTRAVENOUS | Status: DC | PRN
  Administered 2015-09-07: 12.9 via INTRAVENOUS
  Filled 2015-09-07: qty 12.9

## 2016-02-01 ENCOUNTER — Ambulatory Visit (INDEPENDENT_AMBULATORY_CARE_PROVIDER_SITE_OTHER): Payer: Medicare Other | Admitting: Cardiology

## 2016-02-01 ENCOUNTER — Encounter: Payer: Self-pay | Admitting: Cardiology

## 2016-02-01 VITALS — BP 148/78 | HR 68 | Ht 71.0 in | Wt 272.2 lb

## 2016-02-01 DIAGNOSIS — R011 Cardiac murmur, unspecified: Secondary | ICD-10-CM | POA: Diagnosis not present

## 2016-02-01 DIAGNOSIS — I456 Pre-excitation syndrome: Secondary | ICD-10-CM | POA: Diagnosis not present

## 2016-02-01 NOTE — Progress Notes (Signed)
Cardiology Office Note  Date: 02/01/2016   ID: EVO ADERMAN, DOB Oct 01, 1935, MRN [id number]  PCP: [name]., MD  Primary Cardiologist: Rozann Lesches, MD   Chief Complaint  Patient presents with  . Cardiac follow-up    History of Present Illness: Dakota Shannon is a 81 y.o. male last seen in September 2016. He presents for a routine follow-up visit. From a cardiac perspective, he has had no palpitations or chest pain, reports stable NYHA class II dyspnea. He has been less active over the winter, usually enjoys walking outdoors for exercise.  ECG today shows sinus rhythm with leftward axis and nonspecific T-wave changes, normal PR interval.  I reviewed his medications which include Lanoxin and propranolol.  Past Medical History  Diagnosis Date  . Atrial fibrillation ([place])     Possible history based on limited information  . Mixed hyperlipidemia   . Essential hypertension, benign   . Wolff-Parkinson-White (WPW) syndrome     Possible history based on limited information  . Arthritis   . Diverticulosis   . Swallowing difficulty   . Hodgkin's lymphoma ([place])   . Adenocarcinoma of lung [facility])     Resected March 2016    Past Surgical History  Procedure Laterality Date  . Neck lesion biopsy    . Skin cancer excision    . Video bronchoscopy N/A 01/26/2015    Procedure: VIDEO BRONCHOSCOPY;  Surgeon: Grace Isaac, MD;  Location: [facility] OR;  Service: Thoracic;  Laterality: N/A;  . Video assisted thoracoscopy (vats)/wedge resection Right 01/26/2015    Procedure: RIGHT VIDEO ASSISTED THORACOSCOPY (VATS)WEDGE RESECTION OF RIGHT UPPER LOBE, LYMPH NODE DISSECTION;  Surgeon: Grace Isaac, MD;  Location: [facility];  Service: Thoracic;  Laterality: Right;    Current Outpatient Prescriptions  Medication Sig Dispense Refill  . aspirin EC 81 MG tablet Take 81 mg by mouth daily.      . digoxin (LANOXIN) 0.125 MG tablet Take 0.125 mg by mouth daily.    Marland Kitchen gemfibrozil (LOPID) 600  MG tablet Take 600 mg by mouth 2 (two) times daily before a meal.      . HYDROcodone-acetaminophen (VICODIN) 5-500 MG per tablet Take 1 tablet by mouth every 4 (four) hours as needed for pain. 50 tablet 0  . isosorbide mononitrate (IMDUR) 30 MG 24 hr tablet TAKE 1 TABLET BY MOUTH EVERY DAY --PATIENT NEEDS OFFICE VISIT-- 30 tablet 6  . lisinopril (PRINIVIL,ZESTRIL) 20 MG tablet Take one tablet by mouth every morning & 1/2 tablet every evening    . Multiple Vitamins-Minerals (CENTRUM SILVER PO) Take 1 tablet by mouth daily.      . propranolol (INDERAL) 80 MG tablet Take 80 mg by mouth 2 (two) times daily.       No current facility-administered medications for this visit.   Allergies:  Review of patient's allergies indicates no known allergies.   Social History: The patient  reports that he quit smoking about 36 years ago. His smoking use included Cigarettes. He has a 20 pack-year smoking history. He has never used smokeless tobacco. He reports that he does not drink alcohol or use illicit drugs.   ROS:  Please see the history of present illness. Otherwise, complete review of systems is positive for recent left eye cataract surgery.  All other systems are reviewed and negative.   Physical Exam: VS:  BP 148/78 mmHg  Pulse 68  Ht '5\' 11"'$  (1.803 m)  Wt 272 lb 3.2 oz (123.469 kg)  BMI  37.98 kg/m2  SpO2 96%, BMI Body mass index is 37.98 kg/(m^2).  Wt Readings from Last 3 Encounters:  02/01/16 272 lb 3.2 oz (123.469 kg)  07/30/15 267 lb (121.11 kg)  01/26/15 264 lb 8.8 oz (120 kg)    General: Patient appears comfortable at rest. HEENT: Conjunctiva and lids normal, oropharynx clear with moist mucosa. Neck: Supple, no elevated JVP or carotid bruits. Lungs: Clear to auscultation, nonlabored breathing at rest. Cardiac: Regular rate and rhythm, no S3 or significant systolic murmur, no pericardial rub. Abdomen: Soft, nontender, no hepatomegaly, bowel sounds present, no guarding or  rebound. Extremities: No pitting edema, distal pulses 2+.  ECG: I personally reviewed the prior tracing from 01/22/2015 which showed sinus bradycardia with nonspecific ST-T changes.  Recent Labwork: March 2016: Hemoglobin 11.8, platelets 178, potassium 4.0, BUN 21, currently 1.2, AST 20, ALT 15  Other Studies Reviewed Today:  Echocardiogram 12/03/2014: Study Conclusions  - Procedure narrative: Transthoracic echocardiography for left ventricular function evaluation, for right ventricular function evaluation, and for assessment of valvular function. Image quality was suboptimal, with poor endocardial and valvular visualization. - Left ventricle: The cavity size was normal. There was mild concentric hypertrophy. Systolic function was normal. The estimated ejection fraction was in the range of 55% to 60%. Images were inadequate for LV wall motion assessment. Doppler parameters are consistent with abnormal left ventricular relaxation (grade 1 diastolic dysfunction). - Aortic valve: Poorly visualized. Mildly calcified annulus. - Aorta: Mild aortic root dilatation. Aortic root dimension: 40 mm (ED). - Mitral valve: There was trivial regurgitation. - Left atrium: The atrium was mildly dilated. Volume/bsa, S: 29.7 ml/m^2.  Assessment and Plan:  1. History of WPW without active symptomatology. ECG reviewed and stable. He has had no recent documented arrhythmias.  2. Aortic valve calcification with mildly dilated aortic root by echocardiogram last year. No aortic stenosis.  Current medicines were reviewed with the patient today.   Orders Placed This Encounter  Procedures  . EKG 12-Lead    Disposition: FU with me in 6 months.   Signed, Satira Sark, MD, [facility] 02/01/2016 11:09 AM    [facility] [place], [place], [place] [address] Phone: [phone]; Fax: [phone]

## 2016-02-01 NOTE — Patient Instructions (Signed)
Your physician recommends that you continue on your current medications as directed. Please refer to the Current Medication list given to you today. Your physician recommends that you schedule a follow-up appointment in: 6 months. You will receive a reminder letter in the mail in about 4 months reminding you to call and schedule your appointment. If you don't receive this letter, please contact our office. 

## 2016-03-06 ENCOUNTER — Encounter: Payer: Self-pay | Admitting: Internal Medicine

## 2016-03-16 ENCOUNTER — Other Ambulatory Visit: Payer: Self-pay | Admitting: Cardiology

## 2016-03-29 ENCOUNTER — Encounter: Payer: Self-pay | Admitting: Internal Medicine

## 2016-07-31 ENCOUNTER — Encounter: Payer: Self-pay | Admitting: *Deleted

## 2016-08-01 ENCOUNTER — Ambulatory Visit (INDEPENDENT_AMBULATORY_CARE_PROVIDER_SITE_OTHER): Payer: Medicare Other | Admitting: Cardiology

## 2016-08-01 ENCOUNTER — Encounter: Payer: Self-pay | Admitting: Cardiology

## 2016-08-01 VITALS — BP 144/78 | HR 55 | Ht 71.0 in | Wt 272.0 lb

## 2016-08-01 DIAGNOSIS — I456 Pre-excitation syndrome: Secondary | ICD-10-CM

## 2016-08-01 DIAGNOSIS — R011 Cardiac murmur, unspecified: Secondary | ICD-10-CM

## 2016-08-01 DIAGNOSIS — I77810 Thoracic aortic ectasia: Secondary | ICD-10-CM | POA: Diagnosis not present

## 2016-08-01 NOTE — Progress Notes (Signed)
Cardiology Office Note  Date: 08/01/2016   ID: Dakota Shannon, DOB 1935-04-22, MRN [id number]  PCP: [name], MD  Primary Cardiologist: Rozann Lesches, MD   Chief Complaint  Patient presents with  . Wolff-Parkinson-White Syndrome  . Hypertension    History of Present Illness: Dakota Shannon is a 81 y.o. male last seen in March. He presents for a routine follow-up visit. Since last encounter he has not had any chest pain or palpitations, no syncope. He underwent umbilical hernia surgery with Dr. Anthony Sar back in the summer months.  He has not been exercising, weight has gone up. We discussed diet and walking plan. He was not doing as much after having recent hernia surgery, but plans to get back outside walking when the weather cools off.  I reviewed his medications. He continues on Lanoxin, Inderal, and lisinopril.  Past Medical History:  Diagnosis Date  . Adenocarcinoma of lung [facility])    Resected March 2016  . Arthritis   . Atrial fibrillation ([place])    Possible history based on limited information  . Diverticulosis   . Essential hypertension, benign   . Hodgkin's lymphoma ([place])   . Mixed hyperlipidemia   . Swallowing difficulty   . Wolff-Parkinson-White (WPW) syndrome    Possible history based on limited information    Current Outpatient Prescriptions  Medication Sig Dispense Refill  . aspirin EC 81 MG tablet Take 81 mg by mouth daily.      . digoxin (LANOXIN) 0.125 MG tablet Take 0.125 mg by mouth daily.    Marland Kitchen gemfibrozil (LOPID) 600 MG tablet Take 600 mg by mouth 2 (two) times daily before a meal.      . HYDROcodone-acetaminophen (VICODIN) 5-500 MG per tablet Take 1 tablet by mouth every 4 (four) hours as needed for pain. 50 tablet 0  . isosorbide mononitrate (IMDUR) 30 MG 24 hr tablet TAKE 1 TABLET BY MOUTH EVERY DAY 30 tablet 6  . lisinopril (PRINIVIL,ZESTRIL) 20 MG tablet Take one tablet by mouth every morning & 1/2 tablet every evening    . Multiple  Vitamins-Minerals (CENTRUM SILVER PO) Take 1 tablet by mouth daily.      . propranolol (INDERAL) 80 MG tablet Take 80 mg by mouth 2 (two) times daily.       No current facility-administered medications for this visit.    Allergies:  Review of patient's allergies indicates no known allergies.   Social History: The patient  reports that he quit smoking about 36 years ago. His smoking use included Cigarettes. He has a 20.00 pack-year smoking history. He has never used smokeless tobacco. He reports that he does not drink alcohol or use drugs.   ROS:  Please see the history of present illness. Otherwise, complete review of systems is positive for decreased hearing.  All other systems are reviewed and negative.   Physical Exam: VS:  BP (!) 144/78   Pulse (!) 55   Ht '5\' 11"'$  (1.803 m)   Wt 272 lb (123.4 kg)   SpO2 95%   BMI 37.94 kg/m , BMI Body mass index is 37.94 kg/m.  Wt Readings from Last 3 Encounters:  08/01/16 272 lb (123.4 kg)  02/01/16 272 lb 3.2 oz (123.5 kg)  07/30/15 267 lb (121.1 kg)    General: Obese male, appears comfortable at rest. HEENT: Conjunctiva and lids normal, oropharynx clear with moist mucosa. Neck: Supple, no elevated JVP or carotid bruits. Lungs: Clear to auscultation, nonlabored breathing at rest. Cardiac:  Regular rate and rhythm, no S3 or significant systolic murmur, no pericardial rub. Abdomen: Soft, nontender, no hepatomegaly, bowel sounds present, no guarding or rebound. Extremities: No pitting edema, distal pulses 2+.  ECG: I personally reviewed the tracing from 02/01/2016 which showed sinus rhythm with leftward axis and nonspecific T-wave changes.  Recent Labwork:  August 2017: Hemoglobin 13.8, platelets 219, BUN 18, creatinine 1.0, potassium 4.8, AST 20, ALT 15, cholesterol 165, triglycerides 179, HDL 31, LDL 98, TSH 0.5  Other Studies Reviewed Today:  Echocardiogram 12/03/2014: Study Conclusions  - Procedure narrative: Transthoracic  echocardiography for left ventricular function evaluation, for right ventricular function evaluation, and for assessment of valvular function. Image quality was suboptimal, with poor endocardial and valvular visualization. - Left ventricle: The cavity size was normal. There was mild concentric hypertrophy. Systolic function was normal. The estimated ejection fraction was in the range of 55% to 60%. Images were inadequate for LV wall motion assessment. Doppler parameters are consistent with abnormal left ventricular relaxation (grade 1 diastolic dysfunction). - Aortic valve: Poorly visualized. Mildly calcified annulus. - Aorta: Mild aortic root dilatation. Aortic root dimension: 40 mm (ED). - Mitral valve: There was trivial regurgitation. - Left atrium: The atrium was mildly dilated. Volume/bsa, S: 29.7 ml/m^2.  Assessment and Plan:  1. History of WPW, quiescent on medical therapy. No changes were made today.  2. Mild aortic root dilatation by echocardiography last year, asymptomatic.  3. Essential hypertension, blood pressure mildly elevated today. He continues on lisinopril.  Current medicines were reviewed with the patient today.  Disposition: Follow-up with me in 6 months.  Signed, Satira Sark, MD, [facility] 08/01/2016 3:07 PM    [facility] [place], [place], [place] [address] Phone: [phone]; Fax: [phone]

## 2016-08-01 NOTE — Patient Instructions (Signed)

## 2016-09-12 ENCOUNTER — Ambulatory Visit (HOSPITAL_COMMUNITY): Payer: Medicare Other | Admitting: Oncology

## 2016-09-14 ENCOUNTER — Encounter (HOSPITAL_COMMUNITY): Payer: Medicare Other | Attending: Oncology | Admitting: Oncology

## 2016-09-14 ENCOUNTER — Encounter (HOSPITAL_COMMUNITY): Payer: Medicare Other

## 2016-09-14 ENCOUNTER — Encounter (HOSPITAL_COMMUNITY): Payer: Self-pay | Admitting: Oncology

## 2016-09-14 VITALS — BP 134/66 | HR 57 | Temp 98.5°F | Resp 18 | Ht 71.0 in | Wt 269.7 lb

## 2016-09-14 DIAGNOSIS — Z801 Family history of malignant neoplasm of trachea, bronchus and lung: Secondary | ICD-10-CM | POA: Diagnosis not present

## 2016-09-14 DIAGNOSIS — Z87891 Personal history of nicotine dependence: Secondary | ICD-10-CM

## 2016-09-14 DIAGNOSIS — C3411 Malignant neoplasm of upper lobe, right bronchus or lung: Secondary | ICD-10-CM

## 2016-09-14 DIAGNOSIS — E042 Nontoxic multinodular goiter: Secondary | ICD-10-CM | POA: Diagnosis present

## 2016-09-14 LAB — COMPREHENSIVE METABOLIC PANEL
ALK PHOS: 65 U/L (ref 38–126)
ALT: 17 U/L (ref 17–63)
AST: 20 U/L (ref 15–41)
Albumin: 4.2 g/dL (ref 3.5–5.0)
Anion gap: 7 (ref 5–15)
BILIRUBIN TOTAL: 0.7 mg/dL (ref 0.3–1.2)
BUN: 22 mg/dL — ABNORMAL HIGH (ref 6–20)
CO2: 25 mmol/L (ref 22–32)
Calcium: 9.2 mg/dL (ref 8.9–10.3)
Chloride: 104 mmol/L (ref 101–111)
Creatinine, Ser: 1.18 mg/dL (ref 0.61–1.24)
GFR calc Af Amer: >60 mL/min (ref >60)
GFR calc non Af Amer: 57 mL/min — ABNORMAL LOW (ref >60)
GLUCOSE: 94 mg/dL (ref 65–99)
Potassium: 4.4 mmol/L (ref 3.5–5.1)
Sodium: 136 mmol/L (ref 135–145)
TOTAL PROTEIN: 7.5 g/dL (ref 6.5–8.1)

## 2016-09-14 NOTE — Patient Instructions (Signed)
[place] at [facility] Discharge Instructions  RECOMMENDATIONS MADE BY THE CONSULTANT AND ANY TEST RESULTS WILL BE SENT TO YOUR REFERRING PHYSICIAN.  Your scan from May did now show anything (no sign of cancer). You will need another scan sometime this month (November).  You can call [phone] and ask for your CT scan results.  Your ultrasound biopsy report showed no sign of cancer. However, the doctor who read the report said that you did need another ultrasound of your thyroid to follow up on it.   We will contact [facility] pathology department for your pathology results on your thyroid biopsy.  You have a goiter. It is not cancer. But it needs further check up with ultrasound.    Return in 6 months to see Dr. Whitney Muse here in the [place]. Labs the same day. Labs are done on the first floor. Register at the main entrance desk. They will tell you where to sit.         Thank you for choosing [place] at [facility] to provide your oncology and hematology care.  To afford each patient quality time with our provider, please arrive at least 15 minutes before your scheduled appointment time.   Beginning January 23rd 2017 lab work for the [facility] will be done in the  Main lab at [facility] on 1st floor. If you have a lab appointment with the [facility] please come in thru the  Main Entrance and check in at the main information desk  You need to re-schedule your appointment should you arrive 10 or more minutes late.  We strive to give you quality time with our providers, and arriving late affects you and other patients whose appointments are after yours.  Also, if you no show three or more times for appointments you may be dismissed from the clinic at the providers discretion.     Again, thank you for choosing [facility].  Our hope is that these requests will decrease the amount of time that you wait before  being seen by our physicians.       _____________________________________________________________  Should you have questions after your visit to [facility], please contact our office at [phone] between the hours of 8:30 a.m. and 4:30 p.m.  Voicemails left after 4:30 p.m. will not be returned until the following business day.  For prescription refill requests, have your pharmacy contact our office.         Resources For Cancer Patients and their Caregivers ? American Cancer Society: Can assist with transportation, wigs, general needs, runs Look Good Feel Better.        602-688-7887 ? Cancer Care: Provides financial assistance, online support groups, medication/co-pay assistance.  1-800-813-HOPE (914) 554-8878) ? [facility] Assists [place] cancer patients and their families through emotional , educational and financial support.  (220)439-7466 ? [place] DSS Where to apply for food stamps, Medicaid and utility assistance. (810)333-8814 ? [facility]: Transportation to medical appointments. 763-704-4386 ? Social Security Administration: May apply for disability if have a Stage IV cancer. 317-743-5102 231-410-7110 ? [employer], Disability and Transit Services: Assists with nutrition, care and transit needs. [place] Support Programs: '@10RELATIVEDAYS'$ @ > Cancer Support Group  2nd Tuesday of the month 1pm-2pm, Journey Room  > Creative Journey  3rd Tuesday of the month 1130am-1pm, Journey Room  > Look Good Feel Better  1st Wednesday of the month  80SU-11 noon, Journey Room (Call [place] to register [phone])

## 2016-09-14 NOTE — Progress Notes (Signed)
[place] @ [facility] [phone]  Fax:([phone]) [place]  DANDRAE KUSTRA OB: 1935-06-09  MR#: [id number]  ZTI#:[id number]  Patient Care Team: [name], MD as PCP - General (Internal Medicine) Grace Isaac, MD as Consulting Physician (Cardiothoracic Surgery) Milus Height, MD as Consulting Physician (Internal Medicine) Satira Sark, MD as Consulting Physician (Cardiology)  CHIEF COMPLAINT:  Chief Complaint  Patient presents with  . New Patient (Initial Visit)    VISIT DIAGNOSIS:   Carcinoma of right upper lobe lung diagnosis in March of 2016.  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 Multinodular goiter status post thyroid biopsy and ultrasound in May of 2017.  Biopsy was inconclusive and no significant amount of cells obtained.  Further follow-up was recommended with ultrasound    No history exists.    Oncology Flowsheet 01/26/2015 01/27/2015 01/28/2015 01/29/2015 01/30/2015 01/31/2015  enoxaparin (LOVENOX) [place] - 40 mg 40 mg 40 mg 40 mg 40 mg  ondansetron (ZOFRAN) IJ 4 mg - - - - -    INTERVAL HISTORY: 81 year old gentleman today further follow-up regarding carcinoma of the lung and Hodgkin's disease follow-up.  Patient had a surveillance CT scan in May of 2017 which has been reported to be completely negative.  For any evidence of recurrent disease.  Recent also had multiple nodules detected on ultrasound of the thyroid gland a biopsy was attempted but no cells were found for evaluation Patient remains in a good performance status.  He slightly depressed because of loss of wife in April of 2017 No significant chest pain cough shortness of breath or hemoptysis.  No chills or fever.  REVIEW OF SYSTEMS:   GENERAL:  Feels good.  Active.  No fevers, sweats or weight loss. PERFORMANCE STATUS (ECOG):  0 HEENT:  No visual changes, runny  nose, sore throat, mouth sores or tenderness. Lungs: No shortness of breath or cough.  No hemoptysis. Cardiac:  No chest pain, palpitations, orthopnea, or PND. GI:  No nausea, vomiting, diarrhea, constipation, melena or hematochezia. GU:  No urgency, frequency, dysuria, or hematuria. Musculoskeletal:  No back pain.  No joint pain.  No muscle tenderness. Extremities:  No pain or swelling. Skin:  No rashes or skin changes. Neuro:  No headache, numbness or weakness, balance or coordination issues. Endocrine:  No diabetes, thyroid issues, hot flashes or night sweats. Psych:  No mood changes, depression or anxiety. Pain:  No focal pain. Review of systems:  All other systems reviewed and found to be negative.  As per HPI. Otherwise, a complete review of systems is negatve.  PAST MEDICAL HISTORY: Past Medical History:  Diagnosis Date  . Adenocarcinoma of lung [facility])    Resected March 2016  . Arthritis   . Atrial fibrillation ([place])    Possible history based on limited information  . Diverticulosis   . Essential hypertension, benign   . Hodgkin's lymphoma ([place])   . Mixed hyperlipidemia   . Swallowing difficulty   . Wolff-Parkinson-White (WPW) syndrome    Possible history based on limited information    PAST SURGICAL HISTORY: Past Surgical History:  Procedure Laterality Date  . NECK LESION BIOPSY    . SKIN CANCER EXCISION    . VIDEO ASSISTED THORACOSCOPY (VATS)/WEDGE RESECTION Right 01/26/2015   Procedure: RIGHT VIDEO ASSISTED THORACOSCOPY (VATS)WEDGE RESECTION OF RIGHT UPPER LOBE, LYMPH NODE DISSECTION;  Surgeon: Grace Isaac, MD;  Location: [facility]  OR;  Service: Thoracic;  Laterality: Right;  Marland Kitchen VIDEO BRONCHOSCOPY N/A 01/26/2015   Procedure: VIDEO BRONCHOSCOPY;  Surgeon: Grace Isaac, MD;  [facility] OR;  Service: Thoracic;  Laterality: N/A;    FAMILY HISTORY Family History  Problem Relation Age of Onset  . Diabetes Mother   . Lung cancer Father   . Diabetes Brother          ADVANCED DIRECTIVES:  Patient does not have any living will or healthcare power of attorney.  Information was given .  Available resources had been discussed.  We will follow-up on subsequent appointments regarding this issue  HEALTH MAINTENANCE: Social History  Substance Use Topics  . Smoking status: Former Smoker    Packs/day: 0.50    Years: 40.00    Types: Cigarettes    Quit date: 11/07/1979  . Smokeless tobacco: Never Used  . Alcohol use No       No Known Allergies  Current Outpatient Prescriptions  Medication Sig Dispense Refill  . aspirin EC 81 MG tablet Take 81 mg by mouth daily.      . digoxin (LANOXIN) 0.125 MG tablet Take 0.125 mg by mouth daily.    Marland Kitchen gemfibrozil (LOPID) 600 MG tablet Take 600 mg by mouth 2 (two) times daily before a meal.      . isosorbide mononitrate (IMDUR) 30 MG 24 hr tablet TAKE 1 TABLET BY MOUTH EVERY DAY 30 tablet 6  . lisinopril (PRINIVIL,ZESTRIL) 20 MG tablet Take one tablet by mouth every morning & 1/2 tablet every evening    . Multiple Vitamins-Minerals (CENTRUM SILVER PO) Take 1 tablet by mouth daily.      . propranolol (INDERAL) 80 MG tablet Take 80 mg by mouth 2 (two) times daily.      Marland Kitchen HYDROcodone-acetaminophen (VICODIN) 5-500 MG per tablet Take 1 tablet by mouth every 4 (four) hours as needed for pain. (Patient not taking: Reported on 09/14/2016) 50 tablet 0   No current facility-administered medications for this visit.     OBJECTIVE: PHYSICAL EXAM: GENERAL:  Well developed, well nourished, sitting comfortably in the exam room in no acute distress. MENTAL STATUS:  Alert and oriented to person, place and time. HEAD:  .  Normocephalic, atraumatic, face symmetric, no Cushingoid features. EYES:    Pupils equal round and reactive to light and accomodation.  No conjunctivitis or scleral icterus. ENT:  Oropharynx clear without lesion.  Tongue normal. Mucous membranes moist.  Examination of the thyroid gland: Slightly enlarged  thyroid gland but no palpable masses RESPIRATORY:  Clear to auscultation without rales, wheezes or rhonchi. CARDIOVASCULAR:  Regular rate and rhythm without murmur, rub or gallop.  ABDOMEN:  Soft, non-tender, with active bowel sounds, and no hepatosplenomegaly.  No masses. BACK:  No CVA tenderness.  No tenderness on percussion of the back or rib cage. SKIN:  No rashes, ulcers or lesions. EXTREMITIES: No edema, no skin discoloration or tenderness.  No palpable cords. LYMPH NODES: No palpable cervical, supraclavicular, axillary or inguinal adenopathy  NEUROLOGICAL: Unremarkable. PSYCH:  Appropriate.  Vitals:   09/14/16 1249  BP: 134/66  Pulse: (!) 57  Resp: 18  Temp: 98.5 F (36.9 C)     Body mass index is 37.62 kg/m.    ECOG FS:0 - Asymptomatic  LAB RESULTS:  No visits with results within 5 Day(s) from this visit.  Latest known visit with results is:  Hospital Outpatient Visit on 09/07/2015  Component Date Value Ref Range Status  . Glucose-Capillary 09/07/2015 89  65 - 99 mg/dL Final   javascript:;  STUDIES: Available lab data from primary care physician's office of  9/ 2017 has been reviewed and they are reported to be within normal limits.   Ultrasound-guided biopsy of the thyroid nodule was attempted.  Report has been reviewed.  Biopsy was inconclusive as there were no cells.  Physician note describes that nodules were not visualized on ultrasound.   CT scan of the chest dated May 2017.  Has been reviewed and no abnormality was detected.  CT scan report has been reviewed.  Actual films not available for me to review at present time.   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 3.  Multinodular goiter  needs follow-up with ultrasound of thyroid gland Lab data as planned by primary care physician next month  PLAN:  1.CTscan of chest with contrast for surveillance of  lung cancer Patient is a nonsmoker and quit smoking 35 years ago Lungs - Normal respiratory effort, chest expands symmetrically. Lungs are clear to auscultation, no crackles or wheezes.  Patient expressed understanding and was in agreement with this plan. He also understands that He can call clinic at any time with any questions, concerns, or complaints.  Surveillance (NCCN guidelines )  chest CT scan with and without contrast every 6-12 months postoperatively for 2 years. In low dose noncontrast enhanced chest CT scan is recommended annually thereafter.  Patient treated with radiation chemotherapy who has residual abnormality may require more frequent PET scan and CT scan.  MRI of brain is not recommended without symptoms PET scan may be helpful for assessing CT scan that appeared to show malignant neoplasm but may be radiation fibrosis or atelectasis.   Lung cancer, right upper lobe   Staging form: Lung, AJCC 7th Edition   - Pathologic stage from 02/01/2015: Stage IB (T2a, N0, cM0) - Signed by Grace Isaac, MD on 02/03/2015   - Clinical: Stage IB (T2a, N0, M0) - Unsigned   - Pathologic: No stage assigned - Marni Griffon, MD   09/14/2016 1:17 PM

## 2016-09-21 ENCOUNTER — Ambulatory Visit (HOSPITAL_COMMUNITY)
Admission: RE | Admit: 2016-09-21 | Discharge: 2016-09-21 | Disposition: A | Discharge status: Home / Self Care | Payer: Medicare Other | Source: Ambulatory Visit | Attending: Oncology | Admitting: Oncology

## 2016-09-21 DIAGNOSIS — D3502 Benign neoplasm of left adrenal gland: Secondary | ICD-10-CM | POA: Insufficient documentation

## 2016-09-21 DIAGNOSIS — Z9889 Other specified postprocedural states: Secondary | ICD-10-CM | POA: Diagnosis not present

## 2016-09-21 DIAGNOSIS — E042 Nontoxic multinodular goiter: Secondary | ICD-10-CM | POA: Insufficient documentation

## 2016-09-21 DIAGNOSIS — I700 Atherosclerosis of aorta: Secondary | ICD-10-CM | POA: Insufficient documentation

## 2016-09-21 DIAGNOSIS — C3411 Malignant neoplasm of upper lobe, right bronchus or lung: Secondary | ICD-10-CM | POA: Diagnosis present

## 2016-09-21 DIAGNOSIS — K5730 Diverticulosis of large intestine without perforation or abscess without bleeding: Secondary | ICD-10-CM | POA: Insufficient documentation

## 2016-09-21 DIAGNOSIS — I2510 Atherosclerotic heart disease of native coronary artery without angina pectoris: Secondary | ICD-10-CM | POA: Diagnosis not present

## 2016-09-21 MED ORDER — IOPAMIDOL (ISOVUE-300) INJECTION 61%
75.0000 mL | Freq: Once | INTRAVENOUS | Status: AC | PRN
  Administered 2016-09-21: 75 mL via INTRAVENOUS

## 2016-09-22 ENCOUNTER — Other Ambulatory Visit (HOSPITAL_COMMUNITY): Payer: Self-pay | Admitting: Hematology & Oncology

## 2016-09-22 DIAGNOSIS — R911 Solitary pulmonary nodule: Secondary | ICD-10-CM

## 2016-09-22 DIAGNOSIS — C3411 Malignant neoplasm of upper lobe, right bronchus or lung: Secondary | ICD-10-CM

## 2016-10-12 ENCOUNTER — Other Ambulatory Visit: Payer: Self-pay | Admitting: Cardiology

## 2017-02-02 ENCOUNTER — Ambulatory Visit (INDEPENDENT_AMBULATORY_CARE_PROVIDER_SITE_OTHER): Payer: Medicare Other | Admitting: Cardiology

## 2017-02-02 ENCOUNTER — Encounter: Payer: Self-pay | Admitting: Cardiology

## 2017-02-02 VITALS — BP 136/79 | HR 56 | Ht 71.0 in | Wt 265.0 lb

## 2017-02-02 DIAGNOSIS — I10 Essential (primary) hypertension: Secondary | ICD-10-CM

## 2017-02-02 DIAGNOSIS — I77810 Thoracic aortic ectasia: Secondary | ICD-10-CM | POA: Diagnosis not present

## 2017-02-02 DIAGNOSIS — I456 Pre-excitation syndrome: Secondary | ICD-10-CM

## 2017-02-02 DIAGNOSIS — R011 Cardiac murmur, unspecified: Secondary | ICD-10-CM

## 2017-02-02 NOTE — Progress Notes (Signed)
Cardiology Office Note  Date: 02/02/2017   ID: Dakota Shannon, DOB 06-03-1935, MRN [id number]  PCP: [name], MD  Primary Cardiologist: Rozann Lesches, MD   Chief Complaint  Patient presents with  . Wolff-Parkinson-White Syndrome    History of Present Illness: Dakota Shannon is a 82 y.o. male last seen in September 2017. He reports no palpitations or syncope. During the colder months he has had some tightness in his chest when he walks outside, but this is specifically in colder weather. Mild feeling of wheezing when this happens. Over the last few days he has been more active outside without any symptoms in the warmer weather. He is on Imdur for possible microvascular angina.  I personally reviewed his ECG from today which showed sinus bradycardia with low voltage. His last echocardiogram was in 2016. We discussed obtaining a follow-up study to reassess aortic valve and degree of aortic root dilatation.  Current cardiac regimen includes aspirin, Lanoxin, Imdur, Inderal and lisinopril.  Past Medical History:  Diagnosis Date  . Adenocarcinoma of lung [facility])    Resected March 2016  . Arthritis   . Atrial fibrillation ([place])    Possible history based on limited information  . Diverticulosis   . Essential hypertension, benign   . Hodgkin's lymphoma ([place])   . Mixed hyperlipidemia   . Swallowing difficulty   . Wolff-Parkinson-White (WPW) syndrome    Possible history based on limited information    Past Surgical History:  Procedure Laterality Date  . NECK LESION BIOPSY    . SKIN CANCER EXCISION    . VIDEO ASSISTED THORACOSCOPY (VATS)/WEDGE RESECTION Right 01/26/2015   Procedure: RIGHT VIDEO ASSISTED THORACOSCOPY (VATS)WEDGE RESECTION OF RIGHT UPPER LOBE, LYMPH NODE DISSECTION;  Surgeon: Grace Isaac, MD;  Location: [place];  Service: Thoracic;  Laterality: Right;  Marland Kitchen VIDEO BRONCHOSCOPY N/A 01/26/2015   Procedure: VIDEO BRONCHOSCOPY;  Surgeon: Grace Isaac, MD;   Location: [facility] OR;  Service: Thoracic;  Laterality: N/A;    Current Outpatient Prescriptions  Medication Sig Dispense Refill  . aspirin EC 81 MG tablet Take 81 mg by mouth daily.      . digoxin (LANOXIN) 0.125 MG tablet Take 0.125 mg by mouth daily.    Marland Kitchen gemfibrozil (LOPID) 600 MG tablet Take 600 mg by mouth 2 (two) times daily before a meal.      . HYDROcodone-acetaminophen (VICODIN) 5-500 MG per tablet Take 1 tablet by mouth every 4 (four) hours as needed for pain. 50 tablet 0  . isosorbide mononitrate (IMDUR) 30 MG 24 hr tablet TAKE 1 TABLET BY MOUTH EVERY DAY 30 tablet 6  . lisinopril (PRINIVIL,ZESTRIL) 20 MG tablet Take one tablet by mouth every morning & 1/2 tablet every evening    . Multiple Vitamins-Minerals (CENTRUM SILVER PO) Take 1 tablet by mouth daily.      . propranolol (INDERAL) 80 MG tablet Take 80 mg by mouth 2 (two) times daily.       No current facility-administered medications for this visit.    Allergies:  Patient has no known allergies.   Social History: The patient  reports that he quit smoking about 37 years ago. His smoking use included Cigarettes. He started smoking about 69 years ago. He has a 20.00 pack-year smoking history. He has never used smokeless tobacco. He reports that he does not drink alcohol or use drugs.   ROS:  Please see the history of present illness. Otherwise, complete review of systems is positive for  hearing loss.  All other systems are reviewed and negative.   Physical Exam: VS:  BP 136/79   Pulse (!) 56   Ht '5\' 11"'$  (1.803 m)   Wt 265 lb (120.2 kg)   SpO2 97% Comment: on RA  BMI 36.96 kg/m , BMI Body mass index is 36.96 kg/m.  Wt Readings from Last 3 Encounters:  02/02/17 265 lb (120.2 kg)  09/14/16 269 lb 11.2 oz (122.3 kg)  08/01/16 272 lb (123.4 kg)    General: Obese male, appears comfortable at rest. HEENT: Conjunctiva and lids normal, oropharynx clear with moist mucosa. Neck: Supple, no elevated JVP or carotid bruits. Lungs:  Clear to auscultation, nonlabored breathing at rest. Cardiac: Regular rate and rhythm, no S3 2/6 systolic murmur, no pericardial rub. Abdomen: Soft, nontender, no hepatomegaly, bowel sounds present, no guarding or rebound. Extremities: No pitting edema, distal pulses 2+. Skin: Warm and dry. Musculoskeletal: No kyphosis. Neuropsychiatric: Alert and oriented 3, affect appropriate.  ECG: I personally reviewed the tracing from 02/01/2016 which showed sinus rhythm with leftward axis and nonspecific T-wave changes.  Recent Labwork: 09/14/2016: ALT 17; AST 20; BUN 22; Creatinine, Ser 1.18; Potassium 4.4; Sodium 136   Other Studies Reviewed Today:  Echocardiogram 12/03/2014: Study Conclusions  - Procedure narrative: Transthoracic echocardiography for left ventricular function evaluation, for right ventricular function evaluation, and for assessment of valvular function. Image quality was suboptimal, with poor endocardial and valvular visualization. - Left ventricle: The cavity size was normal. There was mild concentric hypertrophy. Systolic function was normal. The estimated ejection fraction was in the range of 55% to 60%. Images were inadequate for LV wall motion assessment. Doppler parameters are consistent with abnormal left ventricular relaxation (grade 1 diastolic dysfunction). - Aortic valve: Poorly visualized. Mildly calcified annulus. - Aorta: Mild aortic root dilatation. Aortic root dimension: 40 mm (ED). - Mitral valve: There was trivial regurgitation. - Left atrium: The atrium was mildly dilated. Volume/bsa, S: 29.7 ml/m^2.  Assessment and Plan:  1. Asymptomatic Wolff-Parkinson-White syndrome. ECG reviewed. Continue observation on current medical therapy.  2. History of aortic valve sclerosis and mildly dilated aortic root. Heart murmur somewhat more prominent. Follow-up echocardiogram will be obtained in comparison to 2016.  3. Essential  hypertension, blood pressure is adequately controlled today.  4. Mixed hyperlipidemia, continues on Lopid with follow-up per Dr. Woody Seller.  Current medicines were reviewed with the patient today.   Orders Placed This Encounter  Procedures  . EKG 12-Lead  . ECHOCARDIOGRAM COMPLETE    Disposition: Follow-up in 6 months.  Signed, Satira Sark, MD, [facility] 02/02/2017 4:36 PM    [facility] [place], [place], [place] [address] Phone: [phone]; Fax: [phone]

## 2017-02-02 NOTE — Patient Instructions (Signed)

## 2017-02-06 ENCOUNTER — Telehealth: Payer: Self-pay

## 2017-02-06 NOTE — Telephone Encounter (Signed)
TELEPHONE CALL IN ERROR

## 2017-02-14 ENCOUNTER — Ambulatory Visit (INDEPENDENT_AMBULATORY_CARE_PROVIDER_SITE_OTHER): Payer: Medicare Other

## 2017-02-14 ENCOUNTER — Other Ambulatory Visit: Payer: Self-pay

## 2017-02-14 DIAGNOSIS — I456 Pre-excitation syndrome: Secondary | ICD-10-CM

## 2017-02-14 LAB — ECHOCARDIOGRAM COMPLETE
CHL CUP MV DEC (S): 312
CHL CUP TV REG PEAK VELOCITY: 278 cm/s
E decel time: 312 msec
E/e' ratio: 6.42
FS: 54 % — AB (ref 28–44)
IVS/LV PW RATIO, ED: 1.23
LA ID, A-P, ES: 41 mm
LA diam end sys: 41 mm
LA diam index: 1.64 cm/m2
LA vol index: 23.4 mL/m2
LAVOL: 58.6 mL
LAVOLA4C: 58.5 mL
LDCA: 3.46 cm2
LV E/e' medial: 6.42
LV TDI E'LATERAL: 8.61
LV TDI E'MEDIAL: 8.03
LV dias vol index: 16 mL/m2
LV dias vol: 40 mL — AB (ref 62–150)
LV e' LATERAL: 8.61 cm/s
LVEEAVG: 6.42
LVOT diameter: 21 mm
LVSYSVOL: 10 mL — AB (ref 21–61)
LVSYSVOLIN: 4 mL/m2
Lateral S' vel: 22.2 cm/s
MV pk E vel: 55.3 m/s
MVPKAVEL: 84.9 m/s
PW: 8.46 mm — AB (ref 0.6–1.1)
RV TAPSE: 24.3 mm
RV sys press: 34 mmHg
Simpson's disk: 75
Stroke v: 30 ml
TR max vel: 278 cm/s

## 2017-02-15 ENCOUNTER — Telehealth: Payer: Self-pay

## 2017-02-15 NOTE — Telephone Encounter (Signed)
Patient notified. Routed to PCP 

## 2017-02-15 NOTE — Telephone Encounter (Signed)
-----   Message from Satira Sark, MD sent at 02/15/2017  9:29 AM EDT ----- Results reviewed. Overall stable findings with LVEF 65-70% and sclerotic aortic valve without stenosis. Continue with same medications and follow-up plan. A copy of this test should be forwarded to [name], MD.

## 2017-03-12 ENCOUNTER — Ambulatory Visit (HOSPITAL_COMMUNITY): Payer: Medicare Other

## 2017-03-12 ENCOUNTER — Other Ambulatory Visit (HOSPITAL_COMMUNITY): Payer: Medicare Other

## 2017-03-16 ENCOUNTER — Ambulatory Visit (HOSPITAL_COMMUNITY): Payer: Medicare Other

## 2017-03-16 ENCOUNTER — Other Ambulatory Visit (HOSPITAL_COMMUNITY): Payer: Medicare Other

## 2017-04-20 ENCOUNTER — Other Ambulatory Visit (HOSPITAL_COMMUNITY): Payer: Self-pay | Admitting: Oncology

## 2017-04-20 DIAGNOSIS — C3411 Malignant neoplasm of upper lobe, right bronchus or lung: Secondary | ICD-10-CM

## 2017-04-23 ENCOUNTER — Telehealth: Payer: Self-pay

## 2017-04-23 NOTE — Telephone Encounter (Signed)
Only providers who perform EBUS are JN, PM, and RB.    JN's next available for consult is 7/24. PM's next available for consult is 9/4. RB's next available for consult is 8/31.  Providers please advise if pt can be worked in to schedule, as Dr. Tasia Catchings is requesting an asap consult for an ebus.  Thanks.

## 2017-04-23 NOTE — Telephone Encounter (Signed)
Dr. Georgette Dover is requesting sooner consult then 06/04/17. Pt has dx of lung CA.  VS you have a 4:30 slot on 04/25/17 15 minute slot. I have placed a temporary hold on this slot just in case you would be willing to use.  VS please advise. Thanks.

## 2017-04-23 NOTE — Telephone Encounter (Signed)
Can you please find out why consult visit is needed urgently first.

## 2017-04-23 NOTE — Telephone Encounter (Signed)
Called back and spoke with Lelon Frohlich at Dr. Collie Siad office, states that Dr. Collie Siad OV note from Friday is incomplete but that the referral is for an Ebus to be performed.  I advised that while we are happy to see pt, he is already established with oncology for his lung cancer so we are trying to determine how urgent this truly is.  Lelon Frohlich stated that she did not have any additional info for me, just that the referral was placed by Dr. Tasia Catchings and his nurse as urgent for an ebus to be performed.    Please note pt is having a restaging PET on 6/25.    VS please advise.  Thanks!

## 2017-04-23 NOTE — Telephone Encounter (Signed)
Received call from Dr Tasia Catchings This pt needs earlier appt Pl work him in with other provider who performs EBUS. I do not have any openings but can see him if any cancellations

## 2017-04-23 NOTE — Telephone Encounter (Addendum)
BQ or PM please advise if you would be willing to work this pt into your schedule.  He will be needing an EBUS--having a restaging PET on 6/25 and Dr. Tasia Catchings wanted him to be seen soon after this PET scan.  Please advise. Thanks

## 2017-04-23 NOTE — Telephone Encounter (Signed)
He needs to have appointment schedule after PET scan on 04/30/17 since PET scan will help with planning of EBUS.  Also, I don't perform EBUS >> would be best to schedule with provider who does this test.

## 2017-04-24 NOTE — Telephone Encounter (Signed)
Spoke with Lelon Frohlich with Dr. Collie Siad office. Advised her that the patient can be seen on 6/29 at 54 with Dr. Ashok Cordia. Advised her that he will need to arrive 15 early for check-in and paperwork. She stated that she will contact the patient with the appointment and that his consult papers were sent over yesterday while on the phone with [facility].   Nothing further needed. Appt. Has been made.

## 2017-04-24 NOTE — Telephone Encounter (Signed)
You can open a slot & tack him on for 6/29 @ 8:30am.

## 2017-04-26 ENCOUNTER — Other Ambulatory Visit (INDEPENDENT_AMBULATORY_CARE_PROVIDER_SITE_OTHER): Payer: Self-pay | Admitting: Otolaryngology

## 2017-04-26 ENCOUNTER — Ambulatory Visit (INDEPENDENT_AMBULATORY_CARE_PROVIDER_SITE_OTHER): Payer: Medicare Other | Admitting: Otolaryngology

## 2017-04-26 DIAGNOSIS — R1310 Dysphagia, unspecified: Secondary | ICD-10-CM

## 2017-04-26 DIAGNOSIS — R1312 Dysphagia, oropharyngeal phase: Secondary | ICD-10-CM

## 2017-04-30 ENCOUNTER — Ambulatory Visit (HOSPITAL_COMMUNITY)
Admission: RE | Admit: 2017-04-30 | Discharge: 2017-04-30 | Disposition: A | Discharge status: Home / Self Care | Payer: Medicare Other | Source: Ambulatory Visit | Attending: Oncology | Admitting: Oncology

## 2017-04-30 DIAGNOSIS — R590 Localized enlarged lymph nodes: Secondary | ICD-10-CM | POA: Diagnosis not present

## 2017-04-30 DIAGNOSIS — C3490 Malignant neoplasm of unspecified part of unspecified bronchus or lung: Secondary | ICD-10-CM | POA: Diagnosis not present

## 2017-04-30 DIAGNOSIS — K5730 Diverticulosis of large intestine without perforation or abscess without bleeding: Secondary | ICD-10-CM | POA: Diagnosis not present

## 2017-04-30 DIAGNOSIS — N400 Enlarged prostate without lower urinary tract symptoms: Secondary | ICD-10-CM | POA: Diagnosis not present

## 2017-04-30 DIAGNOSIS — N281 Cyst of kidney, acquired: Secondary | ICD-10-CM | POA: Insufficient documentation

## 2017-04-30 DIAGNOSIS — I2510 Atherosclerotic heart disease of native coronary artery without angina pectoris: Secondary | ICD-10-CM | POA: Insufficient documentation

## 2017-04-30 DIAGNOSIS — C3411 Malignant neoplasm of upper lobe, right bronchus or lung: Secondary | ICD-10-CM

## 2017-04-30 DIAGNOSIS — I700 Atherosclerosis of aorta: Secondary | ICD-10-CM | POA: Diagnosis not present

## 2017-04-30 DIAGNOSIS — K8020 Calculus of gallbladder without cholecystitis without obstruction: Secondary | ICD-10-CM | POA: Insufficient documentation

## 2017-04-30 LAB — GLUCOSE, CAPILLARY: Glucose-Capillary: 93 mg/dL (ref 65–99)

## 2017-04-30 MED ORDER — FLUDEOXYGLUCOSE F - 18 (FDG) INJECTION
14.7000 | Freq: Once | INTRAVENOUS | Status: AC | PRN
  Administered 2017-04-30: 14.7 via INTRAVENOUS

## 2017-05-01 ENCOUNTER — Ambulatory Visit (HOSPITAL_COMMUNITY)
Admission: RE | Admit: 2017-05-01 | Discharge: 2017-05-01 | Disposition: A | Discharge status: Home / Self Care | Payer: Medicare Other | Source: Ambulatory Visit | Attending: Otolaryngology | Admitting: Otolaryngology

## 2017-05-01 DIAGNOSIS — K222 Esophageal obstruction: Secondary | ICD-10-CM | POA: Insufficient documentation

## 2017-05-01 DIAGNOSIS — R1310 Dysphagia, unspecified: Secondary | ICD-10-CM | POA: Insufficient documentation

## 2017-05-01 DIAGNOSIS — I700 Atherosclerosis of aorta: Secondary | ICD-10-CM | POA: Diagnosis not present

## 2017-05-03 ENCOUNTER — Other Ambulatory Visit: Payer: Self-pay | Admitting: Cardiology

## 2017-05-04 ENCOUNTER — Encounter: Payer: Self-pay | Admitting: Pulmonary Disease

## 2017-05-04 ENCOUNTER — Other Ambulatory Visit: Payer: Medicare Other

## 2017-05-04 ENCOUNTER — Ambulatory Visit (INDEPENDENT_AMBULATORY_CARE_PROVIDER_SITE_OTHER): Payer: Medicare Other | Admitting: Pulmonary Disease

## 2017-05-04 DIAGNOSIS — J449 Chronic obstructive pulmonary disease, unspecified: Secondary | ICD-10-CM | POA: Insufficient documentation

## 2017-05-04 DIAGNOSIS — R590 Localized enlarged lymph nodes: Secondary | ICD-10-CM

## 2017-05-04 NOTE — Progress Notes (Signed)
Subjective:    Patient ID: Dakota Shannon, male    DOB: [birth year], 82 y.o.   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 He had no adjuvant chemotherapy or radiation therapy. He was treated for Hodgkin's Lymphoma in May 2016. He was treated with XRT to his neck. He took no chemotherapy for his lymphoma per his report. He reports dyspnea on exertion over the last few months. He describes this as more of a "burning in his chest" with "reaching the point of exhaustion". He denies any dyspnea at rest. He denies any associated coughing or wheezing. He denies any history of treatment with inhaled medications. Denies any history of bronchitis or pneumonia. Denies any dyspnea, breathing, problems or asthma as a child. No other chest pain, pressure, or tightness. Denies any reflux, dyspepsia, or morning brash water taste. He reports mild sinus drainage constantly but worse at times, primarily in the early Spring. No other sinus congestion or pressure. No fever, chills, or sweats. He has lost 4 pounds in the last couple of weeks that he attributes to dietary restrictions. No adenopathy in his neck, groin, or axilla. He did have a colonoscopy in January without any mass.   Review of Systems No rashes but does bruise easily. No abdominal pain, nausea, or emesis. Denies any dysuria or hematuria. A pertinent 14 point review of systems is negative except as per the history of presenting illness.  No Known Allergies  Current Outpatient Prescriptions on File Prior to Visit  Medication Sig Dispense Refill  . aspirin EC 81 MG tablet Take 81 mg by mouth daily.      . digoxin (LANOXIN) 0.125 MG tablet Take 0.125 mg by mouth daily.    Marland Kitchen gemfibrozil (LOPID) 600 MG tablet Take 600 mg by mouth 2 (two) times daily before a meal.      . HYDROcodone-acetaminophen (VICODIN) 5-500 MG per tablet Take 1 tablet by mouth every  4 (four) hours as needed for pain. 50 tablet 0  . isosorbide mononitrate (IMDUR) 30 MG 24 hr tablet TAKE 1 TABLET BY MOUTH EVERY DAY 30 tablet 6  . lisinopril (PRINIVIL,ZESTRIL) 20 MG tablet Take one tablet by mouth every morning & 1/2 tablet every evening    . Multiple Vitamins-Minerals (CENTRUM SILVER PO) Take 1 tablet by mouth daily.      . propranolol (INDERAL) 80 MG tablet Take 80 mg by mouth 2 (two) times daily.       No current facility-administered medications on file prior to visit.     Past Medical History:  Diagnosis Date  . Adenocarcinoma of lung [facility])    Resected March 2016  . Arthritis   . Atrial fibrillation ([place])    Possible history based on limited information  . COPD (chronic obstructive pulmonary disease) ([place])   . Diverticulosis   . Essential hypertension, benign   . Hodgkin's lymphoma ([place])   . Mixed hyperlipidemia   . Swallowing difficulty   . Wolff-Parkinson-White (WPW) syndrome    Possible history based on limited information    Past Surgical History:  Procedure Laterality Date  . CATARACT EXTRACTION    . COLONOSCOPY    . NECK LESION BIOPSY    . SKIN CANCER EXCISION    . VIDEO ASSISTED THORACOSCOPY (VATS)/WEDGE RESECTION Right 01/26/2015   Procedure: RIGHT VIDEO ASSISTED THORACOSCOPY (VATS)WEDGE RESECTION OF RIGHT UPPER LOBE, LYMPH NODE DISSECTION;  Surgeon:  Grace Isaac, MD;  Location: [place];  Service: Thoracic;  Laterality: Right;  Marland Kitchen VIDEO BRONCHOSCOPY N/A 01/26/2015   Procedure: VIDEO BRONCHOSCOPY;  Surgeon: Grace Isaac, MD;  Location[facility] OR;  Service: Thoracic;  Laterality: N/A;    Family History  Problem Relation Age of Onset  . Diabetes Mother   . Lung cancer Father   . Diabetes Brother   . Lung cancer Brother   . Lung disease Neg Hx     Social History   Social History  . Marital status: Widowed    Spouse name: N/A  . Number of children: N/A  . Years of education: N/A   Occupational History  . Retired    Social History  Main Topics  . Smoking status: Former Smoker    Packs/day: 0.50    Years: 33.00    Types: Cigarettes    Start date: 02/27/1947    Quit date: 11/07/1979  . Smokeless tobacco: Never Used  . Alcohol use No  . Drug use: No  . Sexual activity: Not Asked   Other Topics Concern  . None   Social History Narrative   [facility] (05/04/17):   Originally from [place]. He served in [employer]. He was in [employer] in [place]. He was in [place], [place], & [place]. 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 Has a dog and cats at home. No bird exposure. No mold exposure. Questionable exposure to asbestos.       Objective:   Physical Exam BP (!) 144/80 (BP Location: Left Arm, Patient Position: Sitting, Cuff Size: Large)   Pulse (!) 57   Ht 5\' 11"  (1.803 m)   Wt 261 lb 3.2 oz (118.5 kg)   SpO2 97%   BMI 36.43 kg/m  General:  Awake. Alert. No acute distress. Obese. Accompanied by daughter today. Integument:  Warm & dry. No rash on exposed skin. No bruising on exposed skin. Extremities:  No cyanosis or clubbing.  Lymphatics:  No appreciated cervical or supraclavicular lymphadenoapthy. HEENT:  Moist mucus membranes. No oral ulcers. No scleral injection or icterus. Mild bilateral nasal turbinate swelling Cardiovascular:  Regular rhythm. No edema. Normal S1 & S2.  Pulmonary:  Good aeration & clear to auscultation bilaterally. Symmetric chest wall expansion. No accessory muscle use on room air. Abdomen: Soft. Normal bowel sounds. Protuberant. Grossly nontender. Musculoskeletal:  Normal bulk and tone. Hand grip strength 5/5 bilaterally. No joint deformity or effusion appreciated. Neurological:  CN 2-12 grossly in tact. No meningismus. Moving all 4 extremities equally. Symmetric brachioradialis deep tendon reflexes. Psychiatric:  Mood and affect congruent. Speech normal rhythm, rate & tone.   PFT 01/22/15: FVC 3.00 L  (82%) FEV1 1.76 L (68%) FEV1/FVC 0.59 FEF 25-75 0.89 L (49%) negative bronchodilator response DLCO uncorrected 132%  IMAGING ESOPHAGRAM/BARIUM SWALLOW 05/01/17 (per radiologist):  Severe diffuse impairment of esophageal motility. Stricture at the gastroesophageal junction which obstructed a 12.5 mm diameter barium tablet for several minutes ; tall column of contrast and water above the pill extended to the cervical esophagus, re-creating patient's symptoms. With additional swallows of heavy barium the pill eventually passed into the stomach. Aortic Atherosclerosis (ICD10-I70.0).  PET CT (personally reviewed by me):  Left adrenal mass measuring 2.5 cm at maximum dimension without obvious change in size. 6 mm subpleural nodule in the lingula adjacent major fissure without appreciable change in size or hypermetabolic activity. Calcified nodule left  upper lobe. Right paratracheal lymph node at station 4R measuring at most 1.4 cm in short axis with maximum SUV 4.4 & 8 mm right hilar lymph node with maximum SUV 4.7.  IMAGING TTE (02/14/17):  LV normal in size with EF 65-70%. Inadequate assessment [facility].. Grade 1 diastolic dysfunction. LA & RA normal in size. RV normal in size and function. Aortic valve poorly visualized but without stenosis or regurgitation. Aortic root mildly dilated. Trivial mitral regurgitation without stenosis. Pulmonic valve poorly visualized but without significant regurgitation or stenosis. Tricuspid valve poorly visualized with trivial regurgitation without evidence of stenosis. No pericardial effusion.  PATHOLOGY RUL LOBECTOMY (01/26/15):  Invasive adenocarcinoma moderately differentiated with maximum dimension 2.2 cm involving the visceral pleura. Margin negative for adenocarcinoma. Lymph nodes at station 4R, 10R, & 11 negative for malignancy.    Assessment & Plan:  82 y.o. male with history of Hodgkin's lymphoma as well as right upper lobe adenocarcinoma. Patient underwent  lobectomy and 2016. Recent PET/CT imaging demonstrates hypermetabolic precarinal lymphadenopathy. No other appreciable hypermetabolic activity is present on imaging. Review the patient's previous pulmonary function testing also shows moderate airway obstruction consistent with COPD. Patient's history of adenocarcinoma as well as lymphoma Is concerning for a recurrence of either his lymphoma or non-small cell lung cancer. I would favor lymphoma as I do not feel the nodule in his lingula has appreciably changed in size over the last 2 years. Certainly this could represent a slow growing malignancy. Given the potential for insufficient tissue sampling with fine-needle aspiration in the setting of lymphoma I believe mediastinoscopy would be the best procedure for the patient. We did discuss the risks of both procedures briefly today. I instructed the patient and his daughter to contact me if they have any further questions or concerns before his next appointment.  1. Hypermetabolic mediastinal adenopathy: Referring back to Dr. Servando Snare for mediastinoscopy given the risk of lymphoma and insufficient sampling with fine-needle aspiration. 2. Moderate COPD: Screening for alpha-1 antitrypsin deficiency. Checking full pulmonary function testing on her before next appointment. 3. Lingula nodule: Holding off on further imaging pending results of biopsy.  4. Follow-up: Return to clinic in 4 weeks or sooner if needed.  Sonia Baller Ashok Cordia, M.D. [facility] Pager:  [phone] After 3pm or if no response, call [phone] 9:03 AM 05/04/17

## 2017-05-04 NOTE — Patient Instructions (Signed)
   I am sending you to see Dr. Servando Snare again to talk about doing a procedure called a mediastinoscopy to biopsy the lymph node given your risk of lymphoma.  We will set you up for breathing tests on or before your next appointment to evaluate your COPD.  I'll defer to your primary care physician to refer you to a GI specialist to address the narrowing in your esophagus.  Call or e-mail me if you have any questions or concerns before your next appointment.  TESTS ORDERED: 1. Full PFTs on or before next appointment 2. Serum alpha-1 antitrypsin phenotype today

## 2017-05-09 LAB — ALPHA-1 ANTITRYPSIN PHENOTYPE: A-1 Antitrypsin: 150 mg/dL (ref 83–199)

## 2017-05-14 ENCOUNTER — Institutional Professional Consult (permissible substitution) (INDEPENDENT_AMBULATORY_CARE_PROVIDER_SITE_OTHER): Payer: Medicare Other | Admitting: Cardiothoracic Surgery

## 2017-05-14 ENCOUNTER — Other Ambulatory Visit: Payer: Self-pay | Admitting: *Deleted

## 2017-05-14 ENCOUNTER — Encounter: Payer: Self-pay | Admitting: Cardiothoracic Surgery

## 2017-05-14 VITALS — BP 147/83 | HR 50 | Resp 20 | Ht 71.0 in | Wt 261.0 lb

## 2017-05-14 DIAGNOSIS — R599 Enlarged lymph nodes, unspecified: Secondary | ICD-10-CM

## 2017-05-14 DIAGNOSIS — R591 Generalized enlarged lymph nodes: Secondary | ICD-10-CM

## 2017-05-14 DIAGNOSIS — R590 Localized enlarged lymph nodes: Secondary | ICD-10-CM

## 2017-05-14 DIAGNOSIS — C3411 Malignant neoplasm of upper lobe, right bronchus or lung: Secondary | ICD-10-CM | POA: Diagnosis not present

## 2017-05-14 DIAGNOSIS — Z8571 Personal history of Hodgkin lymphoma: Secondary | ICD-10-CM | POA: Diagnosis not present

## 2017-05-14 NOTE — Progress Notes (Signed)
[address]       [place] [address]             [phone]                    Donivan N Ashmead Ambler Medical Record #[id number] Date of Birth: 1935-03-23  Referring: Javier Glazier, MD Primary Care: [name], MD  Chief Complaint:    Chief Complaint  Patient presents with  . Adenopathy    Sugical eval, hypermetabolic precarinal lymphadenopathy noted on PET Scan 04/30/17  Cancer Staging Lung cancer, right upper lobe Staging form: Lung, AJCC 7th Edition - Pathologic stage from 02/01/2015: Stage IB (T2a, N0, cM0) - Signed by Grace Isaac, MD on 02/03/2015 - Clinical: Stage IB (T2a, N0, M0) - Unsigned - Pathologic: No stage assigned - Unsigned    History of Present Illness:    Dakota Shannon 82 y.o. male is seen in the office  today for evaluation of mediastinal adenopathy. In 2016 patient had dx of hodgkin's in nodes in the right neck and adenocarcinoma of the right upper lobe . He under went wedge resection and nodes sampling.He was treated for Hodgkin's Lymphoma in May 2016. He was treated with XRT to his neck He has had increasing difficulty swallowing .     Current Activity/ Functional Status:  Patient is independent with mobility/ambulation, transfers, ADL's, IADL's.   Zubrod Score: At the time of surgery this patient's most appropriate activity status/level should be described as: []     0    Normal activity, no symptoms [x]     1    Restricted in physical strenuous activity but ambulatory, able to do out light work []     2    Ambulatory and capable of self care, unable to do work activities, up and about               >50 % of waking hours                              []     3    Only limited self care, in bed greater than 50% of waking hours []     4    Completely disabled, no self care, confined to bed or chair []     5    Moribund   Past Medical History:  Diagnosis Date  . Adenocarcinoma of lung [facility])    Resected March  2016  . Arthritis   . Atrial fibrillation ([place])    Possible history based on limited information  . COPD (chronic obstructive pulmonary disease) ([place])   . Diverticulosis   . Essential hypertension, benign   . Hodgkin's lymphoma ([place])   . Mixed hyperlipidemia   . Swallowing difficulty   . Wolff-Parkinson-White (WPW) syndrome    Possible history based on limited information    Past Surgical History:  Procedure Laterality Date  . CATARACT EXTRACTION    . COLONOSCOPY    . NECK LESION BIOPSY    . SKIN CANCER EXCISION    . VIDEO ASSISTED THORACOSCOPY (VATS)/WEDGE RESECTION Right 01/26/2015   Procedure: RIGHT VIDEO ASSISTED THORACOSCOPY (VATS)WEDGE RESECTION OF RIGHT UPPER LOBE, LYMPH NODE DISSECTION;  Surgeon: Grace Isaac, MD;  Location: [facility];  Service: Thoracic;  Laterality: Right;  Marland Kitchen VIDEO BRONCHOSCOPY N/A 01/26/2015   Procedure: VIDEO BRONCHOSCOPY;  Surgeon: Grace Isaac, MD;  Location: [facility]  OR;  Service: Thoracic;  Laterality: N/A;    Family History  Problem Relation Age of Onset  . Diabetes Mother   . Lung cancer Father   . Diabetes Brother   . Lung cancer Brother   . Lung disease Neg Hx     Social History   Social History  . Marital status: Widowed    Spouse name: N/A  . Number of children: N/A  . Years of education: N/A   Occupational History  . Retired    Social History Main Topics  . Smoking status: Former Smoker    Packs/day: 0.50    Years: 33.00    Types: Cigarettes    Start date: 02/27/1947    Quit date: 11/07/1979  . Smokeless tobacco: Never Used  . Alcohol use No  . Drug use: No  . Sexual activity: Not on file   Other Topics Concern  . Not on file   Social History Narrative   [facility] (05/04/17):   Originally from [place]. He served in [employer]. He was in [employer] in [place]. He was in [place], [place], & [place]. 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 Then he retired from  [employer]. Has a dog and cats at home. No bird exposure. No mold exposure. Questionable exposure to asbestos.     History  Smoking Status  . Former Smoker  . Packs/day: 0.50  . Years: 33.00  . Types: Cigarettes  . Start date: 02/27/1947  . Quit date: 11/07/1979  Smokeless Tobacco  . Never Used    History  Alcohol Use No     No Known Allergies  Current Outpatient Prescriptions  Medication Sig Dispense Refill  . aspirin EC 81 MG tablet Take 81 mg by mouth daily.      . digoxin (LANOXIN) 0.125 MG tablet Take 0.125 mg by mouth daily.    Marland Kitchen gemfibrozil (LOPID) 600 MG tablet Take 600 mg by mouth 2 (two) times daily before a meal.      . HYDROcodone-acetaminophen (VICODIN) 5-500 MG per tablet Take 1 tablet by mouth every 4 (four) hours as needed for pain. 50 tablet 0  . isosorbide mononitrate (IMDUR) 30 MG 24 hr tablet TAKE 1 TABLET BY MOUTH EVERY DAY 30 tablet 6  . lisinopril (PRINIVIL,ZESTRIL) 20 MG tablet Take one tablet by mouth every morning & 1/2 tablet every evening    . Multiple Vitamins-Minerals (CENTRUM SILVER PO) Take 1 tablet by mouth daily.      . propranolol (INDERAL) 80 MG tablet Take 80 mg by mouth 2 (two) times daily.       No current facility-administered medications for this visit.     Pertinent items are noted in HPI.   Review of Systems:     Cardiac Review of Systems: Y or N  Chest Pain [    ]  Resting SOB [   ] Exertional SOB  [  ]  Orthopnea [  ]   Pedal Edema [   ]    Palpitations [  ] Syncope  [  ]   Presyncope [   ]  General Review of Systems: [Y] = yes [  ]=no Constitional: recent weight change [  ];  Wt loss over the last 3 months [   ] anorexia [  ]; fatigue [  ]; nausea [  ]; night sweats [  ]; fever [  ]; or chills [  ];  Dental: poor dentition[  ]; Last Dentist visit:   Eye : blurred vision [  ]; diplopia [   ]; vision changes [  ];  Amaurosis fugax[  ]; Resp: cough [  ];  wheezing[  ];  hemoptysis[  ]; shortness of breath[  ]; paroxysmal  nocturnal dyspnea[  ]; dyspnea on exertion[  ]; or orthopnea[  ];  GI:  gallstones[  ], vomiting[  ];  dysphagia[  ]; melena[  ];  hematochezia [  ]; heartburn[  ];   Hx of  Colonoscopy[  ]; GU: kidney stones [  ]; hematuria[  ];   dysuria [  ];  nocturia[  ];  history of     obstruction [  ]; urinary frequency [  ]             Skin: rash, swelling[  ];, hair loss[  ];  peripheral edema[  ];  or itching[  ]; Musculosketetal: myalgias[  ];  joint swelling[  ];  joint erythema[  ];  joint pain[  ];  back pain[  ];  Heme/Lymph: bruising[  ];  bleeding[  ];  anemia[  ];  Neuro: TIA[  ];  headaches[  ];  stroke[  ];  vertigo[  ];  seizures[  ];   paresthesias[  ];  difficulty walking[  ];  Psych:depression[  ]; anxiety[  ];  Endocrine: diabetes[  ];  thyroid dysfunction[  ];  Immunizations: Flu up to date [  ]; Pneumococcal up to date [  ];  Other:  Physical Exam: BP (!) 147/83 (BP Location: Right Arm, Cuff Size: Large)   Pulse (!) 50   Resp 20   Ht 5\' 11"  (1.803 m)   Wt 261 lb (118.4 kg)   SpO2 96% Comment: RA  BMI 36.40 kg/m   PHYSICAL EXAMINATION: General appearance: alert, cooperative and appears older than stated age Head: Normocephalic, without obvious abnormality, atraumatic Neck: no adenopathy, no carotid bruit, no JVD, supple, symmetrical, trachea midline and thyroid not enlarged, symmetric, no tenderness/mass/nodules Lymph nodes: Cervical, supraclavicular, and axillary nodes normal. Resp: diminished breath sounds bibasilar Back: symmetric, no curvature. ROM normal. No CVA tenderness. Cardio: regular rate and rhythm, S1, S2 normal, no murmur, click, rub or gallop GI: soft, non-tender; bowel sounds normal; no masses,  no organomegaly Extremities: extremities normal, atraumatic, no cyanosis or edema and Homans sign is negative, no sign of DVT Neurologic: Grossly normal  Diagnostic Studies & Laboratory data:     Recent Radiology Findings:   Dg Esophagus  Result Date:  05/01/2017 CLINICAL DATA:  Cervical dysphagia minute for solids, symptoms for years but worsening ; past history of atrial fibrillation, Hodgkin's lymphoma, lung cancer post resection and radiation therapy EXAM: ESOPHOGRAM / BARIUM SWALLOW / BARIUM TABLET STUDY TECHNIQUE: Combined double contrast and single contrast examination performed using effervescent crystals, thick barium liquid, and thin barium liquid. The patient was observed with fluoroscopy swallowing a 13 mm barium sulphate tablet. FLUOROSCOPY TIME:  Fluoroscopy Time:  4 minutes 6 seconds Radiation Exposure Index (if provided by the fluoroscopic device): 133.9 mGy Number of Acquired Spot Images: multiple fluoroscopic screen captures COMPARISON:  None. FINDINGS: Esophageal distention: Narrowing at the gastroesophageal junction. Remainder of esophagus distends normally. Filling defects:  None 12.5 mm barium tablet: Obstructed at the gastroesophageal junction. Could not pass into the stomach despite multiple swallows of water. Contrast and water column extended proximally within the esophagus to the level of the inferior cervical esophagus. This re-created the patient's  symptoms. After several additional swallows of thick barium the pill eventually crossed into the stomach. Motility: Severe diffuse esophageal motility with incomplete clearance of barium by primary peristaltic waves. Weak inefficient secondary waves and numerous tertiary waves were identified as well as significant retrograde peristalsis. Mucosa:  Smooth without irregularity or ulceration Hypopharynx/cervical esophagus: No laryngeal penetration or aspiration. No residuals. Hiatal hernia:  Absent GE reflux:  Not witnessed during exam Other:  Atherosclerotic calcification aorta. IMPRESSION: Severe diffuse impairment of esophageal motility. Stricture at the gastroesophageal junction which obstructed a 12.5 mm diameter barium tablet for several minutes ; tall column of contrast and water above  the pill extended to the cervical esophagus, re-creating patient's symptoms. With additional swallows of heavy barium the pill eventually passed into the stomach. Aortic Atherosclerosis (ICD10-I70.0). Electronically Signed   By: Lavonia Dana M.D.   On: 05/01/2017 10:02   Nm Pet Image Restag (ps) Skull Base To Thigh  Result Date: 04/30/2017 CLINICAL DATA:  Subsequent treatment strategy for right upper lobe adenocarcinoma. History of Hodgkin's lymphoma. EXAM: NUCLEAR MEDICINE PET SKULL BASE TO THIGH TECHNIQUE: 14.7 mCi F-18 FDG was injected intravenously. Full-ring PET imaging was performed from the skull base to thigh after the radiotracer. CT data was obtained and used for attenuation correction and anatomic localization. FASTING BLOOD GLUCOSE:  Value: 93 mg/dl COMPARISON:  Multiple exams, including PET-CT of 09/07/2015 and CT scan of 03/27/2017 FINDINGS: NECK No hypermetabolic lymph nodes in the neck. Stable appearance of calcified multinodular goiter. CHEST A lower right paratracheal lymph node measures 1.3 cm in short axis on image 75/4 (Stable from 03/27/2017), and has maximum standard uptake value of 4.4. A small right hilar lymph node measuring approximately 0.8 cm in short axis on image 81/4 has a maximum SUV of 4.7. Background mediastinal blood pool activity SUV 2.9. Coronary, aortic arch, and branch vessel atherosclerotic vascular disease. Scarring related to the right upper lobe wedge resection, with associated suprahilar density which is not significantly hypermetabolic. Calcified granuloma in the left upper lobe. 6 mm subpleural nodule in the lingula along the major fissure, not visibly hypermetabolic, and stable compared to prior exams including 09/07/2015. ABDOMEN/PELVIS 1.5 by 2.5 cm left adrenal mass, 10 Hounsfield units, maximum standard uptake value 5.1. This had a maximum SUV of 5.4 on 09/07/2015, and is not changed in size compared to the earliest available comparison of 12/18/2014. This was  shown to be an adrenal adenoma on MRI exam of 02/24/2015. The liver, pancreas, spleen, and right adrenal gland appear unremarkable. Bilateral renal cysts; an exophytic hyperdense lesion of the right kidney lower pole measures 2.5 cm in diameter, and was shown to be a Bosniak category 2 cyst on recent MRI. Cholelithiasis noted.  Aortoiliac atherosclerotic vascular disease. Scattered colonic diverticula especially in the sigmoid colon. Prostatomegaly. SKELETON No focal hypermetabolic activity to suggest skeletal metastasis. Sclerosis in the right pubic bone with nitrogen gas phenomenon, unchanged from prior, and thought to be benign. IMPRESSION: 1. Mildly hypermetabolic and mildly enlarged right lower paratracheal lymph node, and mildly hypermetabolic right hilar lymph node. These have SUV moderately above background blood pool activity, and accordingly concerning for potential recurrent malignancy. 2.  Aortic Atherosclerosis (ICD10-I70.0).  Coronary atherosclerosis. 3. Other imaging findings of potential clinical significance: Calcified multinodular goiter. Stable (from December 2016) 6 millimeter subpleural nodule in the lingula. Postoperative findings in the right upper lobe. Stable left adrenal adenoma. Bilateral renal cysts including a Bosniak category 2 cyst of the right kidney lower pole. Cholelithiasis. Sigmoid diverticulosis. Prostatomegaly.  Electronically Signed   By: Van Clines M.D.   On: 04/30/2017 15:21     I have independently reviewed the above radiology studies  and reviewed the findings with the patient.   Recent Lab Findings: Lab Results  Component Value Date   WBC 6.1 01/28/2015   HGB 11.8 (L) 01/28/2015   HCT 36.2 (L) 01/28/2015   PLT 178 01/28/2015   GLUCOSE 94 09/14/2016   ALT 17 09/14/2016   AST 20 09/14/2016   NA 136 09/14/2016   K 4.4 09/14/2016   CL 104 09/14/2016   CREATININE 1.18 09/14/2016   BUN 22 (H) 09/14/2016   CO2 25 09/14/2016   INR 1.05 01/22/2015       Assessment / Plan:   Patient with history Limited stage Hodgkin's and stage I lung cancer. Recent ct /pet demonstrates  Mildly hypermetabolic and mildly enlarged right lower paratracheal lymph node, and mildly hypermetabolic right hilar lymph node. These have SUV moderately above background blood pool activity, and accordingly concerning for potential recurrent malignancy.   I have recommended to the patient that we proceed with EBUS poss mediastinoscopy to confirm tissue dx of the right paratracheal nodes. Patient is agreeable.  Moderate COPD  Grace Isaac MD      [address] [place] [address] Office [phone]   Beeper [phone]  05/14/2017 3:21 PM

## 2017-05-17 ENCOUNTER — Encounter (HOSPITAL_COMMUNITY): Payer: Self-pay | Admitting: *Deleted

## 2017-05-17 NOTE — Anesthesia Preprocedure Evaluation (Addendum)
Anesthesia Evaluation  Patient identified by MRN, date of birth, ID band Patient awake    Reviewed: Allergy & Precautions, NPO status , Patient's Chart, lab work & pertinent test results  Airway Mallampati: II  TM Distance: >3 FB Neck ROM: full    Dental  (+) Teeth Intact, Dental Advisory Given   Pulmonary shortness of breath, former smoker,    Pulmonary exam normal        Cardiovascular hypertension, Normal cardiovascular exam+ dysrhythmias Atrial Fibrillation  Rhythm:Regular Rate:Normal     Neuro/Psych    GI/Hepatic   Endo/Other  Morbid obesity  Renal/GU      Musculoskeletal  (+) Arthritis ,   Abdominal   Peds  Hematology   Anesthesia Other Findings ECHO 4/18   - Left ventricle: The cavity size was normal. Wall thickness was   normal. Systolic function was vigorous. The estimated ejection   fraction was in the range of 65% to 70%. Doppler parameters are   consistent with abnormal left ventricular relaxation (grade 1   diastolic dysfunction).  Reproductive/Obstetrics                           Anesthesia Physical  Anesthesia Plan  ASA: III  Anesthesia Plan: General   Post-op Pain Management:    Induction: Intravenous  PONV Risk Score and Plan: 2 and Ondansetron, Dexamethasone and Treatment may vary due to age or medical condition  Airway Management Planned: Double Lumen EBT  Additional Equipment: Arterial line and CVP  Intra-op Plan:   Post-operative Plan: Extubation in OR  Informed Consent: I have reviewed the patients History and Physical, chart, labs and discussed the procedure including the risks, benefits and alternatives for the proposed anesthesia with the patient or authorized representative who has indicated his/her understanding and acceptance.   Dental advisory given  Plan Discussed with: CRNA, Anesthesiologist and Surgeon  Anesthesia Plan Comments:          Anesthesia Quick Evaluation

## 2017-05-17 NOTE — Progress Notes (Signed)
Pt denies SOB and chest pain. Pt under the care of Dr. Domenic Polite, Cardiology. Pt stated that he had a stress test " at least 5 years ago." Pt denies having a cardiac cath. Pt made aware to stop taking vitamins, fish oil and herbal medications. Do not take any NSAIDs ie: Ibuprofen, Advil, Naproxen (ALeve), Motrin, BC and Goody Powder. Pt verbalized understanding of all pre-op instructions.

## 2017-05-18 ENCOUNTER — Ambulatory Visit (HOSPITAL_COMMUNITY): Payer: Medicare Other | Admitting: Anesthesiology

## 2017-05-18 ENCOUNTER — Encounter (HOSPITAL_COMMUNITY)
Admission: RE | Disposition: A | Discharge status: Home / Self Care | Payer: Self-pay | Source: Ambulatory Visit | Attending: Cardiothoracic Surgery

## 2017-05-18 ENCOUNTER — Telehealth: Payer: Self-pay

## 2017-05-18 ENCOUNTER — Ambulatory Visit (HOSPITAL_COMMUNITY): Payer: Medicare Other

## 2017-05-18 ENCOUNTER — Ambulatory Visit (HOSPITAL_COMMUNITY)
Admission: RE | Admit: 2017-05-18 | Discharge: 2017-05-18 | Disposition: A | Discharge status: Home / Self Care | Payer: Medicare Other | Source: Ambulatory Visit | Attending: Cardiothoracic Surgery | Admitting: Cardiothoracic Surgery

## 2017-05-18 ENCOUNTER — Encounter (HOSPITAL_COMMUNITY): Payer: Self-pay | Admitting: *Deleted

## 2017-05-18 DIAGNOSIS — Z8571 Personal history of Hodgkin lymphoma: Secondary | ICD-10-CM | POA: Insufficient documentation

## 2017-05-18 DIAGNOSIS — I456 Pre-excitation syndrome: Secondary | ICD-10-CM | POA: Insufficient documentation

## 2017-05-18 DIAGNOSIS — Z87891 Personal history of nicotine dependence: Secondary | ICD-10-CM | POA: Insufficient documentation

## 2017-05-18 DIAGNOSIS — I10 Essential (primary) hypertension: Secondary | ICD-10-CM | POA: Insufficient documentation

## 2017-05-18 DIAGNOSIS — R591 Generalized enlarged lymph nodes: Secondary | ICD-10-CM

## 2017-05-18 DIAGNOSIS — Z09 Encounter for follow-up examination after completed treatment for conditions other than malignant neoplasm: Secondary | ICD-10-CM

## 2017-05-18 DIAGNOSIS — Z79899 Other long term (current) drug therapy: Secondary | ICD-10-CM | POA: Diagnosis not present

## 2017-05-18 DIAGNOSIS — R339 Retention of urine, unspecified: Secondary | ICD-10-CM

## 2017-05-18 DIAGNOSIS — Z923 Personal history of irradiation: Secondary | ICD-10-CM | POA: Diagnosis not present

## 2017-05-18 DIAGNOSIS — Z7982 Long term (current) use of aspirin: Secondary | ICD-10-CM | POA: Insufficient documentation

## 2017-05-18 DIAGNOSIS — Z85118 Personal history of other malignant neoplasm of bronchus and lung: Secondary | ICD-10-CM | POA: Insufficient documentation

## 2017-05-18 DIAGNOSIS — Z85828 Personal history of other malignant neoplasm of skin: Secondary | ICD-10-CM | POA: Insufficient documentation

## 2017-05-18 DIAGNOSIS — I4891 Unspecified atrial fibrillation: Secondary | ICD-10-CM | POA: Insufficient documentation

## 2017-05-18 DIAGNOSIS — C771 Secondary and unspecified malignant neoplasm of intrathoracic lymph nodes: Secondary | ICD-10-CM | POA: Diagnosis not present

## 2017-05-18 DIAGNOSIS — R590 Localized enlarged lymph nodes: Secondary | ICD-10-CM | POA: Diagnosis not present

## 2017-05-18 DIAGNOSIS — Z6836 Body mass index (BMI) 36.0-36.9, adult: Secondary | ICD-10-CM | POA: Insufficient documentation

## 2017-05-18 DIAGNOSIS — J449 Chronic obstructive pulmonary disease, unspecified: Secondary | ICD-10-CM | POA: Insufficient documentation

## 2017-05-18 DIAGNOSIS — R599 Enlarged lymph nodes, unspecified: Secondary | ICD-10-CM

## 2017-05-18 HISTORY — PX: VIDEO BRONCHOSCOPY WITH ENDOBRONCHIAL ULTRASOUND: SHX6177

## 2017-05-18 HISTORY — PX: MEDIASTINOSCOPY: SHX5086

## 2017-05-18 LAB — COMPREHENSIVE METABOLIC PANEL
ALT: 17 U/L (ref 17–63)
AST: 20 U/L (ref 15–41)
Albumin: 3.9 g/dL (ref 3.5–5.0)
Alkaline Phosphatase: 63 U/L (ref 38–126)
Anion gap: 9 (ref 5–15)
BUN: 22 mg/dL — ABNORMAL HIGH (ref 6–20)
CO2: 21 mmol/L — ABNORMAL LOW (ref 22–32)
Calcium: 9.2 mg/dL (ref 8.9–10.3)
Chloride: 109 mmol/L (ref 101–111)
Creatinine, Ser: 1.2 mg/dL (ref 0.61–1.24)
GFR calc Af Amer: >60 mL/min (ref >60)
GFR calc non Af Amer: 55 mL/min — ABNORMAL LOW (ref >60)
Glucose, Bld: 100 mg/dL — ABNORMAL HIGH (ref 65–99)
Potassium: 4.3 mmol/L (ref 3.5–5.1)
Sodium: 139 mmol/L (ref 135–145)
Total Bilirubin: 0.8 mg/dL (ref 0.3–1.2)
Total Protein: 6.5 g/dL (ref 6.5–8.1)

## 2017-05-18 LAB — APTT: aPTT: 29 seconds (ref 24–36)

## 2017-05-18 LAB — PROTIME-INR
INR: 1.02
Prothrombin Time: 13.4 seconds (ref 11.4–15.2)

## 2017-05-18 LAB — CBC
HCT: 40.4 % (ref 39.0–52.0)
Hemoglobin: 12.9 g/dL — ABNORMAL LOW (ref 13.0–17.0)
MCH: 28.8 pg (ref 26.0–34.0)
MCHC: 31.9 g/dL (ref 30.0–36.0)
MCV: 90.2 fL (ref 78.0–100.0)
Platelets: 195 10*3/uL (ref 150–400)
RBC: 4.48 MIL/uL (ref 4.22–5.81)
RDW: 13.9 % (ref 11.5–15.5)
WBC: 4.7 10*3/uL (ref 4.0–10.5)

## 2017-05-18 LAB — TYPE AND SCREEN
ABO/RH(D): O POS
Antibody Screen: NEGATIVE

## 2017-05-18 SURGERY — BRONCHOSCOPY, WITH EBUS
Anesthesia: General

## 2017-05-18 MED ORDER — TAMSULOSIN HCL 0.4 MG PO CAPS: 0.4000 mg | ORAL_CAPSULE | Freq: Every day | ORAL | 0 refills | Status: AC

## 2017-05-18 MED ORDER — FENTANYL CITRATE (PF) 100 MCG/2ML IJ SOLN
INTRAMUSCULAR | Status: DC | PRN
  Administered 2017-05-18 (×2): 50 ug via INTRAVENOUS
  Administered 2017-05-18: 150 ug via INTRAVENOUS
  Administered 2017-05-18 (×2): 50 ug via INTRAVENOUS

## 2017-05-18 MED ORDER — FENTANYL CITRATE (PF) 250 MCG/5ML IJ SOLN
INTRAMUSCULAR | Status: AC
  Filled 2017-05-18: qty 5

## 2017-05-18 MED ORDER — ONDANSETRON HCL 4 MG/2ML IJ SOLN: 4.0000 mg | Freq: Once | INTRAMUSCULAR | Status: DC | PRN

## 2017-05-18 MED ORDER — EPINEPHRINE PF 1 MG/ML IJ SOLN
INTRAMUSCULAR | Status: AC
  Filled 2017-05-18: qty 1

## 2017-05-18 MED ORDER — PHENYLEPHRINE HCL 10 MG/ML IJ SOLN
INTRAMUSCULAR | Status: DC | PRN
  Administered 2017-05-18 (×2): 120 ug via INTRAVENOUS
  Administered 2017-05-18 (×2): 80 ug via INTRAVENOUS

## 2017-05-18 MED ORDER — SUGAMMADEX SODIUM 500 MG/5ML IV SOLN
INTRAVENOUS | Status: AC
  Filled 2017-05-18: qty 5

## 2017-05-18 MED ORDER — LACTATED RINGERS IV SOLN
INTRAVENOUS | Status: DC | PRN
  Administered 2017-05-18 (×2): via INTRAVENOUS

## 2017-05-18 MED ORDER — SUGAMMADEX SODIUM 500 MG/5ML IV SOLN
INTRAVENOUS | Status: DC | PRN
  Administered 2017-05-18: 250 mg via INTRAVENOUS

## 2017-05-18 MED ORDER — ROCURONIUM BROMIDE 100 MG/10ML IV SOLN
INTRAVENOUS | Status: DC | PRN
  Administered 2017-05-18: 50 mg via INTRAVENOUS
  Administered 2017-05-18: 10 mg via INTRAVENOUS
  Administered 2017-05-18 (×2): 20 mg via INTRAVENOUS

## 2017-05-18 MED ORDER — GLYCOPYRROLATE 0.2 MG/ML IJ SOLN
INTRAMUSCULAR | Status: DC | PRN
  Administered 2017-05-18: .4 mg via INTRAVENOUS

## 2017-05-18 MED ORDER — DEXAMETHASONE SODIUM PHOSPHATE 10 MG/ML IJ SOLN
INTRAMUSCULAR | Status: AC
  Filled 2017-05-18: qty 2

## 2017-05-18 MED ORDER — ONDANSETRON HCL 4 MG/2ML IJ SOLN
INTRAMUSCULAR | Status: DC | PRN
  Administered 2017-05-18: 4 mg via INTRAVENOUS

## 2017-05-18 MED ORDER — MEPERIDINE HCL 25 MG/ML IJ SOLN: 6.2500 mg | INTRAMUSCULAR | Status: DC | PRN

## 2017-05-18 MED ORDER — PROPOFOL 10 MG/ML IV BOLUS
INTRAVENOUS | Status: AC
  Filled 2017-05-18: qty 20

## 2017-05-18 MED ORDER — CEFUROXIME SODIUM 1.5 G IV SOLR
INTRAVENOUS | Status: AC
  Filled 2017-05-18: qty 1.5

## 2017-05-18 MED ORDER — SUCCINYLCHOLINE CHLORIDE 200 MG/10ML IV SOSY
PREFILLED_SYRINGE | INTRAVENOUS | Status: AC
  Filled 2017-05-18: qty 20

## 2017-05-18 MED ORDER — PHENYLEPHRINE HCL 10 MG/ML IJ SOLN
INTRAVENOUS | Status: DC | PRN
  Administered 2017-05-18: 50 ug/min via INTRAVENOUS

## 2017-05-18 MED ORDER — DEXAMETHASONE SODIUM PHOSPHATE 10 MG/ML IJ SOLN
INTRAMUSCULAR | Status: DC | PRN
  Administered 2017-05-18: 5 mg via INTRAVENOUS

## 2017-05-18 MED ORDER — ROCURONIUM BROMIDE 50 MG/5ML IV SOLN
INTRAVENOUS | Status: AC
  Filled 2017-05-18: qty 3

## 2017-05-18 MED ORDER — FENTANYL CITRATE (PF) 100 MCG/2ML IJ SOLN: 25.0000 ug | INTRAMUSCULAR | Status: DC | PRN

## 2017-05-18 MED ORDER — EPHEDRINE 5 MG/ML INJ
INTRAVENOUS | Status: AC
  Filled 2017-05-18: qty 20

## 2017-05-18 MED ORDER — MIDAZOLAM HCL 2 MG/2ML IJ SOLN
INTRAMUSCULAR | Status: AC
  Filled 2017-05-18: qty 2

## 2017-05-18 MED ORDER — EPHEDRINE SULFATE 50 MG/ML IJ SOLN
INTRAMUSCULAR | Status: DC | PRN
  Administered 2017-05-18: 5 mg via INTRAVENOUS
  Administered 2017-05-18: 15 mg via INTRAVENOUS
  Administered 2017-05-18: 20 mg via INTRAVENOUS

## 2017-05-18 MED ORDER — PHENYLEPHRINE 40 MCG/ML (10ML) SYRINGE FOR IV PUSH (FOR BLOOD PRESSURE SUPPORT)
PREFILLED_SYRINGE | INTRAVENOUS | Status: AC
  Filled 2017-05-18: qty 30

## 2017-05-18 MED ORDER — DEXTROSE 5 % IV SOLN
1.5000 g | INTRAVENOUS | Status: AC
  Administered 2017-05-18: 80 g via INTRAVENOUS
  Administered 2017-05-18: 1.5 g via INTRAVENOUS

## 2017-05-18 MED ORDER — 0.9 % SODIUM CHLORIDE (POUR BTL) OPTIME
TOPICAL | Status: DC | PRN
  Administered 2017-05-18 (×2): 1000 mL

## 2017-05-18 MED ORDER — ONDANSETRON HCL 4 MG/2ML IJ SOLN
INTRAMUSCULAR | Status: AC
  Filled 2017-05-18: qty 2

## 2017-05-18 MED ORDER — LIDOCAINE HCL (CARDIAC) 20 MG/ML IV SOLN
INTRAVENOUS | Status: DC | PRN
  Administered 2017-05-18: 100 mg via INTRAVENOUS

## 2017-05-18 MED ORDER — LIDOCAINE HCL (CARDIAC) 20 MG/ML IV SOLN
INTRAVENOUS | Status: AC
  Filled 2017-05-18: qty 10

## 2017-05-18 MED ORDER — TRAMADOL HCL 50 MG PO TABS: 50.0000 mg | ORAL_TABLET | Freq: Four times a day (QID) | ORAL | 0 refills | Status: AC | PRN

## 2017-05-18 SURGICAL SUPPLY — 58 items
APPLICATOR COTTON TIP 6IN STRL (MISCELLANEOUS) ×2 IMPLANT
BLADE SURG 10 STRL SS (BLADE) IMPLANT
BRUSH CYTOL CELLEBRITY 1.5X140 (MISCELLANEOUS) IMPLANT
CANISTER SUCT 3000ML PPV (MISCELLANEOUS) ×2 IMPLANT
CLIP TI MEDIUM 6 (CLIP) ×2 IMPLANT
CLIP VESOCCLUDE MED 6/CT (Clip) IMPLANT
CONT SPEC 4OZ CLIKSEAL STRL BL (MISCELLANEOUS) ×8 IMPLANT
CONT SPECI 4OZ STER CLIK (MISCELLANEOUS) ×2 IMPLANT
COVER BACK TABLE 60X90IN (DRAPES) ×2 IMPLANT
COVER DOME SNAP 22 D (MISCELLANEOUS) ×4 IMPLANT
COVER SURGICAL LIGHT HANDLE (MISCELLANEOUS) ×2 IMPLANT
DERMABOND ADVANCED (GAUZE/BANDAGES/DRESSINGS) ×1
DERMABOND ADVANCED .7 DNX12 (GAUZE/BANDAGES/DRESSINGS) ×1 IMPLANT
DRAPE LAPAROTOMY T 102X78X121 (DRAPES) ×2 IMPLANT
DRSG AQUACEL AG ADV 3.5X14 (GAUZE/BANDAGES/DRESSINGS) IMPLANT
ELECT CAUTERY BLADE 6.4 (BLADE) ×2 IMPLANT
ELECT REM PT RETURN 9FT ADLT (ELECTROSURGICAL)
ELECTRODE REM PT RTRN 9FT ADLT (ELECTROSURGICAL) IMPLANT
FORCEPS BIOP RJ4 1.8 (CUTTING FORCEPS) IMPLANT
GAUZE SPONGE 4X4 12PLY STRL (GAUZE/BANDAGES/DRESSINGS) ×2 IMPLANT
GAUZE SPONGE 4X4 16PLY XRAY LF (GAUZE/BANDAGES/DRESSINGS) IMPLANT
GLOVE BIO SURGEON STRL SZ 6.5 (GLOVE) ×4 IMPLANT
GLOVE BIO SURGEON STRL SZ7 (GLOVE) ×4 IMPLANT
GLOVE SURG SS PI 6.5 STRL IVOR (GLOVE) ×2 IMPLANT
GOWN STRL NON-REIN LRG LVL3 (GOWN DISPOSABLE) ×2 IMPLANT
GOWN STRL REUS W/ TWL LRG LVL3 (GOWN DISPOSABLE) ×2 IMPLANT
GOWN STRL REUS W/ TWL XL LVL3 (GOWN DISPOSABLE) ×2 IMPLANT
GOWN STRL REUS W/TWL LRG LVL3 (GOWN DISPOSABLE) ×2
GOWN STRL REUS W/TWL XL LVL3 (GOWN DISPOSABLE) ×2
HEMOSTAT SURGICEL 2X14 (HEMOSTASIS) IMPLANT
KIT BASIN OR (CUSTOM PROCEDURE TRAY) IMPLANT
KIT CLEAN ENDO COMPLIANCE (KITS) ×4 IMPLANT
KIT ROOM TURNOVER OR (KITS) ×2 IMPLANT
MARKER SKIN DUAL TIP RULER LAB (MISCELLANEOUS) ×2 IMPLANT
NEEDLE BLUNT 18X1 FOR OR ONLY (NEEDLE) IMPLANT
NEEDLE EBUS SONO TIP PENTAX (NEEDLE) ×4 IMPLANT
NS IRRIG 1000ML POUR BTL (IV SOLUTION) ×4 IMPLANT
OIL SILICONE PENTAX (PARTS (SERVICE/REPAIRS)) ×2 IMPLANT
PACK SURGICAL SETUP 50X90 (CUSTOM PROCEDURE TRAY) ×2 IMPLANT
PAD ARMBOARD 7.5X6 YLW CONV (MISCELLANEOUS) ×8 IMPLANT
PENCIL BUTTON HOLSTER BLD 10FT (ELECTRODE) IMPLANT
SPONGE INTESTINAL PEANUT (DISPOSABLE) ×2 IMPLANT
STAPLER VISISTAT 35W (STAPLE) IMPLANT
SUT VIC AB 3-0 SH 18 (SUTURE) IMPLANT
SUT VICRYL 4-0 PS2 18IN ABS (SUTURE) IMPLANT
SWAB COLLECTION DEVICE MRSA (MISCELLANEOUS) IMPLANT
SWAB CULTURE ESWAB REG 1ML (MISCELLANEOUS) IMPLANT
SYR 10ML LL (SYRINGE) ×4 IMPLANT
SYR 20CC LL (SYRINGE) ×4 IMPLANT
SYR 20ML ECCENTRIC (SYRINGE) ×2 IMPLANT
TOWEL GREEN STERILE (TOWEL DISPOSABLE) IMPLANT
TOWEL GREEN STERILE FF (TOWEL DISPOSABLE) ×2 IMPLANT
TOWEL OR 17X24 6PK STRL BLUE (TOWEL DISPOSABLE) ×4 IMPLANT
TOWEL OR 17X26 10 PK STRL BLUE (TOWEL DISPOSABLE) ×2 IMPLANT
TRAP SPECIMEN MUCOUS 40CC (MISCELLANEOUS) ×2 IMPLANT
TUBE CONNECTING 12X1/4 (SUCTIONS) ×2 IMPLANT
TUBE CONNECTING 20X1/4 (TUBING) ×2 IMPLANT
WATER STERILE IRR 1000ML POUR (IV SOLUTION) ×2 IMPLANT

## 2017-05-18 NOTE — Telephone Encounter (Signed)
Patient is having trouble urinating after EBUS this am. Called in Flomax 0.4 mg daily x 5 days to [facility]. Patient was instructed to go to ED if he is unable to urinate by 530 pm today. Per Dr Servando Snare

## 2017-05-18 NOTE — Anesthesia Procedure Notes (Signed)
Procedure Name: Intubation Date/Time: 05/18/2017 7:41 AM Performed by: Salli Quarry Mattilyn Crites Pre-anesthesia Checklist: Patient identified, Emergency Drugs available, Suction available and Patient being monitored Patient Re-evaluated:Patient Re-evaluated prior to induction Oxygen Delivery Method: Circle System Utilized Preoxygenation: Pre-oxygenation with 100% oxygen Induction Type: IV induction Ventilation: Mask ventilation without difficulty and Oral airway inserted - appropriate to patient size Laryngoscope Size: Mac and 4 Grade View: Grade I Tube type: Oral Tube size: 8.0 mm Number of attempts: 1 Airway Equipment and Method: Stylet and Oral airway Placement Confirmation: ETT inserted through vocal cords under direct vision,  positive ETCO2 and breath sounds checked- equal and bilateral Secured at: 25 cm Tube secured with: Tape Dental Injury: Teeth and Oropharynx as per pre-operative assessment

## 2017-05-18 NOTE — Anesthesia Postprocedure Evaluation (Signed)
Anesthesia Post Note  Patient: Dakota Shannon  Procedure(s) Performed: Procedure(s) (LRB): VIDEO BRONCHOSCOPY WITH ENDOBRONCHIAL ULTRASOUND (N/A) MEDIASTINOSCOPY (N/A)     Patient location during evaluation: PACU Anesthesia Type: General Level of consciousness: awake and alert Pain management: pain level controlled Vital Signs Assessment: post-procedure vital signs reviewed and stable Respiratory status: spontaneous breathing, nonlabored ventilation, respiratory function stable and patient connected to nasal cannula oxygen Cardiovascular status: blood pressure returned to baseline and stable Postop Assessment: no signs of nausea or vomiting Anesthetic complications: no    Last Vitals:  Vitals:   05/18/17 1127 05/18/17 1130  BP:  106/60  Pulse:  68  Resp:    Temp: 36.6 C     Last Pain:  Vitals:   05/18/17 0618  TempSrc: Oral                 Elfego Giammarino

## 2017-05-18 NOTE — Discharge Instructions (Signed)
Flexible Bronchoscopy, Care After °This sheet gives you information about how to care for yourself after your procedure. Your health care provider may also give you more specific instructions. If you have problems or questions, contact your health care provider. °What can I expect after the procedure? °After the procedure, it is common to have the following symptoms for 24-48 hours: °· A cough that is worse than it was before the procedure. °· A low-grade fever. °· A sore throat or hoarse voice. °· Small streaks of blood in the mucus from your lungs (sputum), if tissue samples were removed (biopsy). ° °Follow these instructions at home: °Eating and drinking °· Do not eat or drink anything (including water) for 2 hours after your procedure, or until your numbing medicine (local anesthetic) has worn off. Having a numb throat increases your risk of burning yourself or choking. °· After your numbness is gone and your cough and gag reflexes have returned, you may start eating only soft foods and slowly drinking liquids. °· The day after the procedure, return to your normal diet. °Driving °· Do not drive for 24 hours if you were given a medicine to help you relax (sedative). °· Do not drive or use heavy machinery while taking prescription pain medicine. °General instructions °· Take over-the-counter and prescription medicines only as told by your health care provider. °· Return to your normal activities as told by your health care provider. Ask your health care provider what activities are safe for you. °· Do not use any products that contain nicotine or tobacco, such as cigarettes and e-cigarettes. If you need help quitting, ask your health care provider. °· Keep all follow-up visits as told by your health care provider. This is important, especially if you had a biopsy taken. °Get help right away if: °· You have shortness of breath that gets worse. °· You become light-headed or feel like you might faint. °· You have  chest pain. °· You cough up more than a small amount of blood. °· The amount of blood you cough up increases. °Summary °· Common symptoms in the 24-48 hours following a flexible bronchoscopy include cough, low-grade fever, sore throat or hoarse voice, and blood-streaked mucus from the lungs (if you had a biopsy). °· Do not eat or drink anything (including water) for 2 hours after your procedure, or until your local anesthetic has worn off. You can return to your normal diet the day after the procedure. °· Get help right away if you develop worsening shortness of breath, have chest pain, become light-headed, or cough up more than a small amount of blood. °This information is not intended to replace advice given to you by your health care provider. Make sure you discuss any questions you have with your health care provider. °Document Released: 05/12/2005 Document Revised: 11/10/2016 Document Reviewed: 11/10/2016 °Elsevier Interactive Patient Education © 2017 Elsevier Inc. ° °

## 2017-05-18 NOTE — Brief Op Note (Signed)
      [address]       [place] [address]             [phone]      05/18/2017  10:39 AM  PATIENT:  Dakota Shannon  82 y.o. male  PRE-OPERATIVE DIAGNOSIS: mediastinal  ADENOPATHY- 4r node   POST-OPERATIVE DIAGNOSIS:  Same- benign by quick satin and frozen section   PROCEDURE:  Procedure(s): VIDEO BRONCHOSCOPY WITH ENDOBRONCHIAL ULTRASOUND (N/A) MEDIASTINOSCOPY (N/A) with biopsy   SURGEON:  Surgeon(s) and Role:    * Grace Isaac, MD - Primary    ANESTHESIA:   general  EBL:  Total I/O In: 1200 [I.V.:1200] Out: 50 [Blood:50]  BLOOD ADMINISTERED:none  DRAINS: none   LOCAL MEDICATIONS USED:  NONE  SPECIMEN:  Source of Specimen:  4 R nodes   DISPOSITION OF SPECIMEN:  PATHOLOGY  COUNTS:  YES   DICTATION: .Dragon Dictation  PLAN OF CARE: Discharge to home after PACU  PATIENT DISPOSITION:  PACU - hemodynamically stable.   Delay start of Pharmacological VTE agent (>24hrs) due to surgical blood loss or risk of bleeding: yes

## 2017-05-18 NOTE — H&P (Signed)
[address]       [place] [address]             [phone]                    Saw N Odor Corona Medical Record #[id number] Date of Birth: Mar 31, 1935  Referring: Javier Glazier, MD Primary Care: [name], MD  Chief Complaint:    No chief complaint on file. Cancer Staging Lung cancer, right upper lobe Staging form: Lung, AJCC 7th Edition - Pathologic stage from 02/01/2015: Stage IB (T2a, N0, cM0) - Signed by Grace Isaac, MD on 02/03/2015 - Clinical: Stage IB (T2a, N0, M0) - Unsigned - Pathologic: No stage assigned - Unsigned    History of Present Illness:    Dakota Shannon 82 y.o. male is seen in the office for evaluation of mediastinal adenopathy. In 2016 patient had dx of hodgkin's in nodes in the right neck and adenocarcinoma of the right upper lobe . He under went wedge resection and nodes sampling.He was treated for Hodgkin's Lymphoma in May 2016. He was treated with XRT to his neck He has had increasing difficulty swallowing .     Current Activity/ Functional Status:  Patient is independent with mobility/ambulation, transfers, ADL's, IADL's.   Zubrod Score: At the time of surgery this patient's most appropriate activity status/level should be described as: []     0    Normal activity, no symptoms [x]     1    Restricted in physical strenuous activity but ambulatory, able to do out light work []     2    Ambulatory and capable of self care, unable to do work activities, up and about               >50 % of waking hours                              []     3    Only limited self care, in bed greater than 50% of waking hours []     4    Completely disabled, no self care, confined to bed or chair []     5    Moribund   Past Medical History:  Diagnosis Date  . Adenocarcinoma of lung [facility])    Resected March 2016  . Arthritis   . Atrial fibrillation ([place])    Possible history based on limited information  . Complication of  anesthesia    difficulty urinating  . COPD (chronic obstructive pulmonary disease) ([place])   . Diverticulosis   . Essential hypertension, benign   . Hodgkin's lymphoma ([place])   . Mixed hyperlipidemia   . Swallowing difficulty   . Wolff-Parkinson-White (WPW) syndrome    Possible history based on limited information    Past Surgical History:  Procedure Laterality Date  . CATARACT EXTRACTION    . COLONOSCOPY    . NECK LESION BIOPSY    . SKIN CANCER EXCISION    . VIDEO ASSISTED THORACOSCOPY (VATS)/WEDGE RESECTION Right 01/26/2015   Procedure: RIGHT VIDEO ASSISTED THORACOSCOPY (VATS)WEDGE RESECTION OF RIGHT UPPER LOBE, LYMPH NODE DISSECTION;  Surgeon: Grace Isaac, MD;  Location: [place];  Service: Thoracic;  Laterality: Right;  Marland Kitchen VIDEO BRONCHOSCOPY N/A 01/26/2015   Procedure: VIDEO BRONCHOSCOPY;  Surgeon: Grace Isaac, MD;  Location: [place];  Service: Thoracic;  Laterality: N/A;  Family History  Problem Relation Age of Onset  . Diabetes Mother   . Lung cancer Father   . Diabetes Brother   . Lung cancer Brother   . Lung disease Neg Hx     Social History   Social History  . Marital status: Widowed    Spouse name: N/A  . Number of children: N/A  . Years of education: N/A   Occupational History  . Retired    Social History Main Topics  . Smoking status: Former Smoker    Packs/day: 0.50    Years: 33.00    Types: Cigarettes    Start date: 02/27/1947    Quit date: 11/07/1979  . Smokeless tobacco: Never Used  . Alcohol use No  . Drug use: No  . Sexual activity: Not on file   Other Topics Concern  . Not on file   Social History Narrative   [facility] (05/04/17):   Originally from [place]. He served in [employer]. He was in [employer] in [place]. He was in [place], [place], & [place]. 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 Has a dog and cats at home. No bird exposure. No mold  exposure. Questionable exposure to asbestos.     History  Smoking Status  . Former Smoker  . Packs/day: 0.50  . Years: 33.00  . Types: Cigarettes  . Start date: 02/27/1947  . Quit date: 11/07/1979  Smokeless Tobacco  . Never Used    History  Alcohol Use No     Allergies  Allergen Reactions  . No Known Allergies     Current Facility-Administered Medications  Medication Dose Route Frequency Provider Last Rate Last Dose  . cefUROXime (ZINACEF) 1.5 g in dextrose 5 % 50 mL IVPB  1.5 g Intravenous To SSTC Grace Isaac, MD      . dextrose 5 % with cefUROXime (ZINACEF) ADS Med             Pertinent items are noted in HPI.   Review of Systems:     Cardiac Review of Systems: Y or N  Chest Pain [ n   ]  Resting SOB [n   ] Exertional SOB  [ y ]  Vertell Limber Florencio.Farrier  ]   Pedal Edema [  y ]    Palpitations [  n] Syncope  [n  ]   Presyncope [ n  ]  General Review of Systems: [Y] = yes [  ]=no Constitional: recent weight change [  ];  Wt loss over the last 3 months [   ] anorexia [  ]; fatigue [  ]; nausea [  ]; night sweats [  ]; fever [  ]; or chills [  ];          Dental: poor dentition[  ]; Last Dentist visit:   Eye : blurred vision [  ]; diplopia [   ]; vision changes [  ];  Amaurosis fugax[  ]; Resp: cough [  ];  wheezing[ n ];  hemoptysis[n  ]; shortness of breath[ n ]; paroxysmal nocturnal dyspnea[  ]; dyspnea on exertion[y  ]; or orthopnea[  ];  GI:  gallstones[  ], vomiting[  ];  dysphagia[  ]; melena[  ];  hematochezia [  ]; heartburn[  ];   Hx of  Colonoscopy[  ]; GU: kidney stones [  ]; hematuria[  ];   dysuria [  ];  nocturia[  ];  history of     obstruction [  ]; urinary frequency [  ]             Skin: rash, swelling[  ];, hair loss[  ];  peripheral edema[  ];  or itching[  ]; Musculosketetal: myalgias[  ];  joint swelling[  ];  joint erythema[  ];  joint pain[  ];  back pain[  ];  Heme/Lymph: bruising[  ];  bleeding[  ];  anemia[  ];  Neuro: TIA[  ];  headaches[  ];   stroke[  ];  vertigo[  ];  seizures[  ];   paresthesias[  ];  difficulty walking[n  ];  Psych:depression[  ]; anxiety[  ];  Endocrine: diabetes[  ];  thyroid dysfunction[  ];  Immunizations: Flu up to date [  ]; Pneumococcal up to date [  ];  Other:  Physical Exam: BP (!) 151/57   Pulse (!) 51   Temp 97.9 F (36.6 C) (Oral)   Resp 20   SpO2 96%   PHYSICAL EXAMINATION: General appearance: alert, cooperative and appears older than stated age Head: Normocephalic, without obvious abnormality, atraumatic Neck: no adenopathy, no carotid bruit, no JVD, supple, symmetrical, trachea midline and thyroid not enlarged, symmetric, no tenderness/mass/nodules Lymph nodes: Cervical, supraclavicular, and axillary nodes normal. Resp: diminished breath sounds bibasilar Back: symmetric, no curvature. ROM normal. No CVA tenderness. Cardio: regular rate and rhythm, S1, S2 normal, no murmur, click, rub or gallop GI: soft, non-tender; bowel sounds normal; no masses,  no organomegaly Extremities: extremities normal, atraumatic, no cyanosis or edema and Homans sign is negative, no sign of DVT Neurologic: Grossly normal  Diagnostic Studies & Laboratory data:     Recent Radiology Findings:   Dg Esophagus  Result Date: 05/01/2017 CLINICAL DATA:  Cervical dysphagia minute for solids, symptoms for years but worsening ; past history of atrial fibrillation, Hodgkin's lymphoma, lung cancer post resection and radiation therapy EXAM: ESOPHOGRAM / BARIUM SWALLOW / BARIUM TABLET STUDY TECHNIQUE: Combined double contrast and single contrast examination performed using effervescent crystals, thick barium liquid, and thin barium liquid. The patient was observed with fluoroscopy swallowing a 13 mm barium sulphate tablet. FLUOROSCOPY TIME:  Fluoroscopy Time:  4 minutes 6 seconds Radiation Exposure Index (if provided by the fluoroscopic device): 133.9 mGy Number of Acquired Spot Images: multiple fluoroscopic screen captures  COMPARISON:  None. FINDINGS: Esophageal distention: Narrowing at the gastroesophageal junction. Remainder of esophagus distends normally. Filling defects:  None 12.5 mm barium tablet: Obstructed at the gastroesophageal junction. Could not pass into the stomach despite multiple swallows of water. Contrast and water column extended proximally within the esophagus to the level of the inferior cervical esophagus. This re-created the patient's symptoms. After several additional swallows of thick barium the pill eventually crossed into the stomach. Motility: Severe diffuse esophageal motility with incomplete clearance of barium by primary peristaltic waves. Weak inefficient secondary waves and numerous tertiary waves were identified as well as significant retrograde peristalsis. Mucosa:  Smooth without irregularity or ulceration Hypopharynx/cervical esophagus: No laryngeal penetration or aspiration. No residuals. Hiatal hernia:  Absent GE reflux:  Not witnessed during exam Other:  Atherosclerotic calcification aorta. IMPRESSION: Severe diffuse impairment of esophageal motility. Stricture at the gastroesophageal junction which obstructed a 12.5 mm diameter barium tablet for several minutes ; tall column of contrast and water above the pill extended to the cervical esophagus, re-creating patient's symptoms. With additional swallows of heavy barium the pill eventually passed into the stomach.  Aortic Atherosclerosis (ICD10-I70.0). Electronically Signed   By: Lavonia Dana M.D.   On: 05/01/2017 10:02   Nm Pet Image Restag (ps) Skull Base To Thigh  Result Date: 04/30/2017 CLINICAL DATA:  Subsequent treatment strategy for right upper lobe adenocarcinoma. History of Hodgkin's lymphoma. EXAM: NUCLEAR MEDICINE PET SKULL BASE TO THIGH TECHNIQUE: 14.7 mCi F-18 FDG was injected intravenously. Full-ring PET imaging was performed from the skull base to thigh after the radiotracer. CT data was obtained and used for attenuation  correction and anatomic localization. FASTING BLOOD GLUCOSE:  Value: 93 mg/dl COMPARISON:  Multiple exams, including PET-CT of 09/07/2015 and CT scan of 03/27/2017 FINDINGS: NECK No hypermetabolic lymph nodes in the neck. Stable appearance of calcified multinodular goiter. CHEST A lower right paratracheal lymph node measures 1.3 cm in short axis on image 75/4 (Stable from 03/27/2017), and has maximum standard uptake value of 4.4. A small right hilar lymph node measuring approximately 0.8 cm in short axis on image 81/4 has a maximum SUV of 4.7. Background mediastinal blood pool activity SUV 2.9. Coronary, aortic arch, and branch vessel atherosclerotic vascular disease. Scarring related to the right upper lobe wedge resection, with associated suprahilar density which is not significantly hypermetabolic. Calcified granuloma in the left upper lobe. 6 mm subpleural nodule in the lingula along the major fissure, not visibly hypermetabolic, and stable compared to prior exams including 09/07/2015. ABDOMEN/PELVIS 1.5 by 2.5 cm left adrenal mass, 10 Hounsfield units, maximum standard uptake value 5.1. This had a maximum SUV of 5.4 on 09/07/2015, and is not changed in size compared to the earliest available comparison of 12/18/2014. This was shown to be an adrenal adenoma on MRI exam of 02/24/2015. The liver, pancreas, spleen, and right adrenal gland appear unremarkable. Bilateral renal cysts; an exophytic hyperdense lesion of the right kidney lower pole measures 2.5 cm in diameter, and was shown to be a Bosniak category 2 cyst on recent MRI. Cholelithiasis noted.  Aortoiliac atherosclerotic vascular disease. Scattered colonic diverticula especially in the sigmoid colon. Prostatomegaly. SKELETON No focal hypermetabolic activity to suggest skeletal metastasis. Sclerosis in the right pubic bone with nitrogen gas phenomenon, unchanged from prior, and thought to be benign. IMPRESSION: 1. Mildly hypermetabolic and mildly enlarged  right lower paratracheal lymph node, and mildly hypermetabolic right hilar lymph node. These have SUV moderately above background blood pool activity, and accordingly concerning for potential recurrent malignancy. 2.  Aortic Atherosclerosis (ICD10-I70.0).  Coronary atherosclerosis. 3. Other imaging findings of potential clinical significance: Calcified multinodular goiter. Stable (from December 2016) 6 millimeter subpleural nodule in the lingula. Postoperative findings in the right upper lobe. Stable left adrenal adenoma. Bilateral renal cysts including a Bosniak category 2 cyst of the right kidney lower pole. Cholelithiasis. Sigmoid diverticulosis. Prostatomegaly. Electronically Signed   By: Van Clines M.D.   On: 04/30/2017 15:21     I have independently reviewed the above radiology studies  and reviewed the findings with the patient.   Recent Lab Findings: Lab Results  Component Value Date   WBC 4.7 05/18/2017   HGB 12.9 (L) 05/18/2017   HCT 40.4 05/18/2017   PLT 195 05/18/2017   GLUCOSE 94 09/14/2016   ALT 17 09/14/2016   AST 20 09/14/2016   NA 136 09/14/2016   K 4.4 09/14/2016   CL 104 09/14/2016   CREATININE 1.18 09/14/2016   BUN 22 (H) 09/14/2016   CO2 25 09/14/2016   INR 1.02 05/18/2017      Assessment / Plan:   Patient with history Limited  stage Hodgkin's and stage I lung cancer. Recent ct /pet demonstrates  Mildly hypermetabolic and mildly enlarged right lower paratracheal lymph node, and mildly hypermetabolic right hilar lymph node. These have SUV moderately above background blood pool activity, and accordingly concerning for potential recurrent malignancy.   I have recommended to the patient that we proceed with EBUS poss mediastinoscopy to confirm tissue dx of the right paratracheal nodes. Patient is agreeable.  Moderate COPD  The goals risks and alternatives of the planned surgical procedure Procedure(s): VIDEO BRONCHOSCOPY WITH ENDOBRONCHIAL ULTRASOUND  (N/A) possible MEDIASTINOSCOPY (N/A)  have been discussed with the patient in detail. The risks of the procedure including death, infection, stroke, myocardial infarction, bleeding, blood transfusion have all been discussed specifically.  I have quoted Maia Breslow a 1 % of perioperative mortality and a complication rate as high as 10 %. The patient's questions have been answered.Maia Breslow is willing  to proceed with the planned procedure.  Grace Isaac MD      [address] [place] [address] Office [phone]   Beeper [phone]  05/18/2017 7:10 AM

## 2017-05-18 NOTE — Transfer of Care (Signed)
Immediate Anesthesia Transfer of Care Note  Patient: Dakota Shannon  Procedure(s) Performed: Procedure(s): VIDEO BRONCHOSCOPY WITH ENDOBRONCHIAL ULTRASOUND (N/A) MEDIASTINOSCOPY (N/A)  Patient Location: PACU  Anesthesia Type:General  Level of Consciousness: awake, alert , oriented and patient cooperative  Airway & Oxygen Therapy: Patient Spontanous Breathing and Patient connected to nasal cannula oxygen  Post-op Assessment: Report given to RN and Post -op Vital signs reviewed and stable  Post vital signs: Reviewed and stable  Last Vitals:  Vitals:   05/18/17 0618  BP: (!) 151/57  Pulse: (!) 51  Resp: 20  Temp: 36.6 C    Last Pain:  Vitals:   05/18/17 0618  TempSrc: Oral      Patients Stated Pain Goal: 5 (72/89/79 1504)  Complications: No apparent anesthesia complications

## 2017-05-19 ENCOUNTER — Encounter (HOSPITAL_COMMUNITY): Payer: Self-pay | Admitting: Cardiothoracic Surgery

## 2017-05-19 NOTE — Op Note (Deleted)
  The note originally documented on this encounter has been moved the the encounter in which it belongs.  

## 2017-05-19 NOTE — Op Note (Signed)
NAMEKASHAUN, BEBO NO.:  [id number]  MEDICAL RECORD NO.:  [id number]  LOCATION:                                 FACILITY:  PHYSICIAN:  Lanelle Bal, MD    DATE OF BIRTH:  [birth year]  DATE OF PROCEDURE:  05/18/2017 DATE OF DISCHARGE:                              OPERATIVE REPORT   PREOPERATIVE DIAGNOSIS:  Right hypermetabolic right paratracheal node with history of previously resected stage I lung cancer and Hodgkin disease, localized.  POSTOPERATIVE DIAGNOSIS:  Right hypermetabolic right paratracheal node with history of previously resected stage I lung cancer and Hodgkin disease, localized.  PRELIMINARY PATHOLOGY:  Benign anthracotic lymph nodes.  Final Path positive for adeno CA of Lung   PROCEDURE PERFORMED:  Video bronchoscopy, EBUS with transbronchial biopsies of 4R lymph nodes, mediastinoscopy with biopsy.  BRIEF HISTORY:  The patient is an 82 year old male, who presented 3 years previously with enlarged right neck nodes and also a right upper lobe lung nodule.  Evaluation that time showed some mid stage Hodgkin involving the right neck and stage I non-small cell carcinoma.  The patient was treated with radiation to the neck and wedge resection with node dissection of the right upper lobe.  He has been [facility] in [facility].  Recent CT scan suggested some enlargement of right paratracheal node.  This was mildly hypermetabolic on PET scan.  The patient was referred for biopsy.  Risks and options were discussed with the patient in detail.  We have recommended proceeding with a biopsy including EBUS and if no diagnosis made, proceeding with mediastinoscopy to obtain more tissue.  DESCRIPTION OF PROCEDURE:  The patient underwent general endotracheal anesthesia without incident.  Single-lumen endotracheal tube was placed. Appropriate time-out was performed.  We then proceeded with fiberoptic bronchoscopy to the subsegmental level,  both left and right tracheobronchial tree without evidence of endobronchial lesions.  We then removed the scope and placed EBUS scope.  Initial survey with the EBUS scope did not show any significantly enlarged #7 lymph nodes.  We then positioned the scope along the right trachea and in the area of 4R nodes, an easily identifiable, enlarged lymph node was seen.  This corresponded with the node evident on CT and PET.  We then made multiple passes of this node with transbronchial needle biopsy and multiple aspirations and smears were obtained and submitted to Pathology.  Dr. Benjamine Mola reviewed the quick smears and saw only benign nodal tissue without evidence of malignancy.  In spite of the fact of obtaining a good localization of the nodes and biopsy with a negative results, we decided to proceed with mediastinoscopy to further increase the diagnostic value.  The skin of the chest and neck was prepped with Betadine and draped in usual sterile manner.  A second time-out was performed.  We then made a small transverse incision in suprasternal notch and thus dissected down into the pretracheal fascia.  The pretracheal fascia was then bluntly dissected down into the mediastinum.  A video mediastinal scope was then introduced into the mediastinum and the 4R node was easily identified right along the trachea above the takeoff of the right upper lobe bronchus.  Multiple biopsies were obtained of this node. Initial biopsies were sent for frozen and confirmed benign anthracotic lymph nodes.  Even after these initial biopsies, further tissue was obtained and submitted for permanent staining.  After completion of the procedure, there was no evidence of confirmed malignancy from the node. The remaining tissue was sent for permanent exam . With operative field hemostatic, sponge and needle count reported as correct.  The scope was removed, and deep cervical fascia was closed with interrupted 3-0  Vicryl, running 2-0 Vicryl in subcutaneous tissue, and a 4-0 subcuticular stitch in skin edges.  Dermabond was applied.  The patient was awakened in the operating room and transferred to the recovery room in stable condition. Blood loss was minimal.     Lanelle Bal, MD   ______________________________ Lanelle Bal, MD    EG/MEDQ  D:  05/19/2017  T:  05/19/2017  Job:  [id number]

## 2017-05-21 ENCOUNTER — Telehealth: Payer: Self-pay | Admitting: Cardiothoracic Surgery

## 2017-05-21 NOTE — Telephone Encounter (Signed)
Patient called and made aware of the positive node 4r for adeno carcinoma  Report forwarded to Dr Tasia Catchings Patient has follow up appointment with DR YU in [place]. Dakota Isaac MD      [address] [place] [address] Office [phone]   [place]

## 2017-05-24 ENCOUNTER — Ambulatory Visit: Payer: Medicare Other

## 2017-06-06 ENCOUNTER — Other Ambulatory Visit: Payer: Self-pay

## 2017-06-06 ENCOUNTER — Encounter: Payer: Self-pay | Admitting: Nurse Practitioner

## 2017-06-06 ENCOUNTER — Telehealth: Payer: Self-pay

## 2017-06-06 ENCOUNTER — Ambulatory Visit (INDEPENDENT_AMBULATORY_CARE_PROVIDER_SITE_OTHER): Payer: Medicare Other | Admitting: Nurse Practitioner

## 2017-06-06 ENCOUNTER — Other Ambulatory Visit (HOSPITAL_COMMUNITY)
Admission: RE | Admit: 2017-06-06 | Discharge: 2017-06-06 | Disposition: A | Discharge status: Home / Self Care | Payer: Medicare Other | Source: Ambulatory Visit | Attending: Internal Medicine | Admitting: Internal Medicine

## 2017-06-06 DIAGNOSIS — R1310 Dysphagia, unspecified: Secondary | ICD-10-CM

## 2017-06-06 DIAGNOSIS — C3411 Malignant neoplasm of upper lobe, right bronchus or lung: Secondary | ICD-10-CM | POA: Diagnosis present

## 2017-06-06 NOTE — Telephone Encounter (Signed)
Dakota Shannon and spoke to [place]. Informed her that pt is scheduled for EGD/+/-DIL 06/08/17 at 2:30pm with [facility]. She said she would let Dr. Lonia Chimera know.

## 2017-06-06 NOTE — Progress Notes (Signed)
cc'ed to pcp °

## 2017-06-06 NOTE — Assessment & Plan Note (Signed)
Patient with long-standing solid food dysphagia. No pill dysphagia. Barium pill esophagram was completed which showed possible stricture with GE junction. The patient is diagnosed with lung cancer with metastasis to the brain and he is scheduled to start chemotherapy and radiation on August 7. His cancer treatment team would like an upper endoscopy with dilation to help resolve any swallowing/nutritional intake issues prior to starting therapy. Per notes it states she is not amendable to PEG. However, upon further discussion he states he would be okay with a PEG tube if needed as a temporary solution but does not want to have one for an indefinite amount of time/the rest of his life. I feel it is in his best interest to get his dilation accomplished as soon as possible to not cause any delay with his cancer treatment.  Proceed with EGD with Dr. Oneida Alar in near future: the risks, benefits, and alternatives have been discussed with the patient in detail. The patient states understanding and desires to proceed.  The patient is not on any anticoagulants, anxiolytics, chronic pain medications, or antidepressants. Technically tramadol is on his med list but he states he has not even filled this yet and has no intention of taking it. Conscious sedation should be adequate for his procedure.

## 2017-06-06 NOTE — Progress Notes (Addendum)
REVIEWED-NO ADDITIONAL RECOMMENDATIONS.  Primary Care Physician:  [name], MD Primary Gastroenterologist:  Dr. Oneida Alar  Chief Complaint  Patient presents with  . Dysphagia    HPI:   Dakota Shannon is a 82 y.o. male who presents For evaluation of dysphagia on referral from primary care. PCP notes reviewed which dates "barium swallow study completed through Dr. Deeann Saint office that showed an issue at the GE junction. Patient currently being treated for lung cancer [facility] and we'll received both radiation and chemotherapy. The patient has refused a G-tube and they're requesting possible GE junction dilation or other intervention in preparation for concurrent chemotherapy and radiation therapy which is likely to begin the week of August 7, if possible."  Today he states he's doing ok overall. Has had solid food dyspagia which has been going on over a year. Denies pill dysphagia. Occasional regurgitation, worse more recently. Had a Barium swallow and states he could feel the barium tablet get hung up. Seldom heartburn symptoms, likely diet related and passes with TUMS. Denies abdominal pain, N/V, hematochezia, melena. He has lung cancer with METS to brain. Radiation/chemo is scheduled to begin next week. He is amendable to feeding tube if they feel it will likely be temporary. Has had some dyspnea since surgical resection of his lung in 01/2015 also for lung cancer. Denies chest pain, dizziness, lightheadedness, syncope, near syncope. Denies any other upper or lower GI symptoms.  States he has Tramadol prescribed, but hasn't had it filled so not taking it.  Past Medical History:  Diagnosis Date  . Adenocarcinoma of lung [facility])    Resected March 2016  . Arthritis   . Atrial fibrillation ([place])    Possible history based on limited information  . Complication of anesthesia    difficulty urinating  . COPD (chronic obstructive pulmonary disease) ([place])   . Diverticulosis   . Essential  hypertension, benign   . Hodgkin's lymphoma ([place])   . Mixed hyperlipidemia   . Swallowing difficulty   . Wolff-Parkinson-White (WPW) syndrome    Possible history based on limited information    Past Surgical History:  Procedure Laterality Date  . CATARACT EXTRACTION    . COLONOSCOPY    . MEDIASTINOSCOPY N/A 05/18/2017   Procedure: MEDIASTINOSCOPY;  Surgeon: Grace Isaac, MD;  Location: [place];  Service: Thoracic;  Laterality: N/A;  . NECK LESION BIOPSY    . SKIN CANCER EXCISION    . VIDEO ASSISTED THORACOSCOPY (VATS)/WEDGE RESECTION Right 01/26/2015   Procedure: RIGHT VIDEO ASSISTED THORACOSCOPY (VATS)WEDGE RESECTION OF RIGHT UPPER LOBE, LYMPH NODE DISSECTION;  Surgeon: Grace Isaac, MD;  Location: [place];  Service: Thoracic;  Laterality: Right;  Marland Kitchen VIDEO BRONCHOSCOPY N/A 01/26/2015   Procedure: VIDEO BRONCHOSCOPY;  Surgeon: Grace Isaac, MD;  Location: [place];  Service: Thoracic;  Laterality: N/A;  . VIDEO BRONCHOSCOPY WITH ENDOBRONCHIAL ULTRASOUND N/A 05/18/2017   Procedure: VIDEO BRONCHOSCOPY WITH ENDOBRONCHIAL ULTRASOUND;  Surgeon: Grace Isaac, MD;  Location: [place];  Service: Thoracic;  Laterality: N/A;    Current Outpatient Prescriptions  Medication Sig Dispense Refill  . aspirin EC 81 MG tablet Take 81 mg by mouth daily.      . digoxin (LANOXIN) 0.125 MG tablet Take 0.125 mg by mouth daily.    Marland Kitchen gemfibrozil (LOPID) 600 MG tablet Take 600 mg by mouth 2 (two) times daily before a meal.      . isosorbide mononitrate (IMDUR) 30 MG 24 hr tablet TAKE 1 TABLET  BY MOUTH EVERY DAY (Patient taking differently: TAKE 1 TABLET BY MOUTH DAILY AT BEDTIME.) 30 tablet 6  . lisinopril (PRINIVIL,ZESTRIL) 20 MG tablet Take 10-20 mg by mouth 2 (two) times daily. Take one tablet by mouth every morning & 1/2 tablet every evening     . Multiple Vitamin (MULTIVITAMIN WITH MINERALS) TABS tablet Take 1 tablet by mouth daily. Centrum Silver    . propranolol (INDERAL) 80 MG tablet Take 80 mg  by mouth 2 (two) times daily.      . traMADol (ULTRAM) 50 MG tablet Take 1 tablet (50 mg total) by mouth every 6 (six) hours as needed. 15 tablet 0   No current facility-administered medications for this visit.     Allergies as of 06/06/2017 - Review Complete 06/06/2017  Allergen Reaction Noted  . No known allergies  05/17/2017    Family History  Problem Relation Age of Onset  . Diabetes Mother   . Lung cancer Father   . Diabetes Brother   . Lung cancer Brother   . Gastric cancer Maternal Grandfather   . Lung disease Neg Hx   . Colon cancer Neg Hx   . Esophageal cancer Neg Hx     Social History   Social History  . Marital status: Widowed    Spouse name: N/A  . Number of children: N/A  . Years of education: N/A   Occupational History  . Retired    Social History Main Topics  . Smoking status: Former Smoker    Packs/day: 0.50    Years: 33.00    Types: Cigarettes    Start date: 02/27/1947    Quit date: 11/07/1979  . Smokeless tobacco: Never Used  . Alcohol use No  . Drug use: No  . Sexual activity: Not on file   Other Topics Concern  . Not on file   Social History Narrative   [facility] (05/04/17):   Originally from [place]. He served in [employer]. He was in [employer] in [place]. He was in [place], [place], & [place]. 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 Has a dog and cats at home. No bird exposure. No mold exposure. Questionable exposure to asbestos.     Review of Systems: Complete ROS negative except as per HPI.    Physical Exam: BP (!) 157/79   Pulse 60   Temp (!) 96.8 F (36 C) (Oral)   Ht 5\' 11"  (1.803 m)   Wt 259 lb 3.2 oz (117.6 kg)   BMI 36.15 kg/m  General:   Obese male. Alert and oriented. Pleasant and cooperative. Well-nourished and well-developed.  Head:  Normocephalic and atraumatic. Eyes:  Without icterus, sclera clear and conjunctiva pink.  Ears:   Normal auditory acuity. Cardiovascular:  S1, S2 present with systolic murmur appreciated. Extremities without clubbing or edema. Respiratory:  Clear to auscultation bilaterally. No wheezes, rales, or rhonchi. No distress.  Gastrointestinal:  +BS, rounded but soft, non-tender and non-distended. No HSM noted. No guarding or rebound. No masses appreciated.  Rectal:  Deferred  Musculoskalatal:  Symmetrical without gross deformities. Neurologic:  Alert and oriented x4;  grossly normal neurologically. Psych:  Alert and cooperative. Normal mood and affect. Heme/Lymph/Immune: No excessive bruising noted.    06/06/2017 2:24 PM   Disclaimer: This note was dictated with voice recognition software. Similar sounding words can inadvertently be transcribed and may not be corrected upon review.

## 2017-06-06 NOTE — Patient Instructions (Signed)
1. We will schedule your procedure for you. 2. Further recommendations to be made based on the results of your procedure. 3. Return for follow-up based on these recommendations. 4. Feel free to call us if there is anything else we can help you with. 5. Best of luck to you!!!!!!!

## 2017-06-07 ENCOUNTER — Encounter: Payer: Self-pay | Admitting: Radiation Oncology

## 2017-06-08 ENCOUNTER — Other Ambulatory Visit: Payer: Self-pay | Admitting: Radiation Therapy

## 2017-06-08 ENCOUNTER — Encounter (HOSPITAL_COMMUNITY): Payer: Self-pay

## 2017-06-08 ENCOUNTER — Ambulatory Visit (HOSPITAL_COMMUNITY)
Admission: RE | Admit: 2017-06-08 | Discharge: 2017-06-08 | Disposition: A | Discharge status: Home / Self Care | Payer: Medicare Other | Source: Ambulatory Visit | Attending: Gastroenterology | Admitting: Gastroenterology

## 2017-06-08 ENCOUNTER — Encounter (HOSPITAL_COMMUNITY)
Admission: RE | Disposition: A | Discharge status: Home / Self Care | Payer: Self-pay | Source: Ambulatory Visit | Attending: Gastroenterology

## 2017-06-08 ENCOUNTER — Encounter: Payer: Self-pay | Admitting: Radiation Oncology

## 2017-06-08 DIAGNOSIS — Z87891 Personal history of nicotine dependence: Secondary | ICD-10-CM | POA: Insufficient documentation

## 2017-06-08 DIAGNOSIS — C7931 Secondary malignant neoplasm of brain: Secondary | ICD-10-CM

## 2017-06-08 DIAGNOSIS — K2960 Other gastritis without bleeding: Secondary | ICD-10-CM | POA: Diagnosis not present

## 2017-06-08 DIAGNOSIS — K317 Polyp of stomach and duodenum: Secondary | ICD-10-CM | POA: Insufficient documentation

## 2017-06-08 DIAGNOSIS — K222 Esophageal obstruction: Secondary | ICD-10-CM

## 2017-06-08 DIAGNOSIS — Z85118 Personal history of other malignant neoplasm of bronchus and lung: Secondary | ICD-10-CM | POA: Insufficient documentation

## 2017-06-08 DIAGNOSIS — Z8571 Personal history of Hodgkin lymphoma: Secondary | ICD-10-CM | POA: Diagnosis not present

## 2017-06-08 DIAGNOSIS — I456 Pre-excitation syndrome: Secondary | ICD-10-CM | POA: Diagnosis not present

## 2017-06-08 DIAGNOSIS — Z79899 Other long term (current) drug therapy: Secondary | ICD-10-CM | POA: Insufficient documentation

## 2017-06-08 DIAGNOSIS — I10 Essential (primary) hypertension: Secondary | ICD-10-CM | POA: Diagnosis not present

## 2017-06-08 DIAGNOSIS — J449 Chronic obstructive pulmonary disease, unspecified: Secondary | ICD-10-CM | POA: Diagnosis not present

## 2017-06-08 DIAGNOSIS — R1310 Dysphagia, unspecified: Secondary | ICD-10-CM | POA: Diagnosis present

## 2017-06-08 DIAGNOSIS — E782 Mixed hyperlipidemia: Secondary | ICD-10-CM | POA: Insufficient documentation

## 2017-06-08 DIAGNOSIS — T39395A Adverse effect of other nonsteroidal anti-inflammatory drugs [NSAID], initial encounter: Secondary | ICD-10-CM

## 2017-06-08 DIAGNOSIS — Z7982 Long term (current) use of aspirin: Secondary | ICD-10-CM | POA: Diagnosis not present

## 2017-06-08 DIAGNOSIS — C7949 Secondary malignant neoplasm of other parts of nervous system: Principal | ICD-10-CM

## 2017-06-08 DIAGNOSIS — Z85828 Personal history of other malignant neoplasm of skin: Secondary | ICD-10-CM | POA: Diagnosis not present

## 2017-06-08 HISTORY — PX: SAVORY DILATION: SHX5439

## 2017-06-08 HISTORY — PX: ESOPHAGOGASTRODUODENOSCOPY: SHX5428

## 2017-06-08 SURGERY — EGD (ESOPHAGOGASTRODUODENOSCOPY)
Anesthesia: Moderate Sedation

## 2017-06-08 MED ORDER — MINERAL OIL PO OIL
TOPICAL_OIL | ORAL | Status: AC
  Filled 2017-06-08: qty 30

## 2017-06-08 MED ORDER — OMEPRAZOLE 20 MG PO CPDR: DELAYED_RELEASE_CAPSULE | ORAL | 3 refills | Status: AC

## 2017-06-08 MED ORDER — LIDOCAINE VISCOUS 2 % MT SOLN
OROMUCOSAL | Status: AC
  Filled 2017-06-08: qty 15

## 2017-06-08 MED ORDER — MIDAZOLAM HCL 5 MG/5ML IJ SOLN
INTRAMUSCULAR | Status: AC
  Filled 2017-06-08: qty 10

## 2017-06-08 MED ORDER — STERILE WATER FOR IRRIGATION IR SOLN
Status: DC | PRN
  Administered 2017-06-08: 100 mL

## 2017-06-08 MED ORDER — MIDAZOLAM HCL 5 MG/5ML IJ SOLN
INTRAMUSCULAR | Status: DC | PRN
  Administered 2017-06-08 (×2): 2 mg via INTRAVENOUS

## 2017-06-08 MED ORDER — MEPERIDINE HCL 100 MG/ML IJ SOLN
INTRAMUSCULAR | Status: AC
  Filled 2017-06-08: qty 2

## 2017-06-08 MED ORDER — LIDOCAINE VISCOUS 2 % MT SOLN
OROMUCOSAL | Status: DC | PRN
  Administered 2017-06-08: 1 via OROMUCOSAL

## 2017-06-08 MED ORDER — MEPERIDINE HCL 100 MG/ML IJ SOLN
INTRAMUSCULAR | Status: DC | PRN
  Administered 2017-06-08 (×2): 25 mg via INTRAVENOUS

## 2017-06-08 MED ORDER — SODIUM CHLORIDE 0.9 % IV SOLN
INTRAVENOUS | Status: DC
  Administered 2017-06-08: 14:00:00 via INTRAVENOUS

## 2017-06-08 NOTE — H&P (Signed)
Primary Care Physician:  [name], MD Primary Gastroenterologist:  Dr. Oneida Alar  Pre-Procedure History & Physical: HPI:  Dakota Shannon is a 82 y.o. male here for DYSPHAGIA.  Past Medical History:  Diagnosis Date  . Adenocarcinoma of lung [facility])    Resected March 2016  . Arthritis   . Atrial fibrillation ([place])    Possible history based on limited information  . Complication of anesthesia    difficulty urinating  . COPD (chronic obstructive pulmonary disease) ([place])   . Diverticulosis   . Essential hypertension, benign   . Hodgkin's lymphoma ([place])   . Mixed hyperlipidemia   . Swallowing difficulty   . Wolff-Parkinson-White (WPW) syndrome    Possible history based on limited information    Past Surgical History:  Procedure Laterality Date  . CATARACT EXTRACTION    . COLONOSCOPY    . MEDIASTINOSCOPY N/A 05/18/2017   Procedure: MEDIASTINOSCOPY;  Surgeon: Grace Isaac, MD;  Location: [place];  Service: Thoracic;  Laterality: N/A;  . NECK LESION BIOPSY    . SKIN CANCER EXCISION    . VIDEO ASSISTED THORACOSCOPY (VATS)/WEDGE RESECTION Right 01/26/2015   Procedure: RIGHT VIDEO ASSISTED THORACOSCOPY (VATS)WEDGE RESECTION OF RIGHT UPPER LOBE, LYMPH NODE DISSECTION;  Surgeon: Grace Isaac, MD;  Location: [place];  Service: Thoracic;  Laterality: Right;  Marland Kitchen VIDEO BRONCHOSCOPY N/A 01/26/2015   Procedure: VIDEO BRONCHOSCOPY;  Surgeon: Grace Isaac, MD;  Location: [place];  Service: Thoracic;  Laterality: N/A;  . VIDEO BRONCHOSCOPY WITH ENDOBRONCHIAL ULTRASOUND N/A 05/18/2017   Procedure: VIDEO BRONCHOSCOPY WITH ENDOBRONCHIAL ULTRASOUND;  Surgeon: Grace Isaac, MD;  Location: [place];  Service: Thoracic;  Laterality: N/A;    Prior to Admission medications   Medication Sig Start Date End Date Taking? Authorizing Provider  aspirin EC 81 MG tablet Take 81 mg by mouth daily.     Yes [provider]  digoxin (LANOXIN) 0.125 MG tablet Take 0.125 mg by mouth daily.   Yes  [provider]  gemfibrozil (LOPID) 600 MG tablet Take 600 mg by mouth 2 (two) times daily before a meal.     Yes [provider]  isosorbide mononitrate (IMDUR) 30 MG 24 hr tablet TAKE 1 TABLET BY MOUTH EVERY DAY Patient taking differently: TAKE 1 TABLET BY MOUTH DAILY AT BEDTIME. 05/03/17  Yes Satira Sark, MD  lisinopril (PRINIVIL,ZESTRIL) 20 MG tablet Take 10-20 mg by mouth 2 (two) times daily. Take one tablet by mouth every morning & 1/2 tablet every evening  03/18/12  Yes Satira Sark, MD  Multiple Vitamin (MULTIVITAMIN WITH MINERALS) TABS tablet Take 1 tablet by mouth daily. Centrum Silver   Yes [provider]  propranolol (INDERAL) 80 MG tablet Take 80 mg by mouth 2 (two) times daily.     Yes [provider]  traMADol (ULTRAM) 50 MG tablet Take 1 tablet (50 mg total) by mouth every 6 (six) hours as needed. 05/18/17 05/18/18 Yes Grace Isaac, MD    Allergies as of 06/06/2017 - Review Complete 06/06/2017  Allergen Reaction Noted  . No known allergies  05/17/2017    Family History  Problem Relation Age of Onset  . Diabetes Mother   . Lung cancer Father   . Diabetes Brother   . Lung cancer Brother   . Gastric cancer Maternal Grandfather   . Lung disease Neg Hx   . Colon cancer Neg Hx   . Esophageal cancer Neg Hx     Social History  Social History  . Marital status: Widowed    Spouse name: N/A  . Number of children: N/A  . Years of education: N/A   Occupational History  . Retired    Social History Main Topics  . Smoking status: Former Smoker    Packs/day: 0.50    Years: 33.00    Types: Cigarettes    Start date: 02/27/1947    Quit date: 11/07/1979  . Smokeless tobacco: Never Used  . Alcohol use No  . Drug use: No  . Sexual activity: Not on file   Other Topics Concern  . Not on file   Social History Narrative   [facility] (05/04/17):   Originally from [place]. He served in [employer]. He was in [employer]  in [place]. He was in [place], & [place]. 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 Has a dog and cats at home. No bird exposure. No mold exposure. Questionable exposure to asbestos.     Review of Systems: See HPI, otherwise negative ROS   Physical Exam: BP (!) 139/92   Pulse (!) 51   Temp 98.6 F (37 C) (Oral)   Resp 12   SpO2 96%  General:   Alert,  pleasant and cooperative in NAD Head:  Normocephalic and atraumatic. Neck:  Supple; Lungs:  Clear throughout to auscultation.    Heart:  Regular rate and rhythm. Abdomen:  Soft, nontender and nondistended. Normal bowel sounds, without guarding, and without rebound.   Neurologic:  Alert and  oriented x4;  grossly normal neurologically.  Impression/Plan:     DYSPHAGIA  PLAN:  EGD/DIL TODAY. DISCUSSED PROCEDURE, BENEFITS, & RISKS: < 1% chance of medication reaction, bleeding, OR perforation.

## 2017-06-08 NOTE — Op Note (Addendum)
[facility] Patient Name: Dakota Shannon Procedure Date: 06/08/2017 2:44 PM MRN: [id number] Date of Birth: 1934-11-10 Attending MD: Barney Drain , MD CSN: [id number] Age: 82 Admit Type: Outpatient Procedure:                Upper GI endoscopy WITH COLD FORCEPS                            BIOPSY/ESOPHAGEAL DILATION Indications:              Dysphagia-BRAIN MRI SCHEDULED FOR AUG 8. Providers:                Barney Drain, MD, Lurline Del, RN, Charlyne Petrin                            RN, RN Referring MD:             [name] Medicines:                Meperidine 50 mg IV, Midazolam 4 mg IV Complications:            No immediate complications. Estimated Blood Loss:     Estimated blood loss was minimal. Procedure:                Pre-Anesthesia Assessment:                           - Prior to the procedure, a History and Physical                            was performed, and patient medications and                            allergies were reviewed. The patient's tolerance of                            previous anesthesia was also reviewed. The risks                            and benefits of the procedure and the sedation                            options and risks were discussed with the patient.                            All questions were answered, and informed consent                            was obtained. Prior Anticoagulants: The patient has                            taken aspirin, last dose was 1 week prior to                            procedure. ASA Grade Assessment: II - A patient  with mild systemic disease. After reviewing the                            risks and benefits, the patient was deemed in                            satisfactory condition to undergo the procedure.                            After obtaining informed consent, the endoscope was                            passed under direct vision. Throughout the         procedure, the patient's blood pressure, pulse, and                            oxygen saturations were monitored continuously. The                            EG-[id number] ([id number]) scope was introduced through the                            mouth, and advanced to the second part of duodenum.                            The upper GI endoscopy was somewhat difficult due                            to excessive bleeding. Successful completion of the                            procedure was aided by performing the maneuvers                            documented (below) in this report. The patient                            tolerated the procedure well. Scope In: 3:11:55 PM Scope Out: 3:31:33 PM Total Procedure Duration: 0 hours 19 minutes 38 seconds  Findings:      One moderate (circumferential scarring or stenosis; an endoscope may       pass) benign-appearing, intrinsic stenosis was found. This measured 1.3       cm (inner diameter) and was traversed. A guidewire was placed and the       scope was withdrawn. Dilation was performed with a Savary dilator with       no resistance at 12.8 mm and 14 mm and mild resistance at 15 mm, 16 mm       and 17 mm.      Patchy mild inflammation characterized by congestion (edema), erosions       and erythema was found in the gastric antrum. Biopsies were taken with a       cold forceps for Helicobacter pylori testing.      The duodenal bulb was normal.  A single 10 mm submucosal nodule WITH PILLOW LIKE TEXTURE was found in       the second portion of the duodenum. This was biopsied with a cold       forceps for histology. ACTIVE BLEEDING AFTER ONE BIOPSY To prevent       bleeding post-intervention, one hemostatic clip was successfully placed       (MR conditional). There was no bleeding at the end of the procedure.      A single 6 mm sessile polyp was found in the second portion of the       duodenum. IT WAS NOT REMOVED. Impression:               -  DYSPHAGIA DUE TO PEPTIC STRICTURE                           - MILD EROSIVE GASTRITIS                           - Submucosal CYSTTIC nodule found in the duodenum.                            Clip x1 (MR conditional) was placed.                           - A single duodenal polyp REMAINS Moderate Sedation:      Moderate (conscious) sedation was administered by the endoscopy nurse       and supervised by the endoscopist. The following parameters were       monitored: oxygen saturation, heart rate, blood pressure, and response       to care. Total physician intraservice time was 29 minutes. Recommendation:           - Resume previous diet. OK TO PROCEED WITH MRI.                            [place]. PT AND DAUGHTER ARE AWARE.                           - Continue present medications.                           - Await pathology results.                           - Return to GI office in 4 months.                           - Patient has a contact number available for                            emergencies. The signs and symptoms of potential                            delayed complications were discussed with the                            patient.  Return to normal activities tomorrow.                            Written discharge instructions were provided to the                            patient. Procedure Code(s):        --- Professional ---                           [phone], Esophagogastroduodenoscopy, flexible,                            transoral; with insertion of guide wire followed by                            passage of dilator(s) through esophagus over guide                            wire                           [id number], Esophagogastroduodenoscopy, flexible,                            transoral; with biopsy, single or multiple                           99152, Moderate sedation services provided by the                            same physician or other qualified health  care                            professional performing the diagnostic or                            therapeutic service that the sedation supports,                            requiring the presence of an independent trained                            observer to assist in the monitoring of the                            patient's level of consciousness and physiological                            status; initial 15 minutes of intraservice time,                            patient age 49 years or older                           [phone], Moderate sedation services; each  additional                            15 minutes intraservice time Diagnosis Code(s):        --- Professional ---                           K22.2, Esophageal obstruction                           K29.70, Gastritis, unspecified, without bleeding                           K31.89, Other diseases of stomach and duodenum                           K31.7, Polyp of stomach and duodenum                           R13.10, Dysphagia, unspecified CPT copyright 2016 American Medical Association. All rights reserved. The codes documented in this report are preliminary and upon coder review may  be revised to meet current compliance requirements. Barney Drain, MD Barney Drain, MD 06/08/2017 3:47:50 PM This report has been signed electronically. Number of Addenda: 0

## 2017-06-08 NOTE — Progress Notes (Signed)
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  Past or anticipated interventions, if any, per neurosurgery: Will see Dr. Jovita Gamma 06-19-17  Past or anticipated interventions, if any, per medical oncology: Dr. Corrie Dandy 06-06-17 MRI brain, 05-03-17 PET CT , 05-18 CT chest  Dose of Decadron, if applicable: N/A  Recent neurologic symptoms, if any:   Seizures: No  Headaches: No  Nausea: No  Dizziness/ataxia: No  Difficulty with hand coordination: No  Focal numbness/weakness: No  Visual deficits/changes: No  Confusion/Memory deficits:No  Painful bone metastases at present, if any: No  SAFETY ISSUES:  Prior radiation? :02-2015  LP-HL and right sided [id number] NSCLCA,   Pacemaker/ICD? : No  Possible current pregnancy? : No  Is the patient on methotrexate? : No  Additional Complaints / other details: 82 y.o. widowed male with recurrent right sided NSCLCA with single brain metastasis with a history of hodgkin lymphoma. n Wt Readings from Last 3 Encounters:  06/13/17 259 lb 6.4 oz (117.7 kg)  06/06/17 259 lb 3.2 oz (117.6 kg)  05/14/17 261 lb (118.4 kg)  Temp 97.8 F (36.6 C) (Oral)   Resp 20   Ht 5\' 11"  (1.803 m)   Wt 259 lb 6.4 oz (117.7 kg)   BMI 36.18 kg/m

## 2017-06-08 NOTE — Discharge Instructions (Signed)
I dilated your esophagus. You have a stricture near the base of your esophagus.  You have mild gastritis & A SMALL BOWEL POLYPS(2). I PLACED A CLIP TO PREVENT BLEEDING.  I biopsied your stomach AND SMALL BOWL.    NO MRI FOR 30 DAYS DUE TO METAL CLIP PLACEMENT IN THE SMALL BOWEL.  START OMEPRAZOLE.  TAKE 30 MINUTES PRIOR TO YOUR FIRST MEAL.  YOUR BIOPSY RESULTS WILL BE AVAILABLE IN MY CHART AFTER AUG 6 AND MY OFFICE WILL CONTACT YOU IN 10-14 DAYS WITH YOUR RESULTS.   FOLLOW UP IN 4 MOS.  UPPER ENDOSCOPY AFTER CARE Read the instructions outlined below and refer to this sheet in the next week. These discharge instructions provide you with general information on caring for yourself after you leave the hospital. While your treatment has been planned according to the most current medical practices available, unavoidable complications occasionally occur. If you have any problems or questions after discharge, call DR. Diona Peregoy, [phone].  ACTIVITY  You may resume your regular activity, but move at a slower pace for the next 24 hours.   Take frequent rest periods for the next 24 hours.   Walking will help get rid of the air and reduce the bloated feeling in your belly (abdomen).   No driving for 24 hours (because of the medicine (anesthesia) used during the test).   You may shower.   Do not sign any important legal documents or operate any machinery for 24 hours (because of the anesthesia used during the test).    NUTRITION  Drink plenty of fluids.   You may resume your normal diet as instructed by your doctor.   Begin with a light meal and progress to your normal diet. Heavy or fried foods are harder to digest and may make you feel sick to your stomach (nauseated).   Avoid alcoholic beverages for 24 hours or as instructed.    MEDICATIONS  You may resume your normal medications.   WHAT YOU CAN EXPECT TODAY  Some feelings of bloating in the abdomen.   Passage of more gas  than usual.    IF YOU HAD A BIOPSY TAKEN DURING THE UPPER ENDOSCOPY:  Eat a soft diet IF YOU HAVE NAUSEA, BLOATING, ABDOMINAL PAIN, OR VOMITING.    FINDING OUT THE RESULTS OF YOUR TEST Not all test results are available during your visit. DR. Oneida Alar WILL CALL YOU WITHIN 14 DAYS OF YOUR PROCEDUE WITH YOUR RESULTS. Do not assume everything is normal if you have not heard from DR. Geza Beranek, CALL HER OFFICE AT [phone].  SEEK IMMEDIATE MEDICAL ATTENTION AND CALL THE OFFICE: [phone] IF:  You have more than a spotting of blood in your stool.   Your belly is swollen (abdominal distention).   You are nauseated or vomiting.   You have a temperature over 101F.   You have abdominal pain or discomfort that is severe or gets worse throughout the day.  Gastritis  Gastritis is an inflammation (the body's way of reacting to injury and/or infection) of the stomach. It is often caused by viral or bacterial (germ) infections. It can also be caused BY ASPIRIN, BC/GOODY POWDER'S, (IBUPROFEN) MOTRIN, OR ALEVE (NAPROXEN), chemicals (including alcohol), SPICY FOODS, and medications. This illness may be associated with generalized malaise (feeling tired, not well), UPPER ABDOMINAL STOMACH cramps, and fever. One common bacterial cause of gastritis is an organism known as H. Pylori. This can be treated with antibiotics.   ESOPHAGEAL STRICTURE  Esophageal strictures can be caused  by stomach acid backing up into the tube that carries food from the mouth down to the stomach (lower esophagus).  TREATMENT There are a number of medicines used to treat reflux/stricture, including: Antacids.  Proton-pump inhibitors: OMEPRAZOLE  HOME CARE INSTRUCTIONS Eat 2-3 hours before going to bed.  Try to reach and maintain a healthy weight.  Do not eat just a few very large meals. Instead, eat 4 TO 6 smaller meals throughout the day.  Try to identify foods and beverages that make your symptoms worse, and avoid these.   Avoid tight clothing.  Do not exercise right after eating.

## 2017-06-11 ENCOUNTER — Ambulatory Visit: Payer: Medicare Other | Admitting: Radiation Oncology

## 2017-06-11 ENCOUNTER — Ambulatory Visit: Payer: Medicare Other

## 2017-06-11 ENCOUNTER — Encounter: Payer: Self-pay | Admitting: Radiation Oncology

## 2017-06-12 DIAGNOSIS — C3490 Malignant neoplasm of unspecified part of unspecified bronchus or lung: Secondary | ICD-10-CM | POA: Insufficient documentation

## 2017-06-12 DIAGNOSIS — C7931 Secondary malignant neoplasm of brain: Secondary | ICD-10-CM

## 2017-06-13 ENCOUNTER — Ambulatory Visit
Admission: RE | Admit: 2017-06-13 | Discharge: 2017-06-13 | Disposition: A | Discharge status: Home / Self Care | Payer: Medicare Other | Source: Ambulatory Visit | Attending: Radiation Oncology | Admitting: Radiation Oncology

## 2017-06-13 ENCOUNTER — Encounter (HOSPITAL_COMMUNITY): Payer: Self-pay | Admitting: Gastroenterology

## 2017-06-13 ENCOUNTER — Institutional Professional Consult (permissible substitution): Payer: Medicare Other | Admitting: Radiation Oncology

## 2017-06-13 ENCOUNTER — Encounter: Payer: Self-pay | Admitting: Gastroenterology

## 2017-06-13 ENCOUNTER — Ambulatory Visit
Admission: RE | Admit: 2017-06-13 | Discharge: 2017-06-13 | Disposition: A | Discharge status: Home / Self Care | Payer: Medicare Other | Source: Ambulatory Visit | Attending: Urology | Admitting: Urology

## 2017-06-13 DIAGNOSIS — Z87891 Personal history of nicotine dependence: Secondary | ICD-10-CM | POA: Insufficient documentation

## 2017-06-13 DIAGNOSIS — C7931 Secondary malignant neoplasm of brain: Secondary | ICD-10-CM | POA: Insufficient documentation

## 2017-06-13 DIAGNOSIS — E782 Mixed hyperlipidemia: Secondary | ICD-10-CM | POA: Insufficient documentation

## 2017-06-13 DIAGNOSIS — Z833 Family history of diabetes mellitus: Secondary | ICD-10-CM | POA: Diagnosis not present

## 2017-06-13 DIAGNOSIS — Z801 Family history of malignant neoplasm of trachea, bronchus and lung: Secondary | ICD-10-CM | POA: Insufficient documentation

## 2017-06-13 DIAGNOSIS — Z923 Personal history of irradiation: Secondary | ICD-10-CM | POA: Insufficient documentation

## 2017-06-13 DIAGNOSIS — Z510 Encounter for antineoplastic radiation therapy: Secondary | ICD-10-CM | POA: Diagnosis present

## 2017-06-13 DIAGNOSIS — C7949 Secondary malignant neoplasm of other parts of nervous system: Principal | ICD-10-CM

## 2017-06-13 DIAGNOSIS — J449 Chronic obstructive pulmonary disease, unspecified: Secondary | ICD-10-CM | POA: Diagnosis not present

## 2017-06-13 DIAGNOSIS — I10 Essential (primary) hypertension: Secondary | ICD-10-CM | POA: Insufficient documentation

## 2017-06-13 DIAGNOSIS — Z85118 Personal history of other malignant neoplasm of bronchus and lung: Secondary | ICD-10-CM | POA: Diagnosis not present

## 2017-06-13 DIAGNOSIS — Z79899 Other long term (current) drug therapy: Secondary | ICD-10-CM | POA: Insufficient documentation

## 2017-06-13 DIAGNOSIS — C3490 Malignant neoplasm of unspecified part of unspecified bronchus or lung: Secondary | ICD-10-CM

## 2017-06-13 DIAGNOSIS — Z8571 Personal history of Hodgkin lymphoma: Secondary | ICD-10-CM | POA: Diagnosis not present

## 2017-06-13 DIAGNOSIS — C771 Secondary and unspecified malignant neoplasm of intrathoracic lymph nodes: Secondary | ICD-10-CM | POA: Diagnosis not present

## 2017-06-13 MED ORDER — GADOBENATE DIMEGLUMINE 529 MG/ML IV SOLN
20.0000 mL | Freq: Once | INTRAVENOUS | Status: AC | PRN
  Administered 2017-06-13: 20 mL via INTRAVENOUS

## 2017-06-13 NOTE — Progress Notes (Signed)
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 Primary malignant neoplasm of lung with metastasis to brain [facility]) C34.90    C79.31     HISTORY OF PRESENT ILLNESS: Dakota Shannon is a 82 y.o. male seen at the request of Dr. Quitman Livings. 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 His history begins in 11/2014 when his dentist noted a right neck node that was not associated with previous biopsy or surgery. He had a CT neck at that time showing a right submandibilar LN and right lung mass. A PET scan was performed for further evaluation and showed increased activity in both sites. He reportedly had excisional biopsy of the right neck node that was positive for LP-HL stage IA. He had RUL wedge resection in 01/2015 with LN evaluation showing a 2.2 cm adenocarcinoma invading into the visceral pleura with 4R, 10R and 11 LN negative for metastatic disease. He was therefore staged IB, pT2N0M0R0 moderately differentiated right lung adenocarcinoma. He received 30 Gy to the right neck with IMRT in [place], [place] for stage IA Lymphocyte predominant-Hodgkin lymphoma. He tolerated his surgery and radiotherapy well and has been followed clinically in [place], [place] since that time.   He was having routine follow up imaging studies and in 03/2017 was found to have a right paratracheal LN measuring 1.4 cm, increased from 9 mm previously. A PET/CT scan performed in 04/2017 confirmed right paratracheal lymphadenpathy  measuring 1.3 cm with SUV = 4.4. There was also an 8 mm right hilar node with SUV=4. He underwent bronchoscopy and EBUS with Dr. Servando Snare on 05/18/17 revealing a 4 R LN + for metastatic adenocarcinoma. He was seen by Dr. Lonia Chimera and Dr. Quitman Livings for pT0N2Mx NSCLC, adenocarcinoma recurrence. Recommendation was for concurrent chemoradiotherapy as definitive therapy given the lack of other metastatic disease and he is planning to complete his treatment in [place], [place].   Dr. Lonia Chimera obtained a contrast enhanced brain MRI on 06/06/17 which showed a single left frontal lobe lesion measuring 45mm, consistent with metastatic disease and with extensive associated vasogenic edema. Mr. Stutz therefore has stage IV, T0N2M1, moderately differentiated adenocarcinoma. He was not started on decadron per patient report.  He remains asymptomatic without complaints of cough, CP or SOB. His weight is stable. He has no HA or bony pain. He denies any fevers or night sweats. He has no change in speech or vision, no focal weakness or numbness. He overall feels well.     He has kindly been referred to [place] to discuss the role for SRS in the management of his solitary brain metastasis.  He is scheduled for a 3T SRS protocol MRI brain today at 12:30pm.    PREVIOUS RADIATION THERAPY: Yes- 4/16: Right neck IMRT to 30 GY in 15 fractions for Lymphocyte Predominant Hodgkin Lymphoma in [place], [place].  PAST MEDICAL HISTORY:  Past Medical History:  Diagnosis Date  . Adenocarcinoma of lung [facility])    Resected March 2016  . Arthritis   .  Atrial fibrillation ([place])    Possible history based on limited information  . Complication of anesthesia    difficulty urinating  . COPD (chronic obstructive pulmonary disease) ([place])   . Diverticulosis   . Essential hypertension, benign   . Hodgkin's lymphoma ([place])   . Mixed hyperlipidemia   . Swallowing difficulty   . Wolff-Parkinson-White (WPW) syndrome    Possible history based on limited  information      PAST SURGICAL HISTORY: Past Surgical History:  Procedure Laterality Date  . CATARACT EXTRACTION    . COLONOSCOPY    . MEDIASTINOSCOPY N/A 05/18/2017   Procedure: MEDIASTINOSCOPY;  Surgeon: Grace Isaac, MD;  Location: [facility];  Service: Thoracic;  Laterality: N/A;  . NECK LESION BIOPSY    . SKIN CANCER EXCISION    . VIDEO ASSISTED THORACOSCOPY (VATS)/WEDGE RESECTION Right 01/26/2015   Procedure: RIGHT VIDEO ASSISTED THORACOSCOPY (VATS)WEDGE RESECTION OF RIGHT UPPER LOBE, LYMPH NODE DISSECTION;  Surgeon: Grace Isaac, MD;  Location: [facility];  Service: Thoracic;  Laterality: Right;  Marland Kitchen VIDEO BRONCHOSCOPY N/A 01/26/2015   Procedure: VIDEO BRONCHOSCOPY;  Surgeon: Grace Isaac, MD;  Location: [facility];  Service: Thoracic;  Laterality: N/A;  . VIDEO BRONCHOSCOPY WITH ENDOBRONCHIAL ULTRASOUND N/A 05/18/2017   Procedure: VIDEO BRONCHOSCOPY WITH ENDOBRONCHIAL ULTRASOUND;  Surgeon: Grace Isaac, MD;  Location: [facility];  Service: Thoracic;  Laterality: N/A;    FAMILY HISTORY:  Family History  Problem Relation Age of Onset  . Diabetes Mother   . Lung cancer Father   . Diabetes Brother   . Lung cancer Brother   . Gastric cancer Maternal Grandfather   . Lung disease Neg Hx   . Colon cancer Neg Hx   . Esophageal cancer Neg Hx     SOCIAL HISTORY:  Social History   Social History  . Marital status: Widowed    Spouse name: N/A  . Number of children: N/A  . Years of education: N/A   Occupational History  . Retired    Social History Main Topics  . Smoking status: Former Smoker    Packs/day: 0.50    Years: 33.00    Types: Cigarettes    Start date: 02/27/1947    Quit date: 11/07/1979  . Smokeless tobacco: Never Used  . Alcohol use No  . Drug use: No  . Sexual activity: Not on file   Other Topics Concern  . Not on file   Social History Narrative   [facility] (05/04/17):   Originally from [place]. He served in [employer]. He was in [employer] in  [place]. He was in [place], [place], & [place]. 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 Has a dog and cats at home. No bird exposure. No mold exposure. Questionable exposure to asbestos.     ALLERGIES: No known allergies  MEDICATIONS:  Current Outpatient Prescriptions  Medication Sig Dispense Refill  . digoxin (LANOXIN) 0.125 MG tablet Take 0.125 mg by mouth daily.    Marland Kitchen gemfibrozil (LOPID) 600 MG tablet Take 600 mg by mouth 2 (two) times daily before a meal.      . isosorbide mononitrate (IMDUR) 30 MG 24 hr tablet TAKE 1 TABLET BY MOUTH EVERY DAY (Patient taking differently: TAKE 1 TABLET BY MOUTH DAILY AT BEDTIME.) 30 tablet 6  . lisinopril (PRINIVIL,ZESTRIL) 20 MG tablet Take 10-20 mg by mouth 2 (two) times daily. Take one tablet by  mouth every morning & 1/2 tablet every evening     . Multiple Vitamin (MULTIVITAMIN WITH MINERALS) TABS tablet Take 1 tablet by mouth daily. Centrum Silver    . propranolol (INDERAL) 80 MG tablet Take 80 mg by mouth 2 (two) times daily.      Marland Kitchen omeprazole (PRILOSEC) 20 MG capsule 1 PO 30 MINS PRIOR TO BREAKFAST. (Patient not taking: Reported on 06/13/2017) 90 capsule 3  . traMADol (ULTRAM) 50 MG tablet Take 1 tablet (50 mg total) by mouth every 6 (six) hours as needed. (Patient not taking: Reported on 06/13/2017) 15 tablet 0   No current facility-administered medications for this encounter.     REVIEW OF SYSTEMS:  On review of systems, the patient reports that he is doing well overall. He denies any chest pain, shortness of breath, cough, fevers, chills, night sweats, unintended weight changes. He denies any bowel or bladder disturbances, and denies abdominal pain, nausea or vomiting. He denies any new musculoskeletal or joint aches or pains. He denies headaches, visual or auditory changes, extremity weakness, or paresthesias. A complete review of systems is obtained and is otherwise  negative.    PHYSICAL EXAM:  Wt Readings from Last 3 Encounters:  06/13/17 259 lb 6.4 oz (117.7 kg)  06/06/17 259 lb 3.2 oz (117.6 kg)  05/14/17 261 lb (118.4 kg)   Temp Readings from Last 3 Encounters:  06/13/17 97.8 F (36.6 C) (Oral)  06/08/17 98 F (36.7 C) (Oral)  06/06/17 (!) 96.8 F (36 C) (Oral)   BP Readings from Last 3 Encounters:  06/08/17 (!) 111/47  06/06/17 (!) 157/79  05/18/17 106/60   Pulse Readings from Last 3 Encounters:  06/08/17 (!) 49  06/06/17 60  05/18/17 68   Pain Assessment Pain Score: 0-No pain/10  In general this is a well appearing caucasian male in no acute distress. He is alert and oriented x4 and appropriate throughout the examination. HEENT reveals that the patient is normocephalic, atraumatic. EOMs are intact. PERRLA. Skin is intact without any evidence of gross lesions. Cardiovascular exam reveals a regular rate and rhythm, no clicks rubs or murmurs are auscultated. Chest is clear to auscultation bilaterally. Lymphatic assessment is performed and does not reveal any adenopathy in the cervical, supraclavicular, axillary, or inguinal chains. Abdomen has active bowel sounds in all quadrants and is intact. The abdomen is soft, non tender, non distended. Lower extremities are negative for pretibial pitting edema, deep calf tenderness, cyanosis or clubbing.  Strength is 5/5 and equal bilaterally in the upper and lowe extremities.  Sensation is intact to light touch and equal bilaterally in the upper and lower extremities.   KPS = 90  100 - Normal; no complaints; no evidence of disease. 90   - Able to carry on normal activity; minor signs or symptoms of disease. 80   - Normal activity with effort; some signs or symptoms of disease. 67   - Cares for self; unable to carry on normal activity or to do active work. 60   - Requires occasional assistance, but is able to care for most of his personal needs. 50   - Requires considerable assistance and  frequent medical care. 58   - Disabled; requires special care and assistance. 51   - Severely disabled; hospital admission is indicated although death not imminent. 37   - Very sick; hospital admission necessary; active supportive treatment necessary. 10   - Moribund; fatal processes progressing rapidly. 0     - Dead  Karnofsky  DA, [place], Craver LS and Burchenal JH [phone]) The use of the nitrogen mustards in the palliative treatment of carcinoma: with particular reference to bronchogenic carcinoma Cancer 1 634-56  LABORATORY DATA:  Lab Results  Component Value Date   WBC 4.7 05/18/2017   HGB 12.9 (L) 05/18/2017   HCT 40.4 05/18/2017   MCV 90.2 05/18/2017   PLT 195 05/18/2017   Lab Results  Component Value Date   NA 139 05/18/2017   K 4.3 05/18/2017   CL 109 05/18/2017   CO2 21 (L) 05/18/2017   Lab Results  Component Value Date   ALT 17 05/18/2017   AST 20 05/18/2017   ALKPHOS 63 05/18/2017   BILITOT 0.8 05/18/2017     RADIOGRAPHY: Dg Chest Port 1 View  Result Date: 05/18/2017 CLINICAL DATA:  Post bronchoscopy EXAM: PORTABLE CHEST 1 VIEW COMPARISON:  Chest CT 03/27/2017 FINDINGS: No pneumothorax following bronchoscopy. Heart is normal size. No visible confluent airspace opacity or effusion. No acute bony abnormality. IMPRESSION: No pneumothorax following bronchoscopy. Electronically Signed   By: Rolm Baptise M.D.   On: 05/18/2017 11:15      IMPRESSION/PLAN: 1. 82 y.o. with recurrent NSCLC now with new, 20mm solitary brain metastasis.  At this point, the patient would potentially benefit from radiotherapy. The options include whole brain irradiation versus stereotactic radiosurgery. There are pros and cons associated with each of these potential treatment options. Whole brain radiotherapy would treat the known metastatic deposits and help provide some reduction of risk for future brain metastases. However, whole brain radiotherapy carries potential risks including hair loss,  subacute somnolence, and neurocognitive changes including a possible reduction in short-term memory. Whole brain radiotherapy also may carry a lower likelihood of tumor control at the treatment sites because of the low-dose used. Stereotactic radiosurgery carries a higher likelihood for local tumor control at the targeted sites with lower associated risk for neurocognitive changes such as memory loss. However, the use of stereotactic radiosurgery in this setting may leave the patient at increased risk for new brain metastases elsewhere in the brain as high as 50-60%. Accordingly, patients who receive stereotactic radiosurgery in this setting should undergo ongoing surveillance imaging with brain MRI more frequently in order to identify and treat new small brain metastases before they become symptomatic. Stereotactic radiosurgery does carry some different risks, including a risk of radionecrosis.  Today, we reviewed the findings and workup thus far with the patient and his granddaughter. We discussed the dilemma regarding whole brain radiotherapy versus stereotactic radiosurgery. We discussed the pros and cons of each. We also discussed the logistics and delivery of each. We reviewed the results associated with each of the treatments described above. The patient seems to understand the treatment options and would like to proceed with stereotactic radiosurgery. He is scheduled for a 3T SRS protocol MRI brain today at 12:30pm. Pending results from this study, we will plan to offer Stereotactic Radiosurgery [facility]) in a single treatment. He is scheduled for CT Simulation later today at 3pm.  He has an appointment with Dr. Rita Ohara on Tuesday 06/19/17 and tentatively scheduled for [facility] treatment on Thursday 06/21/17.  Today, he freely signed written consent to proceed with treatment and a copy of this document placed in the patient's chart.  Following completion of his SRS treatment, he is planning to complete concurrent  chemoradiation for his mediastinal recurrence  in [place], [place] under the care and direction of Dr. Quitman Livings and Dr. Lonia Chimera.  We spent 60 minutes minutes face  to face with the patient and more than 50% of that time was spent in counseling and/or coordination of care.     Nicholos Johns, PA-C    Tyler Pita, MD  [facility] Direct Dial: [phone]  Fax: [phone] [email or web address]  Skype  [email or web address]  This document serves as a record of services personally performed by Tyler Pita, MD and Freeman Caldron, PA-C. It was created on their behalf by Linward Natal, a trained medical scribe. The creation of this record is based on the scribe's personal observations and the provider's statements to them. This document has been checked and approved by the attending provider.

## 2017-06-13 NOTE — Progress Notes (Signed)
PT's daughter, Revonda Humphrey is aware.

## 2017-06-13 NOTE — Progress Notes (Signed)
APPT MADE

## 2017-06-13 NOTE — Progress Notes (Signed)
  Radiation Oncology         [phone] ________________________________  Name: Dakota Shannon MRN: [id number]  Date: 06/13/2017  DOB: April 18, 1935  SIMULATION AND TREATMENT PLANNING NOTE    ICD-10-CM   1. Primary malignant neoplasm of lung with metastasis to brain [facility]) C34.90    C79.31     DIAGNOSIS:  82 yo man with a solitary left frontal 6 mm brain metastasis from non-small cell cancer of the right upper lung  NARRATIVE:  The patient was brought to the [facility].  Identity was confirmed.  All relevant records and images related to the planned course of therapy were reviewed.  The patient freely provided informed written consent to proceed with treatment after reviewing the details related to the planned course of therapy. The consent form was witnessed and verified by the simulation staff. Intravenous access was established for contrast administration. Then, the patient was set-up in a stable reproducible supine position for radiation therapy.  A relocatable thermoplastic stereotactic head frame was fabricated for precise immobilization.  CT images were obtained.  Surface markings were placed.  The CT images were loaded into the planning software and fused with the patient's targeting MRI scan.  Then the target and avoidance structures were contoured.  Treatment planning then occurred.  The radiation prescription was entered and confirmed.  I have requested 3D planning  I have requested a DVH of the following structures: Brain stem, brain, left eye, right eye, lenses, optic chiasm, target volumes, uninvolved brain, and normal tissue.    SPECIAL TREATMENT PROCEDURE:  The planned course of therapy using radiation constitutes a special treatment procedure. Special care is required in the management of this patient for the following reasons. This treatment constitutes a Special Treatment Procedure for the following reason: High dose per fraction requiring special monitoring for  increased toxicities of treatment including daily imaging.  The special nature of the planned course of radiotherapy will require increased physician supervision and oversight to ensure patient's safety with optimal treatment outcomes.  PLAN:  The patient will receive 20 Gy in 1 fraction.  ________________________________  Sheral Apley Tammi Klippel, M.D.

## 2017-06-14 ENCOUNTER — Encounter (HOSPITAL_COMMUNITY): Payer: Self-pay | Admitting: Gastroenterology

## 2017-06-14 NOTE — Addendum Note (Signed)
Encounter addended by: Malena Edman, RN on: 06/14/2017  8:59 AM<BR>    Actions taken: Charge Capture section accepted

## 2017-06-15 ENCOUNTER — Encounter (HOSPITAL_COMMUNITY): Payer: Self-pay

## 2017-06-18 ENCOUNTER — Encounter: Payer: Self-pay | Admitting: *Deleted

## 2017-06-20 DIAGNOSIS — Z510 Encounter for antineoplastic radiation therapy: Secondary | ICD-10-CM | POA: Diagnosis not present

## 2017-06-21 ENCOUNTER — Encounter: Payer: Self-pay | Admitting: Radiation Oncology

## 2017-06-21 ENCOUNTER — Ambulatory Visit
Admission: RE | Admit: 2017-06-21 | Discharge: 2017-06-21 | Disposition: A | Discharge status: Home / Self Care | Payer: Medicare Other | Source: Ambulatory Visit | Attending: Radiation Oncology | Admitting: Radiation Oncology

## 2017-06-21 VITALS — BP 150/71 | HR 51 | Temp 98.2°F | Resp 18

## 2017-06-21 DIAGNOSIS — C3411 Malignant neoplasm of upper lobe, right bronchus or lung: Secondary | ICD-10-CM

## 2017-06-21 DIAGNOSIS — Z510 Encounter for antineoplastic radiation therapy: Secondary | ICD-10-CM | POA: Diagnosis not present

## 2017-06-21 NOTE — Op Note (Signed)
Stereotactic Radiosurgery Operative Note  Name: Dakota Shannon MRN: [id number]  Date: 06/21/2017  DOB: Oct 11, 1935  Op Note  Pre Operative Diagnosis:  Solitary brain metastasis (6 mm) from metastatic lung cancer  Post Operative Diagnois:  Solitary brain metastasis (6 mm) from metastatic lung cancer  3D TREATMENT PLANNING AND DOSIMETRY:  The patient's radiation plan was reviewed and approved by myself (neurosurgery) and Dr. Ledon Snare (radiation oncology) prior to treatment.  It showed 3-dimensional radiation distributions overlaid onto the planning CT/MRI image set.  The [facility] for the target structures as well as the organs at risk were reviewed. The documentation of the 3D plan and dosimetry are filed in the radiation oncology EMR.  NARRATIVE:  Dakota Shannon was brought to the TrueBeam stereotactic radiation treatment machine and placed supine on the CT couch. The head frame was applied, and the patient was set up for stereotactic radiosurgery.  I was present for the set-up and delivery.  SIMULATION VERIFICATION:  In the couch zero-angle position, the patient underwent Exactrac imaging using the Brainlab system with orthogonal KV images.  These were carefully aligned and repeated to confirm treatment position for each of the isocenters.  The Exactrac snap film verification was repeated at each couch angle.  SPECIAL TREATMENT PROCEDURE: Dakota Shannon received stereotactic radiosurgery to the following targets: Left frontal target was treated using 3 Dynamic Conformal Arcs to a prescription dose of 20 Gy.  ExacTrac registration was performed for each couch angle.  The 80.1% isodose line was prescribed.  STEREOTACTIC TREATMENT MANAGEMENT:  Following delivery, the patient was transported to nursing in stable condition and monitored for possible acute effects.  Vital signs were recorded. The patient tolerated treatment without significant acute effects, and was discharged to home in stable  condition.    PLAN: Follow-up in one month.

## 2017-06-21 NOTE — Progress Notes (Signed)
A&O x 3. No distress noted. BP elevated. Denies pain. Denies headache, dizziness, nausea, diplopia or ringing in the ears. Patient denies taking steroids. Instructed patient to avoid strenuous activity for the next 24 hours. Instructed patient to call [phone] with needs. Patient verbalized understanding. Patient discharged ambulatory, in no distress, and home with his wife.   BP (!) 150/71 (BP Location: Right Arm, Patient Position: Sitting, Cuff Size: Large)   Pulse (!) 51   Temp 98.2 F (36.8 C) (Oral)   Resp 18   SpO2 97%  Wt Readings from Last 3 Encounters:  06/13/17 259 lb 6.4 oz (117.7 kg)  06/06/17 259 lb 3.2 oz (117.6 kg)  05/14/17 261 lb (118.4 kg)

## 2017-06-21 NOTE — Progress Notes (Signed)
  Radiation Oncology         [phone] ________________________________  Stereotactic Treatment Procedure Note  Name: SHIVAM MESTAS MRN: [id number]  Date: 06/21/2017  DOB: Aug 04, 1935  SPECIAL TREATMENT PROCEDURE    ICD-10-CM   1. Malignant neoplasm of upper lobe of right lung ([place]) C34.11     3D TREATMENT PLANNING AND DOSIMETRY:  The patient's radiation plan was reviewed and approved by neurosurgery and radiation oncology prior to treatment.  It showed 3-dimensional radiation distributions overlaid onto the planning CT/MRI image set.  The [facility] for the target structures as well as the organs at risk were reviewed. The documentation of the 3D plan and dosimetry are filed in the radiation oncology EMR.  NARRATIVE:  JOSEDE CICERO was brought to the TrueBeam stereotactic radiation treatment machine and placed supine on the CT couch. The head frame was applied, and the patient was set up for stereotactic radiosurgery.  Neurosurgery was present for the set-up and delivery  SIMULATION VERIFICATION:  In the couch zero-angle position, the patient underwent Exactrac imaging using the Brainlab system with orthogonal KV images.  These were carefully aligned and repeated to confirm treatment position for each of the isocenters.  The Exactrac snap film verification was repeated at each couch angle.  PROCEDURE: Maia Breslow received stereotactic radiosurgery to the following targets: Left frontal 6 mm target was treated using 3 Dynamic Conformal Arcs to a prescription dose of 20 Gy.  ExacTrac registration was performed for each couch angle.  The 80.1% isodose line was prescribed.  6 MV X-rays were delivered in the flattening filter free beam mode.  STEREOTACTIC TREATMENT MANAGEMENT:  Following delivery, the patient was transported to nursing in stable condition and monitored for possible acute effects.  Vital signs were recorded BP (!) 150/71 (BP Location: Right Arm, Patient Position: Sitting,  Cuff Size: Large)   Pulse (!) 51   Temp 98.2 F (36.8 C) (Oral)   Resp 18   SpO2 97% . The patient tolerated treatment without significant acute effects, and was discharged to home in stable condition.    PLAN: Follow-up in one month.  ________________________________  Sheral Apley. Tammi Klippel, M.D.

## 2017-06-22 ENCOUNTER — Encounter (HOSPITAL_COMMUNITY): Payer: Self-pay

## 2017-06-22 NOTE — Progress Notes (Signed)
  Radiation Oncology         [phone] ________________________________  Name: Dakota Shannon MRN: [id number]  Date: 06/21/2017  DOB: 1935/03/23  End of Treatment Note  Diagnosis:   82 y.o. 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  Left frontal 6 mm target was treated using 3 Dynamic Conformal Arcs to a prescription dose of 20 Gy.    Beams/energy:   SBRT/SRT-3D // 6X-FFF Photon.  ExacTrac registration was performed for each couch angle.  The 80.1% isodose line was prescribed.  6 MV X-rays were delivered in the flattening filter free beam mode.  Narrative: The patient tolerated radiation treatment relatively well with no complaints, except for an elevated blood pressure of 150/71 that was noted on physical exam following treatment. He otherwise denied any pain, headaches, dizziness, nausea, diplopia, or tinnitus. The patient was instructed to avoid strenuous activity for the following 24 hours after treatment.  Plan: The patient has completed radiation treatment. The patient will return to radiation oncology clinic for routine followup in one month. I advised him to call or return sooner if he has any questions or concerns related to his recovery or treatment. ________________________________  Sheral Apley. Tammi Klippel, M.D.   This document serves as a record of services personally performed by Tyler Pita, MD. It was created on his behalf by Rae Lips, a trained medical scribe. The creation of this record is based on the scribe's personal observations and the provider's statements to them. This document has been checked and approved by the attending provider.

## 2017-06-25 ENCOUNTER — Encounter (HOSPITAL_COMMUNITY): Payer: Self-pay

## 2017-07-06 ENCOUNTER — Ambulatory Visit: Payer: Medicare Other | Admitting: Pulmonary Disease

## 2017-07-23 ENCOUNTER — Ambulatory Visit
Admission: RE | Admit: 2017-07-23 | Discharge: 2017-07-23 | Disposition: A | Discharge status: Home / Self Care | Payer: Medicare Other | Source: Ambulatory Visit | Attending: Radiation Oncology | Admitting: Radiation Oncology

## 2017-07-23 ENCOUNTER — Encounter: Payer: Self-pay | Admitting: Radiation Oncology

## 2017-07-23 ENCOUNTER — Telehealth: Payer: Self-pay | Admitting: Radiation Oncology

## 2017-07-23 VITALS — BP 118/83 | HR 75 | Temp 97.7°F | Resp 18 | Ht 71.0 in | Wt 250.0 lb

## 2017-07-23 DIAGNOSIS — K208 Other esophagitis: Secondary | ICD-10-CM | POA: Insufficient documentation

## 2017-07-23 DIAGNOSIS — C3490 Malignant neoplasm of unspecified part of unspecified bronchus or lung: Secondary | ICD-10-CM

## 2017-07-23 DIAGNOSIS — C7931 Secondary malignant neoplasm of brain: Secondary | ICD-10-CM | POA: Diagnosis present

## 2017-07-23 DIAGNOSIS — C3411 Malignant neoplasm of upper lobe, right bronchus or lung: Secondary | ICD-10-CM | POA: Insufficient documentation

## 2017-07-23 MED ORDER — SUCRALFATE 1 G PO TABS: 1.0000 g | ORAL_TABLET | Freq: Three times a day (TID) | ORAL | 0 refills | Status: AC

## 2017-07-23 NOTE — Telephone Encounter (Signed)
Pt is coming today

## 2017-07-23 NOTE — Addendum Note (Signed)
Encounter addended by: Malena Edman, RN on: 07/23/2017  9:36 AM<BR>    Actions taken: Charge Capture section accepted

## 2017-07-23 NOTE — Progress Notes (Signed)
Radiation Oncology         [phone] ________________________________  Name: Dakota Shannon MRN: [id number]  Date of Service: 07/23/2017 DOB: June 10, 1935  Post Treatment Note  CC: [name], MD  [name], MD  Diagnosis:   82 y.o. 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  Left frontal 6 mm target was treated using 3 Dynamic Conformal Arcs to a prescription dose of 20 Gy.    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  Narrative:  The patient returns today for routine follow-up.  He tolerated radiotherapy well to the brain and continues with XRT to the RUL in [place].                  On review of systems, the patient states he's doing well overall. He is trying to increase his oral intake and has lost 2 pounds over the weekend. He's lost about 20 pounds since his diagnosis. He reports poor appetite, and has noticed some soreness in his throat since the last week of XRT in [place]. He denies any headaches, nausea, vomiting, fevers, chills, chest pain, or shortness of breath. He's not had any dizzy spells or changes in his auditory or visual acuity. No other complaints are noted.  ALLERGIES:  is allergic to no known allergies.  Meds: Current Outpatient Prescriptions  Medication Sig Dispense Refill  . digoxin (LANOXIN) 0.125 MG tablet Take 0.125 mg by mouth daily.    Marland Kitchen gemfibrozil (LOPID) 600 MG tablet Take 600 mg by mouth 2 (two) times daily before a meal.      . isosorbide mononitrate (IMDUR) 30 MG 24 hr tablet TAKE 1 TABLET BY MOUTH EVERY DAY (Patient taking differently: TAKE 1  TABLET BY MOUTH DAILY AT BEDTIME.) 30 tablet 6  . lisinopril (PRINIVIL,ZESTRIL) 20 MG tablet Take 10-20 mg by mouth 2 (two) times daily. Take one tablet by mouth every morning & 1/2 tablet every evening     . Multiple Vitamin (MULTIVITAMIN WITH MINERALS) TABS tablet Take 1 tablet by mouth daily. Centrum Silver    . omeprazole (PRILOSEC) 20 MG capsule 1 PO 30 MINS PRIOR TO BREAKFAST. 90 capsule 3  . propranolol (INDERAL) 80 MG tablet Take 80 mg by mouth 2 (two) times daily.      . traMADol (ULTRAM) 50 MG tablet Take 1 tablet (50 mg total) by mouth every 6 (six) hours as needed. 15 tablet 0  . sucralfate (CARAFATE) 1 g tablet Take 1 tablet (1 g total) by mouth 4 (four) times daily -  with meals and at bedtime. Crush and mix in 1 oz and take 10 minutes prior to meals and at bedtime. 120 tablet 0   No current facility-administered medications for this encounter.     Physical Findings:  height is 5\' 11"  (1.803 m) and weight is 250 lb (113.4 kg). His oral temperature is 97.7 F (36.5 C). His blood pressure is 118/83 and his pulse is 75. His respiration is 18 and oxygen saturation is 99%.  Pain Assessment Pain Score: 0-No pain/10 In general this is  a well appearing caucasian male in no acute distress. He's alert and oriented x4 and appropriate throughout the examination. Cardiopulmonary assessment is negative for acute distress and he exhibits normal effort.   Lab Findings: Lab Results  Component Value Date   WBC 4.7 05/18/2017   HGB 12.9 (L) 05/18/2017   HCT 40.4 05/18/2017   MCV 90.2 05/18/2017   PLT 195 05/18/2017     Radiographic Findings: No results found.  Impression/Plan: 1. 82 y.o. 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  The patient is doing well since his SRS course, and will continue concurrent chemoRT in [place] to the RUL. We will plan to follow him in our brain and spine oncology clinic along with presenting his  case in conference. He is aware that he will be due for his first MRI in about 2 months. We will contact him regarding this.  2. Radiation esophagitis. I have called in a prescription for Carafate to his pharmacy.    Carola Rhine, PAC

## 2017-08-02 DIAGNOSIS — Z7189 Other specified counseling: Secondary | ICD-10-CM | POA: Diagnosis not present

## 2017-08-02 DIAGNOSIS — Z66 Do not resuscitate: Secondary | ICD-10-CM | POA: Diagnosis not present

## 2017-08-02 DIAGNOSIS — I5031 Acute diastolic (congestive) heart failure: Secondary | ICD-10-CM | POA: Diagnosis present

## 2017-08-02 DIAGNOSIS — C3491 Malignant neoplasm of unspecified part of right bronchus or lung: Secondary | ICD-10-CM | POA: Diagnosis not present

## 2017-08-02 DIAGNOSIS — I5043 Acute on chronic combined systolic (congestive) and diastolic (congestive) heart failure: Secondary | ICD-10-CM | POA: Diagnosis present

## 2017-08-02 DIAGNOSIS — I509 Heart failure, unspecified: Secondary | ICD-10-CM

## 2017-08-02 DIAGNOSIS — E872 Acidosis: Secondary | ICD-10-CM | POA: Diagnosis present

## 2017-08-02 DIAGNOSIS — E782 Mixed hyperlipidemia: Secondary | ICD-10-CM | POA: Diagnosis present

## 2017-08-02 DIAGNOSIS — M1990 Unspecified osteoarthritis, unspecified site: Secondary | ICD-10-CM | POA: Diagnosis present

## 2017-08-02 DIAGNOSIS — I200 Unstable angina: Secondary | ICD-10-CM

## 2017-08-02 DIAGNOSIS — J440 Chronic obstructive pulmonary disease with acute lower respiratory infection: Secondary | ICD-10-CM | POA: Diagnosis present

## 2017-08-02 DIAGNOSIS — J9601 Acute respiratory failure with hypoxia: Secondary | ICD-10-CM | POA: Diagnosis not present

## 2017-08-02 DIAGNOSIS — Z6837 Body mass index (BMI) 37.0-37.9, adult: Secondary | ICD-10-CM

## 2017-08-02 DIAGNOSIS — I248 Other forms of acute ischemic heart disease: Secondary | ICD-10-CM

## 2017-08-02 DIAGNOSIS — C3411 Malignant neoplasm of upper lobe, right bronchus or lung: Secondary | ICD-10-CM | POA: Diagnosis present

## 2017-08-02 DIAGNOSIS — I4891 Unspecified atrial fibrillation: Secondary | ICD-10-CM

## 2017-08-02 DIAGNOSIS — I214 Non-ST elevation (NSTEMI) myocardial infarction: Principal | ICD-10-CM | POA: Diagnosis present

## 2017-08-02 DIAGNOSIS — C8190 Hodgkin lymphoma, unspecified, unspecified site: Secondary | ICD-10-CM | POA: Diagnosis present

## 2017-08-02 DIAGNOSIS — R1310 Dysphagia, unspecified: Secondary | ICD-10-CM | POA: Diagnosis present

## 2017-08-02 DIAGNOSIS — N179 Acute kidney failure, unspecified: Secondary | ICD-10-CM | POA: Diagnosis not present

## 2017-08-02 DIAGNOSIS — I110 Hypertensive heart disease with heart failure: Secondary | ICD-10-CM | POA: Diagnosis present

## 2017-08-02 DIAGNOSIS — F419 Anxiety disorder, unspecified: Secondary | ICD-10-CM | POA: Diagnosis present

## 2017-08-02 DIAGNOSIS — I456 Pre-excitation syndrome: Secondary | ICD-10-CM | POA: Diagnosis present

## 2017-08-02 DIAGNOSIS — Z801 Family history of malignant neoplasm of trachea, bronchus and lung: Secondary | ICD-10-CM

## 2017-08-02 DIAGNOSIS — J189 Pneumonia, unspecified organism: Secondary | ICD-10-CM | POA: Diagnosis not present

## 2017-08-02 DIAGNOSIS — I255 Ischemic cardiomyopathy: Secondary | ICD-10-CM | POA: Diagnosis present

## 2017-08-02 DIAGNOSIS — C3490 Malignant neoplasm of unspecified part of unspecified bronchus or lung: Secondary | ICD-10-CM | POA: Diagnosis not present

## 2017-08-02 DIAGNOSIS — Z79899 Other long term (current) drug therapy: Secondary | ICD-10-CM

## 2017-08-02 DIAGNOSIS — E669 Obesity, unspecified: Secondary | ICD-10-CM | POA: Diagnosis present

## 2017-08-02 DIAGNOSIS — R0602 Shortness of breath: Secondary | ICD-10-CM

## 2017-08-02 DIAGNOSIS — R579 Shock, unspecified: Secondary | ICD-10-CM | POA: Diagnosis not present

## 2017-08-02 DIAGNOSIS — R6520 Severe sepsis without septic shock: Secondary | ICD-10-CM | POA: Diagnosis not present

## 2017-08-02 DIAGNOSIS — W888XXA Exposure to other ionizing radiation, initial encounter: Secondary | ICD-10-CM

## 2017-08-02 DIAGNOSIS — D709 Neutropenia, unspecified: Secondary | ICD-10-CM | POA: Diagnosis present

## 2017-08-02 DIAGNOSIS — I472 Ventricular tachycardia: Secondary | ICD-10-CM | POA: Diagnosis present

## 2017-08-02 DIAGNOSIS — E860 Dehydration: Secondary | ICD-10-CM | POA: Diagnosis present

## 2017-08-02 DIAGNOSIS — Z515 Encounter for palliative care: Secondary | ICD-10-CM

## 2017-08-02 DIAGNOSIS — I480 Paroxysmal atrial fibrillation: Secondary | ICD-10-CM | POA: Diagnosis present

## 2017-08-02 DIAGNOSIS — Z87891 Personal history of nicotine dependence: Secondary | ICD-10-CM

## 2017-08-02 DIAGNOSIS — A419 Sepsis, unspecified organism: Secondary | ICD-10-CM | POA: Diagnosis not present

## 2017-08-02 DIAGNOSIS — N178 Other acute kidney failure: Secondary | ICD-10-CM | POA: Diagnosis not present

## 2017-08-02 DIAGNOSIS — I77810 Thoracic aortic ectasia: Secondary | ICD-10-CM | POA: Diagnosis present

## 2017-08-02 DIAGNOSIS — Z833 Family history of diabetes mellitus: Secondary | ICD-10-CM

## 2017-08-02 DIAGNOSIS — C7931 Secondary malignant neoplasm of brain: Secondary | ICD-10-CM | POA: Diagnosis present

## 2017-08-02 DIAGNOSIS — R5081 Fever presenting with conditions classified elsewhere: Secondary | ICD-10-CM | POA: Diagnosis present

## 2017-08-02 DIAGNOSIS — Z800 Family history of malignant neoplasm of digestive organs: Secondary | ICD-10-CM

## 2017-08-02 DIAGNOSIS — R230 Cyanosis: Secondary | ICD-10-CM | POA: Diagnosis not present

## 2017-08-02 DIAGNOSIS — N19 Unspecified kidney failure: Secondary | ICD-10-CM | POA: Diagnosis present

## 2017-08-02 LAB — CBC WITH DIFFERENTIAL/PLATELET
BASOS ABS: 0 10*3/uL (ref 0.0–0.1)
BASOS PCT: 0 %
EOS PCT: 1 %
Eosinophils Absolute: 0 10*3/uL (ref 0.0–0.7)
HEMATOCRIT: 30.2 % — AB (ref 39.0–52.0)
Hemoglobin: 10.2 g/dL — ABNORMAL LOW (ref 13.0–17.0)
Lymphocytes Relative: 25 %
Lymphs Abs: 0.5 10*3/uL — ABNORMAL LOW (ref 0.7–4.0)
MCH: 29.1 pg (ref 26.0–34.0)
MCHC: 33.8 g/dL (ref 30.0–36.0)
MCV: 86 fL (ref 78.0–100.0)
MONO ABS: 0.1 10*3/uL (ref 0.1–1.0)
MONOS PCT: 5 %
NEUTROS ABS: 1.3 10*3/uL — AB (ref 1.7–7.7)
Neutrophils Relative %: 70 %
PLATELETS: 106 10*3/uL — AB (ref 150–400)
RBC: 3.51 MIL/uL — ABNORMAL LOW (ref 4.22–5.81)
RDW: 15.6 % — AB (ref 11.5–15.5)
WBC: 1.8 10*3/uL — ABNORMAL LOW (ref 4.0–10.5)

## 2017-08-02 LAB — MRSA PCR SCREENING: MRSA by PCR: NEGATIVE

## 2017-08-02 MED ORDER — CHLORHEXIDINE GLUCONATE CLOTH 2 % EX PADS
6.0000 | MEDICATED_PAD | Freq: Every day | CUTANEOUS | Status: DC
  Administered 2017-08-02 – 2017-08-07 (×6): 6 via TOPICAL

## 2017-08-02 MED ORDER — AMIODARONE HCL IN DEXTROSE 360-4.14 MG/200ML-% IV SOLN
60.0000 mg/h | INTRAVENOUS | Status: DC
  Administered 2017-08-03 (×4): 60 mg/h via INTRAVENOUS
  Administered 2017-08-03: 30 mg/h via INTRAVENOUS
  Administered 2017-08-04 (×2): 60 mg/h via INTRAVENOUS
  Administered 2017-08-04: 30 mg/h via INTRAVENOUS
  Administered 2017-08-04 – 2017-08-05 (×4): 60 mg/h via INTRAVENOUS
  Administered 2017-08-05: 30 mg/h via INTRAVENOUS
  Administered 2017-08-06 – 2017-08-08 (×7): 60 mg/h via INTRAVENOUS
  Filled 2017-08-02 (×20): qty 200

## 2017-08-02 MED ORDER — MORPHINE SULFATE (PF) 4 MG/ML IV SOLN
2.0000 mg | INTRAVENOUS | Status: DC | PRN
  Administered 2017-08-06: 2 mg via INTRAVENOUS
  Filled 2017-08-02: qty 1

## 2017-08-02 MED ORDER — SODIUM CHLORIDE 0.9% FLUSH
10.0000 mL | Freq: Two times a day (BID) | INTRAVENOUS | Status: DC
  Administered 2017-08-02 – 2017-08-08 (×5): 10 mL

## 2017-08-02 MED ORDER — SODIUM CHLORIDE 0.9 % IV SOLN: INTRAVENOUS | Status: DC

## 2017-08-02 MED ORDER — AMIODARONE HCL 150 MG/3ML IV SOLN
150.0000 mg | Freq: Once | INTRAVENOUS | Status: AC
  Administered 2017-08-02: 150 mg via INTRAVENOUS
  Filled 2017-08-02: qty 3

## 2017-08-02 MED ORDER — ENSURE ENLIVE PO LIQD
237.0000 mL | Freq: Two times a day (BID) | ORAL | Status: DC
  Administered 2017-08-03 – 2017-08-06 (×2): 237 mL via ORAL

## 2017-08-02 MED ORDER — HEPARIN BOLUS VIA INFUSION
4000.0000 [IU] | Freq: Once | INTRAVENOUS | Status: AC
  Administered 2017-08-02: 4000 [IU] via INTRAVENOUS
  Filled 2017-08-02: qty 4000

## 2017-08-02 MED ORDER — ONDANSETRON HCL 4 MG/2ML IJ SOLN: 4.0000 mg | Freq: Four times a day (QID) | INTRAMUSCULAR | Status: DC | PRN

## 2017-08-02 MED ORDER — SODIUM CHLORIDE 0.9 % IV SOLN
INTRAVENOUS | Status: AC
  Administered 2017-08-02: 19:00:00 via INTRAVENOUS

## 2017-08-02 MED ORDER — LIDOCAINE VISCOUS 2 % MT SOLN
15.0000 mL | OROMUCOSAL | Status: DC | PRN
  Administered 2017-08-03 – 2017-08-06 (×3): 15 mL via OROMUCOSAL
  Filled 2017-08-02 (×6): qty 15

## 2017-08-02 MED ORDER — ACETAMINOPHEN 325 MG PO TABS: 650.0000 mg | ORAL_TABLET | ORAL | Status: DC | PRN

## 2017-08-02 MED ORDER — HEPARIN (PORCINE) IN NACL 100-0.45 UNIT/ML-% IJ SOLN
1100.0000 [IU]/h | INTRAMUSCULAR | Status: DC
  Administered 2017-08-02: 1300 [IU]/h via INTRAVENOUS
  Administered 2017-08-03: 1150 [IU]/h via INTRAVENOUS
  Administered 2017-08-04: 1100 [IU]/h via INTRAVENOUS
  Filled 2017-08-02 (×3): qty 250

## 2017-08-02 MED ORDER — AMIODARONE HCL IN DEXTROSE 360-4.14 MG/200ML-% IV SOLN
60.0000 mg/h | INTRAVENOUS | Status: AC
  Administered 2017-08-02 (×2): 60 mg/h via INTRAVENOUS
  Filled 2017-08-02 (×2): qty 200

## 2017-08-02 MED ORDER — SODIUM CHLORIDE 0.9% FLUSH
10.0000 mL | INTRAVENOUS | Status: DC | PRN
  Administered 2017-08-05: 10 mL
  Filled 2017-08-02: qty 40

## 2017-08-02 NOTE — Progress Notes (Signed)
MD paged Dr. Meda Coffee to make aware pt has arrived to [facility].

## 2017-08-02 NOTE — Progress Notes (Signed)
ANTICOAGULATION CONSULT NOTE - Initial Consult  Pharmacy Consult for Heparin Indication: atrial fibrillation with RVR  Allergies  Allergen Reactions  . No Known Allergies     Patient Measurements: Height: 5\' 9"  (175.3 cm) Weight: 246 lb 11.1 oz (111.9 kg) IBW/kg (Calculated) : 70.7 Heparin Dosing Weight: 95.4 kg  Vital Signs: Temp: 97.6 F (36.4 C) (09/27 1632) Temp Source: Oral (09/27 1632) BP: 90/65 (09/27 1700) Pulse Rate: 142 (09/27 1700)  Labs: No results for input(s): HGB, HCT, PLT, APTT, LABPROT, INR, HEPARINUNFRC, HEPRLOWMOCWT, CREATININE, CKTOTAL, CKMB, TROPONINI in the last 72 hours.  CrCl cannot be calculated (Patient's most recent lab result is older than the maximum 21 days allowed.).   Medical History: Past Medical History:  Diagnosis Date  . Adenocarcinoma of lung [facility])    Resected March 2016  . Arthritis   . Atrial fibrillation ([place])    Possible history based on limited information  . Complication of anesthesia    difficulty urinating  . COPD (chronic obstructive pulmonary disease) ([place])   . Diverticulosis   . Essential hypertension, benign   . Hodgkin's lymphoma ([place])   . Mixed hyperlipidemia   . Swallowing difficulty   . Wolff-Parkinson-White (WPW) syndrome    Possible history based on limited information    Medications:  Prescriptions Prior to Admission  Medication Sig Dispense Refill Last Dose  . digoxin (LANOXIN) 0.125 MG tablet Take 0.125 mg by mouth daily.   9/50/9326 at am  . folic acid (FOLVITE) 712 MCG tablet Take 400 mcg by mouth daily.   07/31/2017 at am  . gemfibrozil (LOPID) 600 MG tablet Take 600 mg by mouth 2 (two) times daily before a meal.     07/31/2017 at am  . isosorbide mononitrate (IMDUR) 30 MG 24 hr tablet TAKE 1 TABLET BY MOUTH EVERY DAY (Patient taking differently: TAKE 1 TABLET (30 MG) BY MOUTH DAILY AT BEDTIME.) 30 tablet 6 07/30/2017 at pm  . lisinopril (PRINIVIL,ZESTRIL) 20 MG tablet Take 10-20 mg by mouth See admin  instructions. Take 1 tablet (20 mg) by mouth every morning and 1/2 tablet (10 mg) at night   07/31/2017 at am  . Multiple Vitamin (MULTIVITAMIN WITH MINERALS) TABS tablet Take 1 tablet by mouth daily. Centrum Silver   07/31/2017 at am  . omeprazole (PRILOSEC) 20 MG capsule 1 PO 30 MINS PRIOR TO BREAKFAST. (Patient taking differently: Take 20 mg by mouth daily before breakfast. ) 90 capsule 3 07/31/2017 at am  . oxyCODONE (OXY IR/ROXICODONE) 5 MG immediate release tablet Take 5 mg by mouth every 6 (six) hours as needed.  0 not yet taken  . propranolol (INDERAL) 80 MG tablet Take 80 mg by mouth 2 (two) times daily.     07/31/2017 at approx 730  . sucralfate (CARAFATE) 1 g tablet Take 1 tablet (1 g total) by mouth 4 (four) times daily -  with meals and at bedtime. Crush and mix in 1 oz and take 10 minutes prior to meals and at bedtime. (Patient taking differently: Take 1 g by mouth See admin instructions. Crush and mix one tablet (1 g)  in 1 oz fluid and take by mouth 10 minutes prior to meals and at bedtime.) 120 tablet 0 07/30/2017  . traMADol (ULTRAM) 50 MG tablet Take 1 tablet (50 mg total) by mouth every 6 (six) hours as needed. (Patient not taking: Reported on 07/23/2017) 15 tablet 0 Not Taking at Unknown time   Scheduled:  . 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 Admitted to [facility] today 07/22/2017 for Afib with RVR.  Pharmacy consulted for IV heparin dosing for Afib with RVR. He was not on anticoagulation PTA.  H/o bloody stools noted in [place].    06/27/17-present: Currently undergoing concurrent chemotherapy with weekly Taxol/Carboplatin and radiotherapy to the RUL. It is noted that Pt states last chemo treatment was last week on Wednesday 9/19 and had radiation treatment Monday and Tuesday 9/24 and 9/25.   Goal of Therapy:  Heparin level 0.3-0.7 units/ml Monitor  platelets by anticoagulation protocol: Yes   Plan:  Give heparin 4000 units IV bolus x1 Start heparin drip at 1300 units/hr  8 hour heparin level  Daily HL and CBC.   Nicole Cella, RPh Clinical Pharmacist 07/29/2017,6:19 PM

## 2017-08-02 NOTE — Progress Notes (Signed)
Pt states last chemo treatment was last week on Wednesday 9/19 and had radiation treatment Monday and Tuesday 9/24 and 9/25. Pt c/o burning/pain in throat area.

## 2017-08-02 NOTE — H&P (Signed)
Cardiology Admission History and Physical:   Patient ID: Dakota Shannon; MRN: [id number]; DOB: 28-Dec-1934   Admission date: 07/28/2017  Primary Care Provider: [name], MD Primary Cardiologist: Dr. Domenic Polite Primary Electrophysiologist:  None  Chief Complaint:  Transfer from [facility] for afib with RVR and NSTEMI  Patient Profile:   Dakota Shannon is a 82 y.o. male with a history of HTN, HLD, hodgkin's lympoma, stage IV RUL nonsmall cell lung cancer with 6mm L frontal metastasis, aortic root dilation (4cm on last echo), h/o asymptomatic Wolff-Parkinson-White syndrome and history of atrial fibrillation  History of Present Illness:   [name] with PMH of HTN, HLD, hodgkin's lympoma, stage IV RUL nonsmall cell lung cancer with 69mm L frontal metastasis, aortic root dilation (4cm on last echo), h/o asymptomatic Wolff-Parkinson-White syndrome and history of atrial fibrillation. According to the patient, he has had atrial flutter ablation remotely in the past. He was diagnosed with right upper lobe non-small cell lung cancer in March 2016 Initially classified as stage Ib and underwent VATS with wedge resection procedure by Dr. Roxy Horseman. Chest CT obtained in November 2017 showed a new 3 mm nodule in the left lingula. On follow-up chest CT in May 2018, the size of nodule increased. In August 2018, he was found to have a 6 mm left frontal lesion on MRI of the brain. He has a history of asymptomatic Wolfe-Parkinson-White syndrome and has been followed by Dr. Domenic Polite. According to Dr. Myles Gip note, he has been having some chronic chest discomfort whenever he walks in cold weather, however it does not appear he has had ischemic workup in the past.  He just had chemotherapy last Wednesday, he finished another course of radiation therapy on Tuesday, after he returned home, he felt very tired and decided to lay down. One of his neighbor came over to measure his blood pressure and noted he was  very hypotensive. He was immediately sent to [facility] for further evaluation. He was given IV fluids for dehydration. Creatinine was elevated. He was also given nystatin swish and swallow. Creatinine improved after hydration. However he suddenly developed an episode of chest pain along with rapid atrial fibrillation with STT wave changes.He was placed on IV Cardizem and IV amiodarone without loading. His heart rate remains uncontrolled in the 120s to 130s range. He was eventually transferred to [facility] for further evaluation. Troponin went up to 0.94.    Past Medical History:  Diagnosis Date  . Adenocarcinoma of lung [facility])    Resected March 2016  . Arthritis   . Atrial fibrillation ([place])    Possible history based on limited information  . Complication of anesthesia    difficulty urinating  . COPD (chronic obstructive pulmonary disease) ([place])   . Diverticulosis   . Essential hypertension, benign   . Hodgkin's lymphoma ([place])   . Mixed hyperlipidemia   . Swallowing difficulty   . Wolff-Parkinson-White (WPW) syndrome    Possible history based on limited information    Past Surgical History:  Procedure Laterality Date  . CATARACT EXTRACTION    . COLONOSCOPY    . ESOPHAGOGASTRODUODENOSCOPY N/A 06/08/2017   Procedure: ESOPHAGOGASTRODUODENOSCOPY (EGD);  Surgeon: Danie Binder, MD;  [facility];  Service: Endoscopy;  Laterality: N/A;  2:30pm  . MEDIASTINOSCOPY N/A 05/18/2017   Procedure: MEDIASTINOSCOPY;  Surgeon: Grace Isaac, MD;  Location: [facility] OR;  Service: Thoracic;  Laterality: N/A;  . NECK LESION BIOPSY    . SAVORY DILATION  N/A 06/08/2017   Procedure: SAVORY DILATION;  Surgeon: Danie Binder, MD;  [facility];  Service: Endoscopy;  Laterality: N/A;  . SKIN CANCER EXCISION    . VIDEO ASSISTED THORACOSCOPY (VATS)/WEDGE RESECTION Right 01/26/2015   Procedure: RIGHT VIDEO ASSISTED THORACOSCOPY (VATS)WEDGE RESECTION OF RIGHT UPPER LOBE, LYMPH  NODE DISSECTION;  Surgeon: Grace Isaac, MD;  Location: [place];  Service: Thoracic;  Laterality: Right;  Marland Kitchen VIDEO BRONCHOSCOPY N/A 01/26/2015   Procedure: VIDEO BRONCHOSCOPY;  Surgeon: Grace Isaac, MD;  Location: [place];  Service: Thoracic;  Laterality: N/A;  . VIDEO BRONCHOSCOPY WITH ENDOBRONCHIAL ULTRASOUND N/A 05/18/2017   Procedure: VIDEO BRONCHOSCOPY WITH ENDOBRONCHIAL ULTRASOUND;  Surgeon: Grace Isaac, MD;  Location: [place];  Service: Thoracic;  Laterality: N/A;     Medications Prior to Admission: Prior to Admission medications   Medication Sig Start Date End Date Taking? Authorizing Provider  digoxin (LANOXIN) 0.125 MG tablet Take 0.125 mg by mouth daily.    [provider]  gemfibrozil (LOPID) 600 MG tablet Take 600 mg by mouth 2 (two) times daily before a meal.      [provider]  isosorbide mononitrate (IMDUR) 30 MG 24 hr tablet TAKE 1 TABLET BY MOUTH EVERY DAY Patient taking differently: TAKE 1 TABLET BY MOUTH DAILY AT BEDTIME. 05/03/17   Satira Sark, MD  lisinopril (PRINIVIL,ZESTRIL) 20 MG tablet Take 10-20 mg by mouth 2 (two) times daily. Take one tablet by mouth every morning & 1/2 tablet every evening  03/18/12   Satira Sark, MD  Multiple Vitamin (MULTIVITAMIN WITH MINERALS) TABS tablet Take 1 tablet by mouth daily. Centrum Silver    [provider]  omeprazole (PRILOSEC) 20 MG capsule 1 PO 30 MINS PRIOR TO BREAKFAST. 06/08/17   Fields, Sandi L, MD  propranolol (INDERAL) 80 MG tablet Take 80 mg by mouth 2 (two) times daily.      [provider]  sucralfate (CARAFATE) 1 g tablet Take 1 tablet (1 g total) by mouth 4 (four) times daily -  with meals and at bedtime. Crush and mix in 1 oz and take 10 minutes prior to meals and at bedtime. 07/23/17   Hayden Pedro, PA-C  traMADol (ULTRAM) 50 MG tablet Take 1 tablet (50 mg total) by mouth every 6 (six) hours as needed. 05/18/17 05/18/18  Grace Isaac, MD      Allergies:    Allergies  Allergen Reactions  . No Known Allergies     Social History:   Social History   Social History  . Marital status: Widowed    Spouse name: N/A  . Number of children: N/A  . Years of education: N/A   Occupational History  . Retired    Social History Main Topics  . Smoking status: Former Smoker    Packs/day: 0.50    Years: 33.00    Types: Cigarettes    Start date: 02/27/1947    Quit date: 11/07/1979  . Smokeless tobacco: Never Used  . Alcohol use No  . Drug use: No  . Sexual activity: Not on file   Other Topics Concern  . Not on file   Social History Narrative   [facility] (05/04/17):   Originally from [place]. He served in [employer]. He was in [employer] in [place]. He was in [place], [place], & [place]. 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 Has a dog and cats at home. No bird exposure. No mold exposure. Questionable exposure to asbestos.     Family History:   The patient's family history includes Diabetes in his brother and mother; Gastric cancer in his maternal grandfather; Lung cancer in his brother and father. There is no history of Lung disease, Colon cancer, or Esophageal cancer.    ROS:  Please see the history of present illness.  All other ROS reviewed and negative.     Physical Exam/Data:   Vitals:   07/31/2017 1632 07/30/2017 1700  BP: 102/83 90/65  Pulse:  (!) 142  Resp: (!) 27 (!) 26  Temp: 97.6 F (36.4 C)   TempSrc: Oral   SpO2: 92% 98%  Weight: 246 lb 11.1 oz (111.9 kg)   Height: 5\' 9"  (1.753 m)     Intake/Output Summary (Last 24 hours) at 07/15/2017 1841 Last data filed at 07/21/2017 1600  Gross per 24 hour  Intake                0 ml  Output              125 ml  Net             -125 ml   Filed Weights   07/15/2017 1632  Weight: 246 lb 11.1 oz (111.9 kg)   Body mass index is 36.43 kg/m.  General:  Well nourished, well  developed, in no acute distress HEENT: normal Lymph: no adenopathy Neck: no JVD Endocrine:  No thryomegaly Vascular: No carotid bruits; FA pulses 2+ bilaterally without bruits  Cardiac:  normal S1, S2; no murmur  +tachycardic, irregular Lungs:  clear to auscultation bilaterally, no wheezing, rhonchi or rales  Abd: soft, nontender, no hepatomegaly  Ext: no edema Musculoskeletal:  No deformities, BUE and BLE strength normal and equal Skin: warm and dry  Neuro:  CNs 2-12 intact, no focal abnormalities noted Psych:  Normal affect    EKG:  The ECG that was done at outside facility however not viewable, will order another one.   Relevant CV Studies:  Echo 02/14/2017 LV EF: 65% -   70%  Study Conclusions  - Left ventricle: The cavity size was normal. Wall thickness was   normal. Systolic function was vigorous. The estimated ejection   fraction was in the range of 65% to 70%. Doppler parameters are   consistent with abnormal left ventricular relaxation (grade 1   diastolic dysfunction). - Aortic valve: Mildly calcified annulus. Trileaflet; mildly   thickened leaflets. Valve area (VTI): 2.36 cm^2. Valve area   (Vmax): 2.36 cm^2. - Technically difficult study.  Laboratory Data:  ChemistryNo results for input(s): NA, K, CL, CO2, GLUCOSE, BUN, CREATININE, CALCIUM, GFRNONAA, GFRAA, ANIONGAP in the last 168 hours.  No results for input(s): PROT, ALBUMIN, AST, ALT, ALKPHOS, BILITOT in the last 168 hours. HematologyNo results for input(s): WBC, RBC, HGB, HCT, MCV, MCH, MCHC, RDW, PLT in the last 168 hours. Cardiac EnzymesNo results for input(s): TROPONINI in the last 168 hours. No results for input(s): TROPIPOC in the last 168 hours.  BNPNo results for input(s): BNP, PROBNP in the last 168 hours.  DDimer No results for input(s): DDIMER in the last 168 hours.  Radiology/Studies:  No results found.  Assessment and Plan:   1. Atrial fibrillation with RVR: his current blood pressure  likely will not tolerate rate control therapy, will continue IV amiodarone. Started on IV heparin.   2. Dehydration: Received IV fluid at [facility]  [place], his lung is clear, given borderline blood pressure, we'll continue fluid hydration  3. Stage IV lung cancer with metastasis to the brain: Undergoing chemotherapy and radiation therapy. We'll need to contact his primary oncologist in the morning to decide his prognosis. If his prognosis is considered positive with less than one year to live, then would not recommend any further invasive workup. Would focus on treating atrial fibrillation only.  4. Elevated troponin: Occurred in the setting of atrial fibrillation with RVR, continue IV hydration to improve blood pressure. We'll decide whether or not to pursue any further workup depending on his prognosis and to see if there is any contraindication with one platelet therapy with oncology service. Obtain Echo. Trend trop  5. H/o WPW: asymptomatic  Severity of Illness: The appropriate patient status for this patient is INPATIENT. Inpatient status is judged to be reasonable and necessary in order to provide the required intensity of service to ensure the patient's safety. The patient's presenting symptoms, physical exam findings, and initial radiographic and laboratory data in the context of their chronic comorbidities is felt to place them at high risk for further clinical deterioration. Furthermore, it is not anticipated that the patient will be medically stable for discharge from the hospital within 2 midnights of admission. The following factors support the patient status of inpatient.   " The patient's presenting symptoms include chest pain, elevated trop, uncontrolled HR. " The worrisome physical exam findings include HR > 140, BP border line. " The initial radiographic and laboratory data are worrisome because of elevated trop. " The chronic co-morbidities include stage IV Lung cancer with  metastasis to the brain.  * I certify that at the point of admission it is my clinical judgment that the patient will require inpatient hospital care spanning beyond 2 midnights from the point of admission due to high intensity of service, high risk for further deterioration and high frequency of surveillance required.*   For questions or updates, please contact [place] Please consult [email or web address] for contact info under Cardiology/STEMI.   Hilbert Corrigan, [place]  07/12/2017 6:41 PM   The patient was seen, examined and discussed with Almyra Deforest, PA-C and I agree with the above.   82 y.o. male with a history of HTN, HLD, hodgkin's lympoma, stage IV RUL nonsmall cell lung cancer with 52mm L frontal metastasis, aortic root dilation (4cm on last echo), h/o asymptomatic Wolff-Parkinson-White syndrome and history of atrial fibrillation. The patient has stage IV RUL nonsmall cell lung cancer with 35mm L frontal metastasis, he was diagnosed with right upper lobe non-small cell lung cancer in March 2016 Initially classified as stage Ib and underwent VATS with wedge resection procedure by Dr. Roxy Horseman. Chest CT obtained in November 2017 showed a new 3 mm nodule in the left lingula. On follow-up chest CT in May 2018, the size of nodule increased. In August 2018, he was found to have a 6 mm left frontal lesion on MRI of the brain. He has underwent 24/32 chemo cycles. On Tuesday he felt weak, presented to the ER, treated for dehydration, he complained of chest pain yesterday and was found to be in a-fib with RVR, started on amiodarone drip. His troponin was elevated at 0.94. The patient was transferred here with plan for a cardiac catheterization.   He has ongoing mild chest pain - left sided, some SOB, his heart rate is 150-160 BPM. He appears dry. Complains of throat pain and inability to swallow because of that.  This is a very difficult situation, oncology notes dont state anything about patient's  prognosis, he is under impression that his stage 4 cancer is curable.   Plan: - start heparin drip - add amiodarone bolus (60 cc/hr) followed by 30 cc/hr - continue rending troponin, this might be just demand ischemia in the settings of a-fib with RRVR - order echocardiogram - start morphine injections for pain control - start iv fluids - contact patient's oncologist to answer two questions;   1. What is patient's prognosis   2. Would DAPT interfere with future chemotherapy and pancytopenia? - keep NPO for a potential cath in the am - for now focus of rate control and pain management, hydration  Ena Dawley, MD 07/17/2017

## 2017-08-02 NOTE — Progress Notes (Signed)
  Amiodarone Drug - Drug Interaction Consult Note  Recommendations: Reduce digoxin dose by half (50%) if resumed.  Amiodarone is metabolized by the cytochrome P450 system and therefore has the potential to cause many drug interactions. Amiodarone has an average plasma half-life of 50 days (range 20 to 100 days).   There is potential for drug interactions to occur several weeks or months after stopping treatment and the onset of drug interactions may be slow after initiating amiodarone.   []  Statins: Increased risk of myopathy. Simvastatin- restrict dose to 20mg  daily. Other statins: counsel patients to report any muscle pain or weakness immediately.  []  Anticoagulants: Amiodarone can increase anticoagulant effect. Consider warfarin dose reduction. Patients should be monitored closely and the dose of anticoagulant altered accordingly, remembering that amiodarone levels take several weeks to stabilize.  []  Antiepileptics: Amiodarone can increase plasma concentration of phenytoin, the dose should be reduced. Note that small changes in phenytoin dose can result in large changes in levels. Monitor patient and counsel on signs of toxicity.  [x]  Beta blockers: increased risk of bradycardia, AV block and myocardial depression. Sotalol - avoid concomitant use.  []   Calcium channel blockers (diltiazem and verapamil): increased risk of bradycardia, AV block and myocardial depression.  []   Cyclosporine: Amiodarone increases levels of cyclosporine. Reduced dose of cyclosporine is recommended.  [x]  Digoxin dose should be halved when amiodarone is started.  []  Diuretics: increased risk of cardiotoxicity if hypokalemia occurs.  []  Oral hypoglycemic agents (glyburide, glipizide, glimepiride): increased risk of hypoglycemia. Patient's glucose levels should be monitored closely when initiating amiodarone therapy.   []  Drugs that prolong the QT interval:  Torsades de pointes risk may be increased with  concurrent use - avoid if possible.  Monitor QTc, also keep magnesium/potassium WNL if concurrent therapy can't be avoided. Marland Kitchen Antibiotics: e.g. fluoroquinolones, erythromycin. . Antiarrhythmics: e.g. quinidine, procainamide, disopyramide, sotalol. . Antipsychotics: e.g. phenothiazines, haloperidol.  . Lithium, tricyclic antidepressants, and methadone.  Thank You,  Nicole Cella, RPh Clinical Pharmacist 07/26/2017 10:33 PM

## 2017-08-03 ENCOUNTER — Inpatient Hospital Stay (HOSPITAL_COMMUNITY): Payer: Medicare Other

## 2017-08-03 ENCOUNTER — Other Ambulatory Visit (HOSPITAL_COMMUNITY): Payer: Medicare Other

## 2017-08-03 ENCOUNTER — Encounter (HOSPITAL_COMMUNITY): Payer: Self-pay | Admitting: *Deleted

## 2017-08-03 DIAGNOSIS — I214 Non-ST elevation (NSTEMI) myocardial infarction: Secondary | ICD-10-CM | POA: Diagnosis present

## 2017-08-03 DIAGNOSIS — N19 Unspecified kidney failure: Secondary | ICD-10-CM | POA: Diagnosis present

## 2017-08-03 DIAGNOSIS — I5031 Acute diastolic (congestive) heart failure: Secondary | ICD-10-CM | POA: Diagnosis present

## 2017-08-03 DIAGNOSIS — N178 Other acute kidney failure: Secondary | ICD-10-CM

## 2017-08-03 LAB — COMPREHENSIVE METABOLIC PANEL
ALK PHOS: 57 U/L (ref 38–126)
ALT: 47 U/L (ref 17–63)
AST: 68 U/L — AB (ref 15–41)
Albumin: 3.1 g/dL — ABNORMAL LOW (ref 3.5–5.0)
Anion gap: 8 (ref 5–15)
BILIRUBIN TOTAL: 0.8 mg/dL (ref 0.3–1.2)
BUN: 29 mg/dL — AB (ref 6–20)
CALCIUM: 8.5 mg/dL — AB (ref 8.9–10.3)
CHLORIDE: 120 mmol/L — AB (ref 101–111)
CO2: 13 mmol/L — ABNORMAL LOW (ref 22–32)
CREATININE: 1.29 mg/dL — AB (ref 0.61–1.24)
GFR, EST AFRICAN AMERICAN: 59 mL/min — AB (ref >60)
GFR, EST NON AFRICAN AMERICAN: 51 mL/min — AB (ref >60)
Glucose, Bld: 123 mg/dL — ABNORMAL HIGH (ref 65–99)
Potassium: 4 mmol/L (ref 3.5–5.1)
Sodium: 141 mmol/L (ref 135–145)
TOTAL PROTEIN: 5.5 g/dL — AB (ref 6.5–8.1)

## 2017-08-03 LAB — HEPARIN LEVEL (UNFRACTIONATED)
Heparin Unfractionated: 0.95 IU/mL — ABNORMAL HIGH (ref 0.30–0.70)
Heparin Unfractionated: 0.67 IU/mL (ref 0.30–0.70)

## 2017-08-03 LAB — CBC
HEMATOCRIT: 30 % — AB (ref 39.0–52.0)
Hemoglobin: 10.1 g/dL — ABNORMAL LOW (ref 13.0–17.0)
MCH: 28.9 pg (ref 26.0–34.0)
MCHC: 33.7 g/dL (ref 30.0–36.0)
MCV: 86 fL (ref 78.0–100.0)
PLATELETS: 122 10*3/uL — AB (ref 150–400)
RBC: 3.49 MIL/uL — ABNORMAL LOW (ref 4.22–5.81)
RDW: 15.8 % — ABNORMAL HIGH (ref 11.5–15.5)
WBC: 2.2 10*3/uL — ABNORMAL LOW (ref 4.0–10.5)

## 2017-08-03 LAB — TROPONIN I
TROPONIN I: 6.48 ng/mL — AB (ref <0.03)
TROPONIN I: 15.68 ng/mL — AB (ref <0.03)
Troponin I: 18.3 ng/mL (ref <0.03)

## 2017-08-03 LAB — BASIC METABOLIC PANEL
Anion gap: 4 — ABNORMAL LOW (ref 5–15)
BUN: 29 mg/dL — AB (ref 6–20)
CALCIUM: 8.4 mg/dL — AB (ref 8.9–10.3)
CO2: 17 mmol/L — ABNORMAL LOW (ref 22–32)
CREATININE: 1.37 mg/dL — AB (ref 0.61–1.24)
Chloride: 120 mmol/L — ABNORMAL HIGH (ref 101–111)
GFR, EST AFRICAN AMERICAN: 55 mL/min — AB (ref >60)
GFR, EST NON AFRICAN AMERICAN: 47 mL/min — AB (ref >60)
Glucose, Bld: 113 mg/dL — ABNORMAL HIGH (ref 65–99)
Potassium: 3.8 mmol/L (ref 3.5–5.1)
SODIUM: 141 mmol/L (ref 135–145)

## 2017-08-03 MED ORDER — ORAL CARE MOUTH RINSE
15.0000 mL | Freq: Two times a day (BID) | OROMUCOSAL | Status: DC
  Administered 2017-08-03 – 2017-08-08 (×7): 15 mL via OROMUCOSAL

## 2017-08-03 MED ORDER — LEVALBUTEROL HCL 0.63 MG/3ML IN NEBU
0.6300 mg | INHALATION_SOLUTION | Freq: Four times a day (QID) | RESPIRATORY_TRACT | Status: DC | PRN
  Administered 2017-08-03: 0.63 mg via RESPIRATORY_TRACT
  Filled 2017-08-03: qty 3

## 2017-08-03 MED ORDER — FOLIC ACID 1 MG PO TABS
0.5000 mg | ORAL_TABLET | Freq: Every day | ORAL | Status: DC
  Administered 2017-08-04 – 2017-08-06 (×3): 0.5 mg via ORAL
  Filled 2017-08-03 (×4): qty 1

## 2017-08-03 MED ORDER — ALPRAZOLAM 0.25 MG PO TABS
0.2500 mg | ORAL_TABLET | Freq: Two times a day (BID) | ORAL | Status: DC | PRN
  Administered 2017-08-03 – 2017-08-06 (×4): 0.25 mg via ORAL
  Filled 2017-08-03 (×4): qty 1

## 2017-08-03 MED ORDER — ZOLPIDEM TARTRATE 5 MG PO TABS
5.0000 mg | ORAL_TABLET | Freq: Every evening | ORAL | Status: DC | PRN
Start: 2017-08-03 — End: 2017-08-07
  Administered 2017-08-03 – 2017-08-06 (×2): 5 mg via ORAL
  Filled 2017-08-03 (×2): qty 1

## 2017-08-03 NOTE — Progress Notes (Signed)
Initial Nutrition Assessment  DOCUMENTATION CODES:  Obesity unspecified  INTERVENTION:  Patient w/ significant radiation induced esophagitis. Suffering from dysphagia and odynophagia. Had been on Puree diet at [facility]. Will downgrade to Puree  Meal and food requests taken  Showed menu in room and how to call [facility]  NUTRITION DIAGNOSIS:  Increased nutrient needs related to cancer and cancer related treatments as evidenced by estimated nutritional requirements for the condition  GOAL:  Patient will meet greater than or equal to 90% of their needs  MONITOR:  PO intake, Supplement acceptance, Labs, Weight trends  REASON FOR ASSESSMENT:  Malnutrition Screening Tool    ASSESSMENT:  82 y/o male PMHx HTN/HLD, Hodgkin's lymphoma, Stage IV NSCLC w/ brain metastases. Transferred from OSH to which he presented with fatigue and hypotension. Has Afib w/ RVR and suffered NSTEMI. Transferred to [facility] for potential cardiac cath.   RD spoke with patient and his daughter. He has been undergoing chemoradiation (reports last chemo infusion Wednesday and radiation tx on Tuesday) RD asked how he has been doing during his cancer treatment. He says "I was doing great until I stopped eating"    Pt and daughter reveal that the patient has had significant dysphagia and odynophagia related to his radiation. He has radiation induced esophagitis that has really affected his intake over the past couple months. This coincides with his weight loss.  Per chart, the patient has lost 27 lbs in approximately 1 year, most of it has been in the last couple months. Has lost 12-13 lbs since Early August or ~5% bw. This is not significant given time frame of 1.5 months.   He says he was on puree diet at [facility]. Daughter states "this is what he truly needs", given his level of difficulty and pain with swallowing. RD stated he would downgrade diet.   At baseline, the patient has been eating 2 meals a day at home. He will eat  a breakfast, snack in the middle of the day, and then have 1 hot meal. Recently, his daughter has been bringing him Premier Protein due to his trouble eating.   RD discussed how Premier Protein, while higher in Protein, is fairly low in calories. If patient likes all supplements equally, RD advocated for a higher kcal supplement, though if he only likes Premier Protein, he should certainly continue with that. He says he not a fan of any supplements. His daughter states that she had just gotten off the phone with his Rad Onc and said he will not be getting anymore radiation so he should be able to eat again soon  RD took a list of items the patient felt he would do best with. These included soup, jello, ice cream. Recommended continuing to Ensure ENlive.   NFPE: WDL. Obese.   No documented PO intake at this time.  Labs: BUN/Creat: 29/1.37, slight rise, BG -110-125, Albumin: 3.1 Meds: Ensure Enlive  Diet Order:  Diet Heart Room service appropriate? Yes; Fluid consistency: Thin  Skin:  Reviewed, no issues  Last BM:  9/26  Height:  Ht Readings from Last 1 Encounters:  08/03/2017 5\' 9"  (1.753 m)   Weight:  Wt Readings from Last 1 Encounters:  07/20/2017 246 lb 11.1 oz (111.9 kg)   Wt Readings from Last 10 Encounters:  07/11/2017 246 lb 11.1 oz (111.9 kg)  07/23/17 250 lb (113.4 kg)  06/13/17 259 lb 6.4 oz (117.7 kg)  06/06/17 259 lb 3.2 oz (117.6 kg)  05/14/17 261 lb (118.4 kg)  05/04/17 261 lb 3.2 oz (118.5 kg)  02/02/17 265 lb (120.2 kg)  09/14/16 269 lb 11.2 oz (122.3 kg)  08/01/16 272 lb (123.4 kg)  02/01/16 272 lb 3.2 oz (123.5 kg)   Ideal Body Weight:  72.73 kg  BMI:  Body mass index is 36.43 kg/m.  Estimated Nutritional Needs:  Kcal:  1900-2150 (17-19 kcal/kg bw) Protein:  95-110g Pro (1.3-1.5 g/kg ibw) Fluid:  1.9-2.1 L fluid (1 ml/kcal)  EDUCATION NEEDS:  No education needs identified at this time  Burtis Junes RD, LDN, [place] [facility] Pager:  [phone] 08/03/2017 12:51 PM

## 2017-08-03 NOTE — Progress Notes (Signed)
ANTICOAGULATION CONSULT NOTE - Follow Up Consult  Pharmacy Consult for Heparin  Indication: atrial fibrillation  Allergies  Allergen Reactions  . No Known Allergies     Patient Measurements: Height: 5\' 9"  (175.3 cm) Weight: 246 lb 11.1 oz (111.9 kg) IBW/kg (Calculated) : 70.7  Vital Signs: Temp: 98.3 F (36.8 C) (09/28 0400) Temp Source: Oral (09/28 0400) BP: 126/112 (09/28 0534) Pulse Rate: 128 (09/28 0534)  Labs:  Recent Labs  07/11/2017 1845 08/03/17 0115 08/03/17 0434  HGB 10.2*  --  10.1*  HCT 30.2*  --  30.0*  PLT 106*  --  122*  HEPARINUNFRC  --   --  0.95*  CREATININE 1.29*  --   --   TROPONINI 6.48* 15.68*  --     Estimated Creatinine Clearance: 56.3 mL/min (A) (by C-G formula based on SCr of 1.29 mg/dL (H)).  Assessment: Heparin for afib, initial heparin level is elevated, no issues per RN.   Goal of Therapy:  Heparin level 0.3-0.7 units/ml Monitor platelets by anticoagulation protocol: Yes   Plan:  -Dec heparin to 1150 units/hr -1400 HL  Dakota Shannon 08/03/2017,5:38 AM

## 2017-08-03 NOTE — Care Management Note (Addendum)
Case Management Note  Patient Details  Name: Dakota Shannon MRN: [id number] Date of Birth: 1935/07/05  Subjective/Objective:  From home alone pta indep, presents with NSTEMI, afibe, dehydration, stage 4 lung cancer with mets to brain, h/o WPW.  Plan for cath later today.   10/1 [place], BSN - Palliative consulted for [place], conts on amio, neo, and iv abx, NCM will cont to follow for dc needs.    10/3 [place], BSN - actively dying, on morphine drip, off bipap.                  Action/Plan: NCM will follow for dc needs.   Expected Discharge Date:                  Expected Discharge Plan:     In-House Referral:     Discharge planning Services  CM Consult  Post Acute Care Choice:    Choice offered to:     DME Arranged:    DME Agency:     HH Arranged:    HH Agency:     Status of Service:  In process, will continue to follow  If discussed at Long Length of Stay Meetings, dates discussed:    Additional Comments:  Zenon Mayo, RN 08/03/2017, 11:02 AM

## 2017-08-03 NOTE — Progress Notes (Signed)
MD Hochrein notified of CRITICAL trop 6.48 and 15.68 and that a STAT ECG was obtained.  MD aware and will follow up with any new orders. Pt denies any CP or shortness of breath, but does state he feels somewhat anxious and that this is normal for him at night.  Pt remains AOx4.

## 2017-08-03 NOTE — Progress Notes (Addendum)
Progress Note  Patient Name: Dakota Shannon Date of Encounter: 08/03/2017  Primary Cardiologist: Dr. Domenic Polite  Subjective   Feels SOB but chest pain free.   Inpatient Medications    Scheduled Meds: . Chlorhexidine Gluconate Cloth  6 each Topical Daily  . feeding supplement (ENSURE ENLIVE)  237 mL Oral BID BM  . mouth rinse  15 mL Mouth Rinse BID  . sodium chloride flush  10-40 mL Intracatheter Q12H   Continuous Infusions: . sodium chloride 125 mL/hr at 07/18/2017 1904  . amiodarone 60 mg/hr (08/03/17 0601)  . heparin 1,150 Units/hr (08/03/17 0540)   PRN Meds: acetaminophen, lidocaine, morphine injection, ondansetron (ZOFRAN) IV, sodium chloride flush, zolpidem   Vital Signs    Vitals:   08/03/17 0715 08/03/17 0800 08/03/17 0814 08/03/17 0900  BP: 117/90 138/70  108/70  Pulse: (!) 107 100  (!) 101  Resp: (!) 29 (!) 26  (!) 21  Temp:   (!) 97.3 F (36.3 C)   TempSrc:   Oral   SpO2: 92% 100%  100%  Weight:      Height:        Intake/Output Summary (Last 24 hours) at 08/03/17 0928 Last data filed at 08/03/17 0800  Gross per 24 hour  Intake          2140.38 ml  Output             1075 ml  Net          1065.38 ml   Filed Weights   07/30/2017 1632  Weight: 246 lb 11.1 oz (111.9 kg)    Telemetry    In and out of a-fib, SR with 1.AVB, - Personally Reviewed  ECG    A-fib 133 BPM, negative T waves and ST depressions in the inferior and anterolateral leads - Personally Reviewed  Physical Exam   GEN: No acute distress.   Neck: No JVD Cardiac: iRRR, 2/6 systolic murmurs, rubs, or gallops.  Respiratory: Clear to auscultation bilaterally. GI: Soft, nontender, non-distended  MS: minimal edema; No deformity. Neuro:  Nonfocal  Psych: Normal affect   Labs    Chemistry Recent Labs Lab 08/01/2017 1845 08/03/17 0434  NA 141 141  K 4.0 3.8  CL 120* 120*  CO2 13* 17*  GLUCOSE 123* 113*  BUN 29* 29*  CREATININE 1.29* 1.37*  CALCIUM 8.5* 8.4*  PROT 5.5*  --    ALBUMIN 3.1*  --   AST 68*  --   ALT 47  --   ALKPHOS 57  --   BILITOT 0.8  --   GFRNONAA 51* 47*  GFRAA 59* 55*  ANIONGAP 8 4*     Hematology Recent Labs Lab 07/23/2017 1845 08/03/17 0434  WBC 1.8* 2.2*  RBC 3.51* 3.49*  HGB 10.2* 10.1*  HCT 30.2* 30.0*  MCV 86.0 86.0  MCH 29.1 28.9  MCHC 33.8 33.7  RDW 15.6* 15.8*  PLT 106* 122*    Cardiac Enzymes Recent Labs Lab 07/14/2017 1845 08/03/17 0115 08/03/17 0434  TROPONINI 6.48* 15.68* 18.30*   No results for input(s): TROPIPOC in the last 168 hours.   BNPNo results for input(s): BNP, PROBNP in the last 168 hours.   DDimer No results for input(s): DDIMER in the last 168 hours.   Radiology    No results found.  Cardiac Studies   TTE pending  Patient Profile     82 y.o. male   Assessment & Plan    1. Atrial fibrillation with RVR:  2. NSTEMI 3. Dehydration: Received IV fluid at [facility], his lung is clear, given borderline blood pressure, we'll continue fluid hydration 4. 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 H/o WPW  81 y.o. male with a history of HTN, HLD, hodgkin's lympoma, stage IV RUL nonsmall cell lung cancer with 51mm L frontal metastasis, aortic root dilation (4cm on last echo), h/o asymptomatic Wolff-Parkinson-White syndrome and history of atrial fibrillation. The patient has stage IV RUL nonsmall cell lung cancer with 2mm L frontal metastasis, he was diagnosed with right upper lobe non-small cell lung cancer in March 2016 Initially classified as stage Ib and underwent VATS with wedge resection procedure by Dr. Roxy Horseman. Chest CT obtained in November 2017 showed a new 3 mm nodule in the left lingula. On follow-up chest CT in May 2018, the size of nodule increased. In August 2018, he was found to have a 6 mm left frontal lesion on MRI of the brain. He has underwent 24/32 chemo cycles. On Tuesday he felt weak, presented to the ER, treated for dehydration, he complained of chest pain  yesterday and was found to be in a-fib with RVR, started on amiodarone drip. His troponin was elevated at 0.94. The patient was transferred here with plan for a cardiac catheterization.   Troponin overnight 1->6_>15->18, on heparin drip. In and out of a-fib, now with rates in low 100. We  Are awaiting a phone call from Dr Lonia Chimera - his oncologist about his overall prognosis and if DAPT would interfere with future chemotherapy and pancytopenia? Echo is pending.  I have spoken to his oncologist [place], he is opposed to the stenting as it would increase risk of bleeding from brain metastasis. Also overall prognosis of survival in the next 12 months < 50%. I have discussed the case with our interventional team, they feel that with underlying a-fib (CHADS-VASc 5) and necessity of long term anticoagulation, the addition of DAPT would place this patient at a very high risk of bleeding from his primary tumor pr brain metastases. We will treat conservatively. D/C iv fluids, start lasix 40 mg po BID x 2 doses and reassess.  Extensive time was spend discussing the patients case with the patient interventional team, his oncologist and patient's management. Total time spent with the patient 1 hour.   For questions or updates, please contact [place] Please consult [email or web address] for contact info under Cardiology/STEMI.     Signed, Ena Dawley, MD  08/03/2017, 9:28 AM

## 2017-08-03 NOTE — Progress Notes (Signed)
Pt got up to bedside commode for BM. Refused bedpan. Pt got extremely  SOB when getting back to bed. Pt BP dropped 70/40's RR 40's and sats 80's. PT encouraged to take deep breaths. NP for Cardiology paged and made aware. Awaiting call back.

## 2017-08-03 NOTE — Progress Notes (Signed)
[place] with Cardiology called put new orders in. Made aware  pt BP has increased and o2 sats mid 90's on 4L RR upper 20's HR 90-110's. Will continue to monitor.

## 2017-08-03 NOTE — Progress Notes (Signed)
Per order, Notified on-call cardiology, Dr. Percival Spanish that the patient had converted to SR at the beginning of shift and that within the past hour he had gone back into Afib RVR.  Pt is still on amio drip at reduced rate of 30 mg/hr per order.  No new orders given.  Will continue to monitor the pt for any other changes.  Pt is AOx4, NAD at this time.

## 2017-08-03 NOTE — Progress Notes (Addendum)
[place] for Heparin Indication: atrial fibrillation with RVR  Allergies  Allergen Reactions  . No Known Allergies     Patient Measurements: Height: 5\' 9"  (175.3 cm) Weight: 246 lb 11.1 oz (111.9 kg) IBW/kg (Calculated) : 70.7 Heparin Dosing Weight: 95.4 kg  Vital Signs: Temp: 97.7 F (36.5 C) (09/28 1222) Temp Source: Oral (09/28 1222) BP: 113/64 (09/28 1407) Pulse Rate: 101 (09/28 1407)  Labs:  Recent Labs  07/09/2017 1845 08/03/17 0115 08/03/17 0434 08/03/17 1401  HGB 10.2*  --  10.1*  --   HCT 30.2*  --  30.0*  --   PLT 106*  --  122*  --   HEPARINUNFRC  --   --  0.95* 0.67  CREATININE 1.29*  --  1.37*  --   TROPONINI 6.48* 15.68* 18.30*  --     Estimated Creatinine Clearance: 53 mL/min (A) (by C-G formula based on SCr of 1.37 mg/dL (H)).   Medical History: Past Medical History:  Diagnosis Date  . Adenocarcinoma of lung [facility])    Resected March 2016  . Arthritis   . Atrial fibrillation ([place])    Possible history based on limited information  . Complication of anesthesia    difficulty urinating  . COPD (chronic obstructive pulmonary disease) ([place])   . Diverticulosis   . Essential hypertension, benign   . Hodgkin's lymphoma ([place])   . Mixed hyperlipidemia   . Swallowing difficulty   . Wolff-Parkinson-White (WPW) syndrome    Possible history based on limited information    Medications:  Prescriptions Prior to Admission  Medication Sig Dispense Refill Last Dose  . digoxin (LANOXIN) 0.125 MG tablet Take 0.125 mg by mouth daily.   1/61/0960 at am  . folic acid (FOLVITE) 454 MCG tablet Take 400 mcg by mouth daily.   07/31/2017 at am  . gemfibrozil (LOPID) 600 MG tablet Take 600 mg by mouth 2 (two) times daily before a meal.     07/31/2017 at am  . isosorbide mononitrate (IMDUR) 30 MG 24 hr tablet TAKE 1 TABLET BY MOUTH EVERY DAY (Patient taking differently: TAKE 1 TABLET (30 MG) BY MOUTH DAILY AT BEDTIME.) 30 tablet 6  07/30/2017 at pm  . lisinopril (PRINIVIL,ZESTRIL) 20 MG tablet Take 10-20 mg by mouth See admin instructions. Take 1 tablet (20 mg) by mouth every morning and 1/2 tablet (10 mg) at night   07/31/2017 at am  . Multiple Vitamin (MULTIVITAMIN WITH MINERALS) TABS tablet Take 1 tablet by mouth daily. Centrum Silver   07/31/2017 at am  . omeprazole (PRILOSEC) 20 MG capsule 1 PO 30 MINS PRIOR TO BREAKFAST. (Patient taking differently: Take 20 mg by mouth daily before breakfast. ) 90 capsule 3 07/31/2017 at am  . oxyCODONE (OXY IR/ROXICODONE) 5 MG immediate release tablet Take 5 mg by mouth every 6 (six) hours as needed.  0 not yet taken  . propranolol (INDERAL) 80 MG tablet Take 80 mg by mouth 2 (two) times daily.     07/31/2017 at approx 730  . sucralfate (CARAFATE) 1 g tablet Take 1 tablet (1 g total) by mouth 4 (four) times daily -  with meals and at bedtime. Crush and mix in 1 oz and take 10 minutes prior to meals and at bedtime. (Patient taking differently: Take 1 g by mouth See admin instructions. Crush and mix one tablet (1 g)  in 1 oz fluid and take by mouth 10 minutes prior to meals and at bedtime.) 120 tablet 0  07/30/2017  . traMADol (ULTRAM) 50 MG tablet Take 1 tablet (50 mg total) by mouth every 6 (six) hours as needed. (Patient not taking: Reported on 07/28/2017) 15 tablet 0 Not Taking at Unknown time   Scheduled:  . Chlorhexidine Gluconate Cloth  6 each Topical Daily  . feeding supplement (ENSURE ENLIVE)  237 mL Oral BID BM  . folic acid  0.5 mg Oral Daily  . mouth rinse  15 mL Mouth Rinse BID  . sodium chloride flush  10-40 mL Intracatheter Q12H    Assessment: 82 y.o male with h/o with a solitary left frontal 6 mm brain metastasis from non-small cell cancer of the right upper lung. Admitted to [facility]  08/05/2017 for Afib with RVR.  Pharmacy consulted for IV heparin dosing for Afib with RVR. He is not on anticoagulation PTA.  H/o bloody stools noted in [place].   06/27/17-present: Currently undergoing  concurrent chemotherapy with weekly Taxol/Carboplatin and radiotherapy to the RUL. It is noted that Pt states last chemo treatment was last week on Wednesday 9/19 and had radiation treatment Monday and Tuesday 9/24 and 9/25.   Heparin level 0.67 today, at the higher end of the range. RN alerted pharmacy that when patient blew his nose today, some blood present. Notified NP working with Dr. Meda Coffee. Will monitor very closely for now.   Goal of Therapy:  Heparin level 0.3-0.7 units/ml Monitor platelets by anticoagulation protocol: Yes   Plan:  Decrease the heparin gtt a little bit to 1100 units/hr since at higher end of the goal AM HL Daily HL and CBC    Leroy Libman, PharmD Pharmacy Resident Pager: [phone]

## 2017-08-03 NOTE — Progress Notes (Signed)
   Called with critical troponin.  Chart reviewed.  The patient was in atrial fib earlier and converted to NSR but went back into atrial fib.  Troponin was elevated earlier this evening.  Now 15.58.  However, the patient is not having chest pain.  He does have atrial fib with rapid rate and ST depression on the EKG.    BP 104/74   Pulse (!) 149   Temp 98.4 F (36.9 C) (Oral)   Resp (!) 29   Ht 5\' 9"  (1.753 m)   Wt 246 lb 11.1 oz (111.9 kg)   SpO2 100%   BMI 36.43 kg/m   GEN: No  acute distress.   Neck: No  JVD Cardiac: Irregular RR, no murmurs, rubs, or gallops.   A/P:  Plan is to consider an elective cath after further discussion with his oncologist about his prognosis and bleeding risk with DAPT.  No acute symptoms this morning.  I will increase his amiodarone back to 60mg /hour.  We will make an early disposition about the need for cath.

## 2017-08-04 ENCOUNTER — Inpatient Hospital Stay (HOSPITAL_COMMUNITY): Payer: Medicare Other

## 2017-08-04 DIAGNOSIS — C3491 Malignant neoplasm of unspecified part of right bronchus or lung: Secondary | ICD-10-CM

## 2017-08-04 DIAGNOSIS — J9601 Acute respiratory failure with hypoxia: Secondary | ICD-10-CM

## 2017-08-04 LAB — POCT I-STAT 3, ART BLOOD GAS (G3+)
Acid-base deficit: 12 mmol/L — ABNORMAL HIGH (ref 0.0–2.0)
Bicarbonate: 13.4 mmol/L — ABNORMAL LOW (ref 20.0–28.0)
O2 Saturation: 99 %
Patient temperature: 98.5
TCO2: 14 mmol/L — ABNORMAL LOW (ref 22–32)
pCO2 arterial: 29.2 mmHg — ABNORMAL LOW (ref 32.0–48.0)
pH, Arterial: 7.271 — ABNORMAL LOW (ref 7.350–7.450)
pO2, Arterial: 182 mmHg — ABNORMAL HIGH (ref 83.0–108.0)

## 2017-08-04 LAB — CBC
HCT: 28.1 % — ABNORMAL LOW (ref 39.0–52.0)
HEMOGLOBIN: 9.8 g/dL — AB (ref 13.0–17.0)
MCH: 30.1 pg (ref 26.0–34.0)
MCHC: 34.9 g/dL (ref 30.0–36.0)
MCV: 86.2 fL (ref 78.0–100.0)
Platelets: 113 10*3/uL — ABNORMAL LOW (ref 150–400)
RBC: 3.26 MIL/uL — AB (ref 4.22–5.81)
RDW: 16.4 % — ABNORMAL HIGH (ref 11.5–15.5)
WBC: 1.4 10*3/uL — CL (ref 4.0–10.5)

## 2017-08-04 LAB — HEPARIN LEVEL (UNFRACTIONATED): HEPARIN UNFRACTIONATED: 0.39 [IU]/mL (ref 0.30–0.70)

## 2017-08-04 LAB — LACTIC ACID, PLASMA
LACTIC ACID, VENOUS: 0.9 mmol/L (ref 0.5–1.9)
Lactic Acid, Venous: 1.6 mmol/L (ref 0.5–1.9)

## 2017-08-04 LAB — ECHOCARDIOGRAM COMPLETE
Height: 69 in
Weight: 3947.12 oz

## 2017-08-04 MED ORDER — AMIODARONE LOAD VIA INFUSION
150.0000 mg | Freq: Once | INTRAVENOUS | Status: AC
  Administered 2017-08-04: 150 mg via INTRAVENOUS

## 2017-08-04 MED ORDER — FUROSEMIDE 10 MG/ML IJ SOLN
INTRAMUSCULAR | Status: AC
  Administered 2017-08-04: 80 mg via INTRAVENOUS
  Filled 2017-08-04: qty 4

## 2017-08-04 MED ORDER — FUROSEMIDE 10 MG/ML IJ SOLN
80.0000 mg | Freq: Once | INTRAMUSCULAR | Status: AC
  Administered 2017-08-04: 80 mg via INTRAVENOUS

## 2017-08-04 MED ORDER — PIPERACILLIN-TAZOBACTAM 3.375 G IVPB
3.3750 g | Freq: Three times a day (TID) | INTRAVENOUS | Status: DC
  Administered 2017-08-04 – 2017-08-07 (×8): 3.375 g via INTRAVENOUS
  Filled 2017-08-04 (×9): qty 50

## 2017-08-04 MED ORDER — PIPERACILLIN-TAZOBACTAM 3.375 G IVPB 30 MIN
3.3750 g | Freq: Once | INTRAVENOUS | Status: AC
  Administered 2017-08-04: 3.375 g via INTRAVENOUS
  Filled 2017-08-04: qty 50

## 2017-08-04 MED ORDER — PHENYLEPHRINE HCL 10 MG/ML IJ SOLN
0.0000 ug/min | INTRAMUSCULAR | Status: DC
  Administered 2017-08-04: 20 ug/min via INTRAVENOUS
  Administered 2017-08-04: 35 ug/min via INTRAVENOUS
  Filled 2017-08-04 (×2): qty 1

## 2017-08-04 MED ORDER — FUROSEMIDE 10 MG/ML IJ SOLN
INTRAMUSCULAR | Status: AC
  Filled 2017-08-04: qty 4

## 2017-08-04 NOTE — Progress Notes (Addendum)
Progress Note  Patient Name: Dakota Shannon Date of Encounter: 08/04/2017  Primary Cardiologist: Dr. Domenic Polite  Subjective    Much more dyspneic today. Wearing NRB on 12L. Says " I cant take this any more!" No CP. Back in sinus on amio.    Inpatient Medications    Scheduled Meds: . Chlorhexidine Gluconate Cloth  6 each Topical Daily  . feeding supplement (ENSURE ENLIVE)  237 mL Oral BID BM  . folic acid  0.5 mg Oral Daily  . mouth rinse  15 mL Mouth Rinse BID  . sodium chloride flush  10-40 mL Intracatheter Q12H   Continuous Infusions: . amiodarone 60 mg/hr (08/04/17 1156)  . heparin 1,100 Units/hr (08/04/17 1156)   PRN Meds: acetaminophen, ALPRAZolam, levalbuterol, lidocaine, morphine injection, ondansetron (ZOFRAN) IV, sodium chloride flush, zolpidem   Vital Signs    Vitals:   08/04/17 1110 08/04/17 1125 08/04/17 1145 08/04/17 1200  BP:  (!) 94/52  (!) 87/61  Pulse: (!) 118 (!) 119  (!) 113  Resp: (!) 30 (!) 38  (!) 27  Temp:   (!) 96.7 F (35.9 C)   TempSrc:   Oral   SpO2: (!) 84% 95%  92%  Weight:      Height:        Intake/Output Summary (Last 24 hours) at 08/04/17 1208 Last data filed at 08/04/17 9518  Gross per 24 hour  Intake          1271.92 ml  Output              625 ml  Net           646.92 ml   Filed Weights   07/29/2017 1632  Weight: 111.9 kg (246 lb 11.1 oz)    Telemetry    AF with RVR Personally reviewed   ECG    A-fib 133 BPM, negative T waves and ST depressions in the inferior and anterolateral leads - Personally Reviewed  Physical Exam   General:  Ill appearing very dyspneic HEENT: wearing FM Neck: supple.JVP 8-9 Carotids 2+ bilat; no bruits. No lymphadenopathy or thryomegaly appreciated. Cor: PMI nondisplaced. Tachy regular.  Lungs: diffuse rhonchi  Abdomen: soft, nontender, nondistended. No hepatosplenomegaly. No bruits or masses. Good bowel sounds. Extremities: no cyanosis, clubbing, rash, tr edema Neuro: alert &  orientedx3, cranial nerves grossly intact. moves all 4 extremities w/o difficulty. Affect distressed   Labs    Chemistry  Recent Labs Lab 07/17/2017 1845 08/03/17 0434  NA 141 141  K 4.0 3.8  CL 120* 120*  CO2 13* 17*  GLUCOSE 123* 113*  BUN 29* 29*  CREATININE 1.29* 1.37*  CALCIUM 8.5* 8.4*  PROT 5.5*  --   ALBUMIN 3.1*  --   AST 68*  --   ALT 47  --   ALKPHOS 57  --   BILITOT 0.8  --   GFRNONAA 51* 47*  GFRAA 59* 55*  ANIONGAP 8 4*     Hematology  Recent Labs Lab 07/29/2017 1845 08/03/17 0434 08/04/17 0432  WBC 1.8* 2.2* 1.4*  RBC 3.51* 3.49* 3.26*  HGB 10.2* 10.1* 9.8*  HCT 30.2* 30.0* 28.1*  MCV 86.0 86.0 86.2  MCH 29.1 28.9 30.1  MCHC 33.8 33.7 34.9  RDW 15.6* 15.8* 16.4*  PLT 106* 122* 113*    Cardiac Enzymes  Recent Labs Lab 07/31/2017 1845 08/03/17 0115 08/03/17 0434  TROPONINI 6.48* 15.68* 18.30*   No results for input(s): TROPIPOC in the last 168 hours.  BNPNo results for input(s): BNP, PROBNP in the last 168 hours.   DDimer No results for input(s): DDIMER in the last 168 hours.   Radiology    Dg Chest Port 1 View  Result Date: 08/03/2017 CLINICAL DATA:  82 year old male with shortness of breath. Hodgkin lymphoma. EXAM: PORTABLE CHEST 1 VIEW COMPARISON:  PET-CT 04/30/2017.  Portable chest 06/15/2017. FINDINGS: Portable AP semi upright view at 1726 hours. Stable right chest porta cath, currently accessed. Stable cardiac size and mediastinal contours. Stable lung volumes. Mild streaky opacity at the right lung base is new. Lung parenchyma elsewhere appears stable. No pulmonary edema, pneumothorax or pleural effusion. Visualized tracheal air column is within normal limits. IMPRESSION: New streaky right lung base opacity. This is nonspecific, but consider developing bronchopneumonia. Electronically Signed   By: Genevie Ann M.D.   On: 08/03/2017 17:46    Cardiac Studies   TTE pending  Patient Profile     82 y.o. male with a history of HTN, HLD,  hodgkin's lympoma, stage IV RUL nonsmall cell lung cancer with 96m L frontal metastasis, aortic root dilation (4cm on last echo), h/o asymptomatic Wolff-Parkinson-White syndrome and history of atrial fibrillation admitted with recurrent AF and NSTEMI   Assessment & Plan    1. Acute hypoxemic respiratory failure - he is critically ill with sats in 70s on NRB.  - CXR reviewed personally looks like HF but may also have RLL infiltrate in setting of neutropenia - Long talk with him regarding intubation vs comfort care - Says he would consider intubation if we feel we can get him off ventilator - I spoke with Dr. BLamonte Sakaiin CCM who agrees that he is at high risk for not being able to wean from ventilator - Will place BIPAP, start IV lasix and broad spectrum abx - I will speak to his daughter further about [place] 2. Atrial fibrillation with RVR - now in sinus tach on amio. Will continue - would stop heparin with brain mets.  3. NSTEMI - not candidate for DAPT due to high risk of bleeding from brain met per oncology  - treat medically with ASA/statin  - no b-blocker due to low BP and respiratory failure -echo pending  4. 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 BLonia Chimeraoverall prognosis of survival in the next 12 months < 50%.  5. Neutropenia - WBC 1.3.  - With hypotension and respiratory failure will culture and start broad spectrum abx  6. Hypotension - support with neosynephrine as needed.    CRITICAL CARE Performed by: BGlori Bickers Total critical care time: 55 minutes  Critical care time was exclusive of separately billable procedures and treating other patients.  Critical care was necessary to treat or prevent imminent or life-threatening deterioration.  Critical care was time spent personally by me (independent of midlevel providers or residents) on the following activities:  development of treatment plan with patient and/or surrogate as well as nursing, discussions with consultants, evaluation of patient's response to treatment, examination of patient, obtaining history from patient or surrogate, ordering and performing treatments and interventions, ordering and review of laboratory studies, ordering and review of radiographic studies, pulse oximetry and re-evaluation of patient's condition.   STreasa School MD  08/04/2017, 12:08 PM

## 2017-08-04 NOTE — Progress Notes (Signed)
[place] for Heparin Indication: atrial fibrillation   Allergies  Allergen Reactions  . No Known Allergies     Patient Measurements: Height: 5\' 9"  (175.3 cm) Weight: 246 lb 11.1 oz (111.9 kg) IBW/kg (Calculated) : 70.7 Heparin Dosing Weight: 95.4 kg  Vital Signs: Temp: 98.2 F (36.8 C) (09/29 0323) Temp Source: Oral (09/29 0323) BP: 95/67 (09/29 0700) Pulse Rate: 96 (09/29 0700)  Labs:  Recent Labs  07/10/2017 1845 08/03/17 0115 08/03/17 0434 08/03/17 1401 08/04/17 0432  HGB 10.2*  --  10.1*  --  9.8*  HCT 30.2*  --  30.0*  --  28.1*  PLT 106*  --  122*  --  113*  HEPARINUNFRC  --   --  0.95* 0.67 0.39  CREATININE 1.29*  --  1.37*  --   --   TROPONINI 6.48* 15.68* 18.30*  --   --     Estimated Creatinine Clearance: 53 mL/min (A) (by C-G formula based on SCr of 1.37 mg/dL (H)).   Medical History: Past Medical History:  Diagnosis Date  . Adenocarcinoma of lung [facility])    Resected March 2016  . Arthritis   . Atrial fibrillation ([place])    Possible history based on limited information  . Complication of anesthesia    difficulty urinating  . COPD (chronic obstructive pulmonary disease) ([place])   . Diverticulosis   . Essential hypertension, benign   . Hodgkin's lymphoma ([place])   . Mixed hyperlipidemia   . Swallowing difficulty   . Wolff-Parkinson-White (WPW) syndrome    Possible history based on limited information   Assessment: 82 y.o male with h/o with a solitary left frontal 6 mm brain metastasis from non-small cell cancer of the right upper lung. Admitted to [facility]  07/15/2017 for Afib with RVR.  Pharmacy consulted for IV heparin dosing for Afib. He was not on anticoagulation PTA.  H/o bloody stools noted in [place].   06/27/17-present: Currently undergoing concurrent chemotherapy with weekly Taxol/Carboplatin and radiotherapy to the RUL. Patient states last chemo treatment on Wednesday 9/19 with last radiation treatment Monday and  Tuesday 9/24 and 9/25.   Heparin level 0.39 after adjustment yesterday. Slight epistaxis noted yesterday appears resolved. Will monitor very closely for now low heparin level should help. Hgb stable, plt low stable at 113.  Goal of Therapy:  Heparin level 0.3-0.7 units/ml Monitor platelets by anticoagulation protocol: Yes   Plan:  Continue heparin at 1100 units/hr Daily Heparin level and CBC    Erin Hearing PharmD., BCPS Clinical Pharmacist Pager [phone] 08/04/2017 7:47 AM

## 2017-08-04 NOTE — Plan of Care (Signed)
Problem: Activity: Goal: Ability to tolerate increased activity will improve Outcome: Not Progressing Pt SOB at rest placed on bipap

## 2017-08-04 NOTE — Plan of Care (Signed)
Problem: Pain Managment: Goal: General experience of comfort will improve Outcome: Progressing Pt denies any pain this shift.   Problem: Bowel/Gastric: Goal: Will not experience complications related to bowel motility Outcome: Progressing Patient had a BM this morning.   Problem: Activity: Goal: Ability to tolerate increased activity will improve Outcome: Not Progressing Patient experienced respiratory distress getting to [facility] with day shift RN yesterday. Symptoms resolved after a period of rest, but pt discouraged from getting OOB at this time due to poor response with activity yesterday. Will progress as tolerated.   Problem: Cardiac: Goal: Ability to achieve and maintain adequate cardiopulmonary perfusion will improve Outcome: Progressing Patient has remained in NSR 1st degree HB this shift on amio gtt. Remains on heparin gtt. No s/sx of bleeding.

## 2017-08-04 NOTE — Progress Notes (Signed)
Pt continuously coughing after eating breakfast and having BM. Pt states "I cant swallow my spit" pt has had issues with swallowing due to radiation/chemo to the area. Pt has increased work of breathing. o2 sats upper 80's HR 110's RR 30's. Pt o2  increased to 4L then to 6L with minimal effect. Pt placed on non rebreather at 12L sats increased to 94% and RR 24.SBP 90-100's Will update MD about situation.

## 2017-08-04 NOTE — Progress Notes (Addendum)
  Patient remains very tenuous.   Respiratory status improved on Bipap.  ABG obtained shows metabolic acidosis likely due to sepsis/PNA. PaO2 182. Lactic acid ok. Now on broad spectrum abx for neutropenia.   Echo reviewed personally EF ~35%. 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   Now on Neosynephrine for BP support. With MAPs in 108s. Can add levophed as needed.   Discussed with Dr. Lamonte Sakai in [place]. [place] continue current support for now.   Remains full code currently. Palliative Care consulted.   Additional 35 mins CCT.  Glori Bickers, MD  3:49 PM

## 2017-08-04 NOTE — Progress Notes (Signed)
RT attempted RR a-line x 2 attempts with no success. MD made aware and will attempt to place line. RT will continue to monitor.

## 2017-08-04 NOTE — Procedures (Signed)
Arterial line Insertion Procedure Note Dakota Shannon [id number] July 16, 1935  Procedure: Insertion of Arterial Lin  Indications: Frequent blood sampling  Procedure Details Consent: Unable to obtain consent because of altered level of consciousness. Time Out: Verified patient identification, verified procedure, site/side was marked, verified correct patient position, special equipment/implants available, medications/allergies/relevent history reviewed, required imaging and test results available.  Performed  Maximum sterile technique was used including antiseptics, cap, gloves, gown, hand hygiene, mask and sheet. Skin prep: Chlorhexidine; local anesthetic administered A antimicrobial bonded/coated single lumen catheter was placed in the right radial artery using u/s guidance and the Seldinger technique.  Evaluation Blood flow good Complications: No apparent complications Patient did tolerate procedure well.    Glori Bickers MD 08/04/2017, 3:44 PM

## 2017-08-04 NOTE — Progress Notes (Signed)
RT called to pt's room for increased WOB/SOB. Pt on NRB but was taken off and placed on Bipap per Dr Haroldine Laws. Ipap/epap adjusted for pt comfort and VS. Spo2 improved from 79-97%. Fio2 decreased from 100-80%. RN aware. Pt resting comfortably with decrease in WOB upon bipap being placed. RT will continue to closely monitor pt

## 2017-08-04 NOTE — Progress Notes (Signed)
  Had another discussion with patient and his daughter Anderson Malta (his POA).   Want to continue aggressive measures for now but would not want heroic measures. No intubation, shock or CPR. Vasopressors and Bipap ok.   Glori Bickers, MD  4:02 PM

## 2017-08-04 NOTE — Plan of Care (Signed)
Problem: Cardiac: Goal: Ability to achieve and maintain adequate cardiopulmonary perfusion will improve Outcome: Not Progressing Pt flipped back in afib on amio gtt BP drop pressor added

## 2017-08-04 NOTE — Progress Notes (Signed)
  Echocardiogram 2D Echocardiogram has been performed.  Dakota Shannon T [name] 08/04/2017, 9:10 AM

## 2017-08-05 ENCOUNTER — Inpatient Hospital Stay (HOSPITAL_COMMUNITY): Payer: Medicare Other

## 2017-08-05 DIAGNOSIS — I5043 Acute on chronic combined systolic (congestive) and diastolic (congestive) heart failure: Secondary | ICD-10-CM

## 2017-08-05 DIAGNOSIS — Z515 Encounter for palliative care: Secondary | ICD-10-CM

## 2017-08-05 DIAGNOSIS — C3490 Malignant neoplasm of unspecified part of unspecified bronchus or lung: Secondary | ICD-10-CM

## 2017-08-05 DIAGNOSIS — Z7189 Other specified counseling: Secondary | ICD-10-CM

## 2017-08-05 DIAGNOSIS — C7931 Secondary malignant neoplasm of brain: Secondary | ICD-10-CM

## 2017-08-05 LAB — BLOOD GAS, ARTERIAL
ACID-BASE DEFICIT: 12.5 mmol/L — AB (ref 0.0–2.0)
Bicarbonate: 12 mmol/L — ABNORMAL LOW (ref 20.0–28.0)
DELIVERY SYSTEMS: POSITIVE
DRAWN BY: 34762
Expiratory PAP: 8
FIO2: 40
INSPIRATORY PAP: 15
LHR: 10 {breaths}/min
O2 Saturation: 99 %
PH ART: 7.37 (ref 7.350–7.450)
Patient temperature: 97.1
pCO2 arterial: 21 mmHg — ABNORMAL LOW (ref 32.0–48.0)
pO2, Arterial: 172 mmHg — ABNORMAL HIGH (ref 83.0–108.0)

## 2017-08-05 LAB — CBC
HEMATOCRIT: 31.9 % — AB (ref 39.0–52.0)
Hemoglobin: 10.9 g/dL — ABNORMAL LOW (ref 13.0–17.0)
MCH: 29.3 pg (ref 26.0–34.0)
MCHC: 34.2 g/dL (ref 30.0–36.0)
MCV: 85.8 fL (ref 78.0–100.0)
PLATELETS: 164 10*3/uL (ref 150–400)
RBC: 3.72 MIL/uL — ABNORMAL LOW (ref 4.22–5.81)
RDW: 17.2 % — AB (ref 11.5–15.5)
WBC: 2.4 10*3/uL — AB (ref 4.0–10.5)

## 2017-08-05 LAB — BASIC METABOLIC PANEL
Anion gap: 8 (ref 5–15)
BUN: 31 mg/dL — ABNORMAL HIGH (ref 6–20)
CALCIUM: 8.3 mg/dL — AB (ref 8.9–10.3)
CO2: 16 mmol/L — AB (ref 22–32)
CREATININE: 2.22 mg/dL — AB (ref 0.61–1.24)
Chloride: 116 mmol/L — ABNORMAL HIGH (ref 101–111)
GFR, EST AFRICAN AMERICAN: 30 mL/min — AB (ref >60)
GFR, EST NON AFRICAN AMERICAN: 26 mL/min — AB (ref >60)
Glucose, Bld: 117 mg/dL — ABNORMAL HIGH (ref 65–99)
Potassium: 3.1 mmol/L — ABNORMAL LOW (ref 3.5–5.1)
SODIUM: 140 mmol/L (ref 135–145)

## 2017-08-05 MED ORDER — SODIUM CHLORIDE 0.9 % IV SOLN
230.0000 ug/min | INTRAVENOUS | Status: DC
  Administered 2017-08-05: 40 ug/min via INTRAVENOUS
  Administered 2017-08-05: 30 ug/min via INTRAVENOUS
  Administered 2017-08-07: 230 ug/min via INTRAVENOUS
  Administered 2017-08-07: 120 ug/min via INTRAVENOUS
  Administered 2017-08-07 (×2): 230 ug/min via INTRAVENOUS
  Administered 2017-08-07: 200 ug/min via INTRAVENOUS
  Administered 2017-08-07 – 2017-08-08 (×2): 230 ug/min via INTRAVENOUS
  Filled 2017-08-05 (×11): qty 4

## 2017-08-05 MED ORDER — SODIUM CHLORIDE 0.9 % IV SOLN: INTRAVENOUS | Status: DC | PRN

## 2017-08-05 MED ORDER — ATORVASTATIN CALCIUM 80 MG PO TABS
80.0000 mg | ORAL_TABLET | Freq: Every day | ORAL | Status: DC
  Administered 2017-08-05 – 2017-08-06 (×2): 80 mg via ORAL
  Filled 2017-08-05 (×2): qty 1

## 2017-08-05 MED ORDER — ASPIRIN 81 MG PO CHEW
81.0000 mg | CHEWABLE_TABLET | Freq: Every day | ORAL | Status: DC
  Administered 2017-08-05 – 2017-08-06 (×2): 81 mg via ORAL
  Filled 2017-08-05 (×3): qty 1

## 2017-08-05 MED ORDER — POTASSIUM CHLORIDE CRYS ER 20 MEQ PO TBCR
40.0000 meq | EXTENDED_RELEASE_TABLET | Freq: Once | ORAL | Status: AC
  Administered 2017-08-05: 40 meq via ORAL
  Filled 2017-08-05: qty 2

## 2017-08-05 MED ORDER — AMIODARONE LOAD VIA INFUSION
150.0000 mg | Freq: Once | INTRAVENOUS | Status: AC
  Administered 2017-08-05: 150 mg via INTRAVENOUS
  Filled 2017-08-05: qty 83.34

## 2017-08-05 MED ORDER — AMIODARONE IV BOLUS ONLY 150 MG/100ML
150.0000 mg | Freq: Once | INTRAVENOUS | Status: AC
  Administered 2017-08-05: 150 mg via INTRAVENOUS

## 2017-08-05 NOTE — Progress Notes (Signed)
Pt taken off bipap and placed on 4L [place]. Pt in no distress, no increased WOB, denies SOB. RN aware. RT will continue to monitor for bipap needs.

## 2017-08-05 NOTE — Progress Notes (Signed)
Arterial Line taken out per protocol due to no reading and unable to draw back blood from catheter. Patient pressure monitor had no wave form and no readings, site was undressed and cleaned then redressed with an attempt to save line. But to no success, line remained unusable and was removed. Site cleaned and pressure tape with gauze placed on site. RN aware

## 2017-08-05 NOTE — Progress Notes (Addendum)
Patient ID: Dakota Shannon, male   DOB: 1935/04/27, 82 y.o.   MRN: [id number]   Progress Note  Patient Name: Dakota Shannon Date of Encounter: 08/05/2017  Primary Cardiologist: Dr. Domenic Polite  Subjective   Breathing better today, Bipap overnight but now on NRBM.  He has had no chest pain since the day he came to the hospital.  He got IV Lasix yesterday.  I/Os appear even but CXR shows less edema.   Creatinine up to 2.22 today.    He is back in atrial fibrillation today with HR 130s on amiodarone 30 mg/hr.    He lost his arterial line.  SBP in 80-90s on phenylephrine 55.   Echo: EF 35-40%, mid-apical anteroseptal and anterior akinesis, akinesis of the true apex.    Inpatient Medications    Scheduled Meds: . amiodarone  150 mg Intravenous Once  . Chlorhexidine Gluconate Cloth  6 each Topical Daily  . feeding supplement (ENSURE ENLIVE)  237 mL Oral BID BM  . folic acid  0.5 mg Oral Daily  . mouth rinse  15 mL Mouth Rinse BID  . sodium chloride flush  10-40 mL Intracatheter Q12H   Continuous Infusions: . amiodarone 30 mg/hr (08/05/17 0800)  . phenylephrine (NEO-SYNEPHRINE) Adult infusion 55 mcg/min (08/05/17 0800)  . piperacillin-tazobactam (ZOSYN)  IV 3.375 g (08/05/17 0542)   PRN Meds: acetaminophen, ALPRAZolam, levalbuterol, lidocaine, morphine injection, ondansetron (ZOFRAN) IV, sodium chloride flush, zolpidem   Vital Signs    Vitals:   08/05/17 0745 08/05/17 0800 08/05/17 0815 08/05/17 0831  BP: (!) 79/27 (!) 84/66 (!) 83/64   Pulse: (!) 128 (!) 127 (!) 135   Resp: (!) 22 (!) 22 (!) 22   Temp:  98.5 F (36.9 C)  99.1 F (37.3 C)  TempSrc:  Oral  Oral  SpO2: 99% 99% 100%   Weight:      Height:        Intake/Output Summary (Last 24 hours) at 08/05/17 0849 Last data filed at 08/05/17 0800  Gross per 24 hour  Intake           1450.9 ml  Output             1175 ml  Net            275.9 ml   Filed Weights   07/30/2017 1632 08/05/17 0600  Weight: 246 lb 11.1  oz (111.9 kg) 253 lb 8.5 oz (115 kg)    Telemetry    AF with RVR in 130s, personally reviewed   ECG    A-fib 133 BPM, negative T waves and ST depressions in the inferior and anterolateral leads - Personally Reviewed  Physical Exam   General: NAD, on nasal cannula Neck: Thick, JVP around 8 cm, no thyromegaly or thyroid nodule.  Lungs: Dependent crackles CV: Nondisplaced PMI.  Heart tachy, iiregular S1/S2, no S3/S4, no murmur.  Trace ankle edema.   Abdomen: Soft, nontender, no hepatosplenomegaly, no distention.  Skin: Intact without lesions or rashes.  Neurologic: Alert and oriented x 3.  Psych: Normal affect. Extremities: No clubbing or cyanosis.  HEENT: Normal.   Labs    Chemistry  Recent Labs Lab 08/05/2017 1845 08/03/17 0434 08/05/17 0348  NA 141 141 140  K 4.0 3.8 3.1*  CL 120* 120* 116*  CO2 13* 17* 16*  GLUCOSE 123* 113* 117*  BUN 29* 29* 31*  CREATININE 1.29* 1.37* 2.22*  CALCIUM 8.5* 8.4* 8.3*  PROT 5.5*  --   --  ALBUMIN 3.1*  --   --   AST 68*  --   --   ALT 47  --   --   ALKPHOS 57  --   --   BILITOT 0.8  --   --   GFRNONAA 51* 47* 26*  GFRAA 59* 55* 30*  ANIONGAP 8 4* 8     Hematology  Recent Labs Lab 08/03/17 0434 08/04/17 0432 08/05/17 0348  WBC 2.2* 1.4* 2.4*  RBC 3.49* 3.26* 3.72*  HGB 10.1* 9.8* 10.9*  HCT 30.0* 28.1* 31.9*  MCV 86.0 86.2 85.8  MCH 28.9 30.1 29.3  MCHC 33.7 34.9 34.2  RDW 15.8* 16.4* 17.2*  PLT 122* 113* 164    Cardiac Enzymes  Recent Labs Lab 08/01/2017 1845 08/03/17 0115 08/03/17 0434  TROPONINI 6.48* 15.68* 18.30*   No results for input(s): TROPIPOC in the last 168 hours.   BNPNo results for input(s): BNP, PROBNP in the last 168 hours.   DDimer No results for input(s): DDIMER in the last 168 hours.   Radiology    Dg Chest Port 1 View  Result Date: 08/05/2017 CLINICAL DATA:  CHF EXAM: PORTABLE CHEST 1 VIEW COMPARISON:  08/04/2017 and prior studies FINDINGS: Cardiomegaly and right Port-A-Cath with  tip overlying the upper SVC again noted. Near complete resolution of bilateral interstitial and airspace opacities/edema noted. There is no evidence of airspace disease, pleural effusion or pneumothorax. IMPRESSION: Near complete resolution of bilateral interstitial and airspace opacities/edema. Electronically Signed   By: Margarette Canada M.D.   On: 08/05/2017 07:46   Dg Chest Port 1 View  Result Date: 08/04/2017 CLINICAL DATA:  Shortness of breath. History of cancer and hypertension. EXAM: PORTABLE CHEST 1 VIEW COMPARISON:  08/03/2017 FINDINGS: Right-sided PowerPort tip overlying the superior vena cava. The heart is mildly enlarged. There are increasing bilateral airspace filling opacities primarily at the bases. Opacities are asymmetric, right greater than left, compatible with asymmetric pulmonary edema or infectious infiltrates. IMPRESSION: Increasing bilateral infiltrates right greater than left. Electronically Signed   By: Nolon Nations M.D.   On: 08/04/2017 12:41   Dg Chest Port 1 View  Result Date: 08/03/2017 CLINICAL DATA:  82 year old male with shortness of breath. Hodgkin lymphoma. EXAM: PORTABLE CHEST 1 VIEW COMPARISON:  PET-CT 04/30/2017.  Portable chest 06/15/2017. FINDINGS: Portable AP semi upright view at 1726 hours. Stable right chest porta cath, currently accessed. Stable cardiac size and mediastinal contours. Stable lung volumes. Mild streaky opacity at the right lung base is new. Lung parenchyma elsewhere appears stable. No pulmonary edema, pneumothorax or pleural effusion. Visualized tracheal air column is within normal limits. IMPRESSION: New streaky right lung base opacity. This is nonspecific, but consider developing bronchopneumonia. Electronically Signed   By: Genevie Ann M.D.   On: 08/03/2017 17:46    Cardiac Studies   TTE pending  Patient Profile     82 y.o. male with a history of HTN, HLD, hodgkin's lympoma, stage IV RUL nonsmall cell lung cancer with 62m L frontal  metastasis, aortic root dilation (4cm on last echo), h/o asymptomatic Wolff-Parkinson-White syndrome and history of atrial fibrillation admitted with recurrent AF and NSTEMI   Assessment & Plan    1. Acute hypoxemic respiratory failure: CXR has looked like HF but may also have RLL infiltrate in setting of neutropenia.  He was on Bipap overnight, now on [place].  Got IV Lasix yesterday, recorded UOP not marked but CXR shows decreased edema.   - He is DNR/DNI currently.  - Zosyn =>  broad spectrum abx with ?PNA.  Pending culture data.  - He does not looks volume overloaded by exam today and creatinine is up.  Will follow CVP prior to giving additional Lasix.  - Consult Palliative Care 2. Atrial fibrillation with RVR: Back in atrial fibrillation with RVR today.   - Increase amiodarone to 60 mg/hr for now.  - Heparin stopped b/c of brain mets.  3. NSTEMI: Troponin to 18.  Suspect LAD infarct based on WMAs on echo.  Not candidate for DAPT due to high risk of bleeding from brain met per oncology.  - Treat medically with ASA/statin  - no b-blocker due to low BP and respiratory failure 4. Stage IV lung cancer with metastasis to the brain: s/p recent chemo/XRT.  Per his oncologist, Dr. Lonia Chimera overall prognosis of survival in the next 12 months < 50%. 5. Neutropenia: With hypotension and respiratory failure, he was cultured and vancomycin/Zosyn begun.  Afebrile currently.  CXR with some clearing so may have been predominantly pulmonary edema.  6. Acute systolic CHF: EF 11-15% on echo, suspect ischemic cardiomyopathy with NSTEMI prior to admission.  Got Lasix yesterday, CXR better today and JVP not clearly elevated.  AKI with creatinine to 2.2.  - Transduce CVP off central access.  Will hold off on IV Lasix today unless CVP high.  - He remains on phenylephrine with hypotension.  BP difficult with lack of arterial access (lost this morning).  Replace arterial line. Can add norepinephrine if needed.  7. AKI:  Creatinine up to 2.22.  As above, holding diuresis for now.   Patient is DNR/DNI currently. Overall, poor prognosis and remains critically ill.   CRITICAL CARE Performed by: Loralie Champagne  Total critical care time: 35 minutes  Critical care time was exclusive of separately billable procedures and treating other patients.  Critical care was necessary to treat or prevent imminent or life-threatening deterioration.  Critical care was time spent personally by me (independent of midlevel providers or residents) on the following activities: development of treatment plan with patient and/or surrogate as well as nursing, discussions with consultants, evaluation of patient's response to treatment, examination of patient, obtaining history from patient or surrogate, ordering and performing treatments and interventions, ordering and review of laboratory studies, ordering and review of radiographic studies, pulse oximetry and re-evaluation of patient's condition.  Signed, Loralie Champagne, MD  08/05/2017, 8:49 AM

## 2017-08-05 NOTE — Evaluation (Signed)
Clinical/Bedside Swallow Evaluation Patient Details  Name: Dakota Shannon MRN: [id number] Date of Birth: 09-11-1935  Today's Date: 08/05/2017 Time: SLP Start Time (ACUTE ONLY): 6712 SLP Stop Time (ACUTE ONLY): 1635 SLP Time Calculation (min) (ACUTE ONLY): 15 min  Past Medical History:  Past Medical History:  Diagnosis Date  . Adenocarcinoma of lung [facility])    Resected March 2016  . Arthritis   . Atrial fibrillation ([place])    Possible history based on limited information  . Complication of anesthesia    difficulty urinating  . COPD (chronic obstructive pulmonary disease) ([place])   . Diverticulosis   . Essential hypertension, benign   . Hodgkin's lymphoma ([place])   . Mixed hyperlipidemia   . Swallowing difficulty   . Wolff-Parkinson-White (WPW) syndrome    Possible history based on limited information   Past Surgical History:  Past Surgical History:  Procedure Laterality Date  . CATARACT EXTRACTION    . COLONOSCOPY    . ESOPHAGOGASTRODUODENOSCOPY N/A 06/08/2017   Procedure: ESOPHAGOGASTRODUODENOSCOPY (EGD);  Surgeon: Danie Binder, MD;  [facility];  Service: Endoscopy;  Laterality: N/A;  2:30pm  . MEDIASTINOSCOPY N/A 05/18/2017   Procedure: MEDIASTINOSCOPY;  Surgeon: Grace Isaac, MD;  Location: [facility] OR;  Service: Thoracic;  Laterality: N/A;  . NECK LESION BIOPSY    . SAVORY DILATION N/A 06/08/2017   Procedure: SAVORY DILATION;  Surgeon: Danie Binder, MD;  Location[facility];  Service: Endoscopy;  Laterality: N/A;  . SKIN CANCER EXCISION    . VIDEO ASSISTED THORACOSCOPY (VATS)/WEDGE RESECTION Right 01/26/2015   Procedure: RIGHT VIDEO ASSISTED THORACOSCOPY (VATS)WEDGE RESECTION OF RIGHT UPPER LOBE, LYMPH NODE DISSECTION;  Surgeon: Grace Isaac, MD;  Location: [place];  Service: Thoracic;  Laterality: Right;  Marland Kitchen VIDEO BRONCHOSCOPY N/A 01/26/2015   Procedure: VIDEO BRONCHOSCOPY;  Surgeon: Grace Isaac, MD;  Location: [place];  Service: Thoracic;  Laterality:  N/A;  . VIDEO BRONCHOSCOPY WITH ENDOBRONCHIAL ULTRASOUND N/A 05/18/2017   Procedure: VIDEO BRONCHOSCOPY WITH ENDOBRONCHIAL ULTRASOUND;  Surgeon: Grace Isaac, MD;  Location: [place];  Service: Thoracic;  Laterality: N/A;   HPI:  82 y.o.malewith a history of HTN, HLD, hodgkin's lymphoma, stage IV RUL nonsmall cell lung cancer with 62mm L frontal metastasis (undergoing chemo and radiation tx), aortic root dilation (4cm on last echo), h/o asymptomatic Wolff-Parkinson-White syndrome and history of atrial fibrillation, admitted with afib with RVR, dehydration, elevated troponin, acute hypoxic resp failure with fluctuating saturations. Per MD notes, CXR has looked like HF but may also have RLL infiltrate in setting of neutropenia.  Has decided against intubation.  Overall prognosis of survival next 12 months < 50%  per MD discussion with oncology.  Critically ill.  Per notes from [facility], pt with long-standing solid food dysphagia; barium swallow completed with recs for endoscopy; underwent dilatation in Aug prior to beginning chemo/rad per pt's dtr.   Assessment / Plan / Recommendation Clinical Impression  Pt evaluated at bedside for dysphagia. Oxygenating well on four liters [place] this afternoon.  Consumed limited sips of thin liquids, ice chips, purees with no overt s/s of aspiration; no focal deficits; no c/o globus. Pt demonstrates immediate exhalation post-swallow for all consistencies.  Appears to be protecting his airway with limited PO consumption.  C/O odynophagia at level of larynx (for one week). Given current medical condition, recommend continuing with ice chips/sips of water tonight, meds crushed in puree.  SLP will f/u next date to determine readiness to advance diet,  impact of respiratory status on overall swallowing. D/W RN.  SLP Visit Diagnosis: Dysphagia, unspecified (R13.10)    Aspiration Risk       Diet Recommendation   sips/chips; meds crushed in puree  Medication  Administration: Crushed with puree    Other  Recommendations Oral Care Recommendations: Oral care QID   Follow up Recommendations        Frequency and Duration min 2x/week  2 weeks       Prognosis Prognosis for Safe Diet Advancement: Good      Swallow Study   General HPI: 82 y.o.malewith a history of HTN, HLD, hodgkin's lymphoma, stage IV RUL nonsmall cell lung cancer with 13mm L frontal metastasis (undergoing chemo and radiation tx), aortic root dilation (4cm on last echo), h/o asymptomatic Wolff-Parkinson-White syndrome and history of atrial fibrillation, admitted with afib with RVR, dehydration, elevated troponin, acute hypoxic resp failure with fluctuating saturations. Per MD notes, CXR has looked like HF but may also have RLL infiltrate in setting of neutropenia.  Has decided against intubation.  Overall prognosis of survival next 12 months < 50%  per MD discussion with oncology.  Critically ill.  Per notes from [facility], pt with long-standing solid food dysphagia; barium swallow completed with recs for endoscopy; underwent dilatation in Aug prior to beginning chemo/rad per pt's dtr. Type of Study: Bedside Swallow Evaluation Diet Prior to this Study: Other (Comment) (sips and chips only) Temperature Spikes Noted: No Respiratory Status: Nasal cannula (4 liters) History of Recent Intubation: No Behavior/Cognition: Alert;Cooperative Oral Cavity Assessment: Within Functional Limits Oral Care Completed by SLP: No Oral Cavity - Dentition: Adequate natural dentition Vision: Functional for self-feeding Self-Feeding Abilities: Able to feed self Patient Positioning: Upright in bed Baseline Vocal Quality: Normal    Oral/Motor/Sensory Function Overall Oral Motor/Sensory Function: Within functional limits   Ice Chips Ice chips: Within functional limits   Thin Liquid Thin Liquid: Within functional limits    Nectar Thick Nectar Thick Liquid: Not tested   Honey Thick Honey Thick  Liquid: Not tested   Puree Puree: Within functional limits   Solid   GO   Solid: Not tested        Juan Quam Laurice 08/05/2017,4:42 PM Estill Bamberg L. Tivis Ringer, [place] CCC/SLP Pager [phone]

## 2017-08-05 NOTE — Consult Note (Signed)
Consultation Note Date: 08/05/2017   Patient Name: Dakota Shannon  DOB: 1935-10-19  MRN: [id number]  Age / Sex: 82 y.o., male  PCP: [name], MD Referring Physician: Dorothy Spark, MD  Reason for Consultation: Establishing goals of care  HPI/Patient Profile: 82 y.o. 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 Hospitalization complicated by inability to receive anticoagulation d/t recently radiated brain mets, continued afib, CHF exacerbation requiring BiPAP.   Clinical Assessment and Goals of Care: I met today with Mr. Polinski along with his daughter, Dakota Shannon, and son-in-law, Dakota Shannon at bedside. Mr. Popoff main concern at this time is about his swallowing and seems to have some burning and discomfort in swallowing s/t recent radiation. Discussed use of SLP evalution and avoiding spicy, acidic, salty foods and focusing on more soft and bland diet. Difficult for him to describe but seems to be more discomfort than actual functional swallowing/aspiration risk but he says he struggles with both.   I attempted to discuss with Mr. Havens his complex situation with NSTEMI, Atrial fibrillation, hypotension, and risk of not able to use anticoagulation. Also discussed concern of heart and renal function balance requiring BiPAP. All of these complications on top or recent radiation and chemotherapy for stage IV lung cancer with brain mets. Unfortunately Mr. Hagmann struggles with the severity of his conditions and expectations. He tells me that he hopes to "think positive" and is hopeful that he will improve. I tried to reiterate the barriers to improvement. I believe he felt much worse yesterday so he sees today as  he is improving and getting well. Difficult to focus him at times as he continues to speak of his swallowing and eating in saying "if I can't eat I can't get better."  I spoke privately with Dakota Shannon and Dakota Shannon when Mr. Zalewski needed to use the restroom. Dakota Shannon has better understanding of how critical her father's health is and recognizes that he is likely at EOL. After much discussion regarding his cancer and goals of treatment Dakota Shannon shares that Mr. Nocera and his family were under the impression that his cancer was curable with his treatment. I explained the goal of treatment as palliative in nature with the hope of giving him more time and slowing down the progression of cancer. Dakota Shannon states that she would not have wanted for him to proceed with chemo/radiation if they had known it was not curable.   I will speak again tomorrow with Mr. Cheramie to help him reevaluate [place] with clarity of his terminal diagnoses. Dakota Shannon was appreciate and agrees with continued conversation with her father and is hopeful that he will be able to make his own decisions. She would be supportive of comfort focused care.   Primary Decision Maker PATIENT    SUMMARY OF RECOMMENDATIONS   - DNR (ok with BiPAP and vasopressors) - Patient has  poor understanding of severity of illness - will need continued conversation - Would not want feeding tube  Code Status/Advance Care Planning:  DNR with BiPAP and pressors okay   Symptom Management:   Dyspnea: Improved today. Continue diuresis treatment per cardiology.   Throat irritation/swallowing difficulty: Encouraged better diet choices with soft bland foods. SLP to see. Consider magic mouthwash rinse. Would avoid numbing mouth rinse as this will not help his appetite and could increase risk of aspiration.   Palliative Prophylaxis:   Aspiration, Delirium Protocol and Frequent Pain Assessment  Additional Recommendations (Limitations, Scope, Preferences):  No  Artificial Feeding, No Chemotherapy and No Tracheostomy, No radiation  Psycho-social/Spiritual:   Desire for further Chaplaincy support:yes  Additional Recommendations: Caregiving  Support/Resources, Education on Hospice and Grief/Bereavement Support  Prognosis:   Prognosis very poor and likely weeks. Eligible for hospice care but I did not discuss this today.   Discharge Planning: To Be Determined      Primary Diagnoses: Present on Admission: . Atrial fibrillation with RVR ([place])   I have reviewed the medical record, interviewed the patient and family, and examined the patient. The following aspects are pertinent.  Past Medical History:  Diagnosis Date  . Adenocarcinoma of lung [facility])    Resected March 2016  . Arthritis   . Atrial fibrillation ([place])    Possible history based on limited information  . Complication of anesthesia    difficulty urinating  . COPD (chronic obstructive pulmonary disease) ([place])   . Diverticulosis   . Essential hypertension, benign   . Hodgkin's lymphoma ([place])   . Mixed hyperlipidemia   . Swallowing difficulty   . Wolff-Parkinson-White (WPW) syndrome    Possible history based on limited information   Social History   Social History  . Marital status: Widowed    Spouse name: N/A  . Number of children: N/A  . Years of education: N/A   Occupational History  . Retired    Social History Main Topics  . Smoking status: Former Smoker    Packs/day: 0.50    Years: 33.00    Types: Cigarettes    Start date: 02/27/1947    Quit date: 11/07/1979  . Smokeless tobacco: Never Used  . Alcohol use No  . Drug use: No  . Sexual activity: Not Asked   Other Topics Concern  . None   Social History Narrative   [facility] (05/04/17):   Originally from [place]. He served in [employer]. He was in [employer] in [place]. He was in [place], [place], & [place]. 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 Then  he retired from [employer]. Has a dog and cats at home. No bird exposure. No mold exposure. Questionable exposure to asbestos.    Family History  Problem Relation Age of Onset  . Diabetes Mother   . Lung cancer Father   . Diabetes Brother   . Lung cancer Brother   . Gastric cancer Maternal Grandfather   . Lung disease Neg Hx   . Colon cancer Neg Hx   . Esophageal cancer Neg Hx    Scheduled Meds: . aspirin  81 mg Oral Daily  . atorvastatin  80 mg Oral q1800  . Chlorhexidine Gluconate Cloth  6 each Topical Daily  . feeding supplement (ENSURE ENLIVE)  237 mL Oral BID BM  . folic acid  0.5 mg Oral Daily  . mouth rinse  15 mL Mouth Rinse BID  .  sodium chloride flush  10-40 mL Intracatheter Q12H   Continuous Infusions: . sodium chloride    . amiodarone 60 mg/hr (08/05/17 1458)  . phenylephrine (NEO-SYNEPHRINE) Adult infusion 30 mcg/min (08/05/17 1444)  . piperacillin-tazobactam (ZOSYN)  IV 3.375 g (08/05/17 1331)   PRN Meds:.Place/Maintain arterial line **AND** sodium chloride, acetaminophen, ALPRAZolam, levalbuterol, lidocaine, morphine injection, ondansetron (ZOFRAN) IV, sodium chloride flush, zolpidem Allergies  Allergen Reactions  . No Known Allergies    Review of Systems  Constitutional: Positive for activity change, appetite change and fatigue.  Respiratory: Positive for shortness of breath.   Neurological: Positive for weakness.    Physical Exam  Constitutional: He is oriented to person, place, and time. He appears well-developed.  HENT:  Head: Normocephalic and atraumatic.  Cardiovascular: An irregularly irregular rhythm present. Tachycardia present.   Pulmonary/Chest: Effort normal. No accessory muscle usage. No tachypnea.  Mild distress  Abdominal: Soft. Normal appearance.  Neurological: He is alert and oriented to person, place, and time.  Nursing note and vitals reviewed.   Vital Signs: BP 91/64   Pulse (!) 143   Temp 98.7 F (37.1 C) (Oral)   Resp (!) 32    Ht 5' 9"  (1.753 m)   Wt 115 kg (253 lb 8.5 oz)   SpO2 96%   BMI 37.44 kg/m  Pain Assessment: No/denies pain   Pain Score: 0-No pain   SpO2: SpO2: 96 % O2 Device:SpO2: 96 % O2 Flow Rate: .O2 Flow Rate (L/min): 4 L/min  IO: Intake/output summary:  Intake/Output Summary (Last 24 hours) at 08/05/17 1544 Last data filed at 08/05/17 1331  Gross per 24 hour  Intake           1532.1 ml  Output             1100 ml  Net            432.1 ml    LBM: Last BM Date: 08/05/17 Baseline Weight: Weight: 111.9 kg (246 lb 11.1 oz) Most recent weight: Weight: 115 kg (253 lb 8.5 oz)     Palliative Assessment/Data: 30%     Time Total: 80 min  Greater than 50%  of this time was spent counseling and coordinating care related to the above assessment and plan.  Signed by: Vinie Sill, NP Palliative Medicine Team Pager # [phone] (M-F 8a-5p) Team Phone # [phone] (Nights/Weekends)

## 2017-08-05 NOTE — Progress Notes (Signed)
Pt noted to still be in persistent Rapid Afib rate 125-137 bpm while on Amiodarone drip. Cards fellow called and Pt hemodynamics reviewed, order received for additional 150 mg Amiodarone bolus. Will continue to monitor and assess.

## 2017-08-06 DIAGNOSIS — Z515 Encounter for palliative care: Secondary | ICD-10-CM

## 2017-08-06 DIAGNOSIS — Z7189 Other specified counseling: Secondary | ICD-10-CM

## 2017-08-06 LAB — BASIC METABOLIC PANEL
Anion gap: 9 (ref 5–15)
Anion gap: 11 (ref 5–15)
BUN: 33 mg/dL — ABNORMAL HIGH (ref 6–20)
BUN: 34 mg/dL — ABNORMAL HIGH (ref 6–20)
CALCIUM: 8 mg/dL — AB (ref 8.9–10.3)
CHLORIDE: 114 mmol/L — AB (ref 101–111)
CO2: 17 mmol/L — AB (ref 22–32)
CO2: 15 mmol/L — ABNORMAL LOW (ref 22–32)
CREATININE: 2.02 mg/dL — AB (ref 0.61–1.24)
Calcium: 8.1 mg/dL — ABNORMAL LOW (ref 8.9–10.3)
Chloride: 112 mmol/L — ABNORMAL HIGH (ref 101–111)
Creatinine, Ser: 2.15 mg/dL — ABNORMAL HIGH (ref 0.61–1.24)
GFR calc Af Amer: 32 mL/min — ABNORMAL LOW (ref >60)
GFR calc non Af Amer: 29 mL/min — ABNORMAL LOW (ref >60)
GFR calc non Af Amer: 27 mL/min — ABNORMAL LOW (ref >60)
GFR, EST AFRICAN AMERICAN: 34 mL/min — AB (ref >60)
GLUCOSE: 114 mg/dL — AB (ref 65–99)
Glucose, Bld: 191 mg/dL — ABNORMAL HIGH (ref 65–99)
Potassium: 2.8 mmol/L — ABNORMAL LOW (ref 3.5–5.1)
Potassium: 2.8 mmol/L — ABNORMAL LOW (ref 3.5–5.1)
Sodium: 140 mmol/L (ref 135–145)
Sodium: 138 mmol/L (ref 135–145)

## 2017-08-06 LAB — POCT I-STAT 4, (NA,K, GLUC, HGB,HCT)
Glucose, Bld: 182 mg/dL — ABNORMAL HIGH (ref 65–99)
HCT: 28 % — ABNORMAL LOW (ref 39.0–52.0)
Hemoglobin: 9.5 g/dL — ABNORMAL LOW (ref 13.0–17.0)
Potassium: 2.9 mmol/L — ABNORMAL LOW (ref 3.5–5.1)
Sodium: 142 mmol/L (ref 135–145)

## 2017-08-06 LAB — CBC
HEMATOCRIT: 28.8 % — AB (ref 39.0–52.0)
HEMOGLOBIN: 9.8 g/dL — AB (ref 13.0–17.0)
MCH: 29.3 pg (ref 26.0–34.0)
MCHC: 34 g/dL (ref 30.0–36.0)
MCV: 86.2 fL (ref 78.0–100.0)
Platelets: 132 10*3/uL — ABNORMAL LOW (ref 150–400)
RBC: 3.34 MIL/uL — ABNORMAL LOW (ref 4.22–5.81)
RDW: 17.4 % — AB (ref 11.5–15.5)
WBC: 1.6 10*3/uL — ABNORMAL LOW (ref 4.0–10.5)

## 2017-08-06 LAB — MAGNESIUM: Magnesium: 1.7 mg/dL (ref 1.7–2.4)

## 2017-08-06 MED ORDER — SODIUM CHLORIDE 0.9 % IV BOLUS (SEPSIS)
500.0000 mL | Freq: Once | INTRAVENOUS | Status: AC
  Administered 2017-08-06: 500 mL via INTRAVENOUS

## 2017-08-06 MED ORDER — POTASSIUM CHLORIDE 10 MEQ/50ML IV SOLN
INTRAVENOUS | Status: AC
  Filled 2017-08-06: qty 50

## 2017-08-06 MED ORDER — AMIODARONE IV BOLUS ONLY 150 MG/100ML
150.0000 mg | Freq: Once | INTRAVENOUS | Status: AC
  Administered 2017-08-06: 150 mg via INTRAVENOUS

## 2017-08-06 MED ORDER — POTASSIUM CHLORIDE 10 MEQ/50ML IV SOLN
INTRAVENOUS | Status: AC
  Filled 2017-08-06: qty 150

## 2017-08-06 MED ORDER — MORPHINE SULFATE (PF) 4 MG/ML IV SOLN
2.0000 mg | INTRAVENOUS | Status: DC | PRN
  Administered 2017-08-07 (×2): 2 mg via INTRAVENOUS
  Filled 2017-08-06 (×3): qty 1

## 2017-08-06 MED ORDER — POTASSIUM CHLORIDE 10 MEQ/50ML IV SOLN
10.0000 meq | INTRAVENOUS | Status: AC
  Administered 2017-08-06 – 2017-08-07 (×4): 10 meq via INTRAVENOUS

## 2017-08-06 MED ORDER — ALPRAZOLAM 0.25 MG PO TABS
0.2500 mg | ORAL_TABLET | Freq: Three times a day (TID) | ORAL | Status: DC | PRN
  Administered 2017-08-06: 0.25 mg via ORAL
  Filled 2017-08-06 (×2): qty 1

## 2017-08-06 NOTE — Plan of Care (Signed)
   Unfortunate 82 yo M w/ a h/o of HTN, HLD, Hodgkin's lympohma, stage 4 nonsmall cell lung cancer w/ brainy mets, CHF and new onset Afib w/ RVR and NSTEMI at time of admission. Pt initially admitted to the cardiology service for possible cath do to NSTEMI and decompensated CHF, however through multiple conversations w/ the pt and family it has been decided that pt is not a cath candidate. Pt required for pressors and BIPAP during admission but is now stable off both. Oncology and palliative have been consulted to discuss [place] and Hospice. Notes pending. [facility] will assume care of pt in am after [place] have been established.   Linna Darner, MD [facility] 08/06/2017, 9:31 AM

## 2017-08-06 NOTE — Progress Notes (Signed)
Dakota Shannon-emergency contact and daughter-notified of pt's condition.

## 2017-08-06 NOTE — Progress Notes (Signed)
Progress Note  Patient Name: Dakota Shannon Date of Encounter: 08/06/2017  Primary Cardiologist: Domenic Polite  Subjective   Lethargic resting comfortably   Inpatient Medications    Scheduled Meds: . aspirin  81 mg Oral Daily  . atorvastatin  80 mg Oral q1800  . Chlorhexidine Gluconate Cloth  6 each Topical Daily  . feeding supplement (ENSURE ENLIVE)  237 mL Oral BID BM  . folic acid  0.5 mg Oral Daily  . mouth rinse  15 mL Mouth Rinse BID  . sodium chloride flush  10-40 mL Intracatheter Q12H   Continuous Infusions: . sodium chloride    . amiodarone 60 mg/hr (08/06/17 0800)  . phenylephrine (NEO-SYNEPHRINE) Adult infusion 25.067 mcg/min (08/06/17 0800)  . piperacillin-tazobactam (ZOSYN)  IV 3.375 g (08/06/17 0529)   PRN Meds: Place/Maintain arterial line **AND** sodium chloride, acetaminophen, ALPRAZolam, levalbuterol, lidocaine, morphine injection, ondansetron (ZOFRAN) IV, sodium chloride flush, zolpidem   Vital Signs    Vitals:   08/06/17 0630 08/06/17 0700 08/06/17 0800 08/06/17 0832  BP: (!) 83/59 (!) 87/57 (!) 84/63   Pulse: (!) 110 (!) 121 (!) 123   Resp: (!) 21 (!) 28 (!) 27   Temp:    98.2 F (36.8 C)  TempSrc:    Oral  SpO2: 100% 100% 92%   Weight:      Height:        Intake/Output Summary (Last 24 hours) at 08/06/17 0853 Last data filed at 08/06/17 0800  Gross per 24 hour  Intake          1992.45 ml  Output             1001 ml  Net           991.45 ml   Filed Weights   08/01/2017 1632 08/05/17 0600  Weight: 246 lb 11.1 oz (111.9 kg) 253 lb 8.5 oz (115 kg)    Telemetry    NSR  - Personally Reviewed  ECG    afib rate 133 diffuse ST depressin - Personally Reviewed  Physical Exam  Chronically ill white male  GEN: No acute distress.   Neck: No JVD Cardiac: RRR, no murmurs, rubs, or gallops.  Respiratory:  basilar crackles . GI: Soft, nontender, non-distended  MS: No edema; No deformity. Neuro:  Nonfocal  Psych: Normal affect   Labs      Chemistry  Recent Labs Lab 07/23/2017 1845 08/03/17 0434 08/05/17 0348 08/06/17 0358  NA 141 141 140 140  K 4.0 3.8 3.1* 2.8*  CL 120* 120* 116* 114*  CO2 13* 17* 16* 17*  GLUCOSE 123* 113* 117* 114*  BUN 29* 29* 31* 33*  CREATININE 1.29* 1.37* 2.22* 2.02*  CALCIUM 8.5* 8.4* 8.3* 8.0*  PROT 5.5*  --   --   --   ALBUMIN 3.1*  --   --   --   AST 68*  --   --   --   ALT 47  --   --   --   ALKPHOS 57  --   --   --   BILITOT 0.8  --   --   --   GFRNONAA 51* 47* 26* 29*  GFRAA 59* 55* 30* 34*  ANIONGAP 8 4* 8 9     Hematology  Recent Labs Lab 08/04/17 0432 08/05/17 0348 08/06/17 0358  WBC 1.4* 2.4* 1.6*  RBC 3.26* 3.72* 3.34*  HGB 9.8* 10.9* 9.8*  HCT 28.1* 31.9* 28.8*  MCV 86.2 85.8 86.2  MCH  30.1 29.3 29.3  MCHC 34.9 34.2 34.0  RDW 16.4* 17.2* 17.4*  PLT 113* 164 132*    Cardiac Enzymes  Recent Labs Lab 07/27/2017 1845 08/03/17 0115 08/03/17 0434  TROPONINI 6.48* 15.68* 18.30*   No results for input(s): TROPIPOC in the last 168 hours.   BNPNo results for input(s): BNP, PROBNP in the last 168 hours.   DDimer No results for input(s): DDIMER in the last 168 hours.   Radiology    Dg Chest Port 1 View  Result Date: 08/05/2017 CLINICAL DATA:  CHF EXAM: PORTABLE CHEST 1 VIEW COMPARISON:  08/04/2017 and prior studies FINDINGS: Cardiomegaly and right Port-A-Cath with tip overlying the upper SVC again noted. Near complete resolution of bilateral interstitial and airspace opacities/edema noted. There is no evidence of airspace disease, pleural effusion or pneumothorax. IMPRESSION: Near complete resolution of bilateral interstitial and airspace opacities/edema. Electronically Signed   By: Margarette Canada M.D.   On: 08/05/2017 07:46   Dg Chest Port 1 View  Result Date: 08/04/2017 CLINICAL DATA:  Shortness of breath. History of cancer and hypertension. EXAM: PORTABLE CHEST 1 VIEW COMPARISON:  08/03/2017 FINDINGS: Right-sided PowerPort tip overlying the superior vena cava.  The heart is mildly enlarged. There are increasing bilateral airspace filling opacities primarily at the bases. Opacities are asymmetric, right greater than left, compatible with asymmetric pulmonary edema or infectious infiltrates. IMPRESSION: Increasing bilateral infiltrates right greater than left. Electronically Signed   By: Nolon Nations M.D.   On: 08/04/2017 12:41    Cardiac Studies   Echo:  EF 35-40% personally reviewed  Patient Profile     82 y.o. malewith a history of HTN, HLD, hodgkin's lympoma, stage IV RUL nonsmall cell lung cancer with 4mm L frontal metastasis, aortic root dilation (4cm on last echo), h/o asymptomatic Wolff-Parkinson-White syndrome and history of atrial fibrillation admitted with recurrent AF and NSTEMI   Assessment & Plan    SEMI:  Dr Meda Coffee and Bemsimohn have discussed with oncology and family no plans for cath given stage 4 lung cancer and brain mets. He is stable off bipap with sats 100% DNR with palliative care consult pending Arterial line out off pressors  PAF continue amiodarone converted in NSR no anticoagulation due to metastatic lung cancer  Will ask [facility] to assume care as no aggressive cardiac care is planned at this time   For questions or updates, please contact [facility] Please consult [email or web address] for contact info under Cardiology/STEMI.      Signed, Jenkins Rouge, MD  08/06/2017, 8:53 AM

## 2017-08-06 NOTE — Plan of Care (Signed)
Problem: Cardiac: Goal: Ability to achieve and maintain adequate cardiopulmonary perfusion will improve Outcome: Not Progressing Pt persistently in Afib with RVR rates of 110-120's, on Amiodarone drip at 60 mg plus multiple 150 mg bolus'. BP being controlled with MAP > 65 on low dose Neosyn.

## 2017-08-06 NOTE — Progress Notes (Addendum)
2258  Patient had been restless and dyspneic for a few hours but this seemed to be baseline. RN had given ambien and xanax, and had even used essential oils to help patient calm down and rest.  RN went into patient's room right as patient went into vfib/vtach.  Patient was responsive and reaching up to the sky as if he was hallucinating.  Patient spontaneously came out of vfib/vtach after about 30 seconds and he went into sinus tach with frequent runs of PVCs and non-sustained vtach.  Patient remained conscious during entire event.  Due to patient's partial code status, no compressions, shocks, or medication was administered.  Patient was placed on Bipap as are his wishes with improvement in respiratory status as his O2 sats began to drop.  He had been on the nasal cannula off and on prior to the incident with O2 sats in 90s.  Cardiology MD notified of patient's condition.  Amio bolus 150 mg ordered and fluid bolus 500 mL ordered.  Both were administered.  istat K+ revealed a K+ of 2.8.  MD called again and orders given for 4 runs of K+ via central line.  Green top sent down to run [employer] as well -- this had not yet resulted at time of MD notification second time.  0000  100 mcg neo, amio 60 mg, fluid bolus still infusing and K+ infusing. Patient in NSR and ectopy seemingly resolved. HR 90, O2 sat 100, BP 101/74 (MAP 84)  RR 29.  Family arrived at bedside about 0015 and was notified of the changes in patient status and the interventions done. RN will continue to monitor patient closely.

## 2017-08-06 NOTE — Progress Notes (Signed)
Daily Progress Note   Patient Name: Dakota Shannon       Date: 08/06/2017 DOB: [birth year]  Age: 82 y.o. MRN#: [id number] Attending Physician: Dorothy Spark, MD Primary Care Physician: [name], MD Admit Date: 07/22/2017  Reason for Consultation/Follow-up: Establishing goals of care  Subjective: Mr. Monnier is lying in bed. Complains of pain in upper back and shoulders.   Length of Stay: 4  Current Medications: Scheduled Meds:  . aspirin  81 mg Oral Daily  . atorvastatin  80 mg Oral q1800  . Chlorhexidine Gluconate Cloth  6 each Topical Daily  . feeding supplement (ENSURE ENLIVE)  237 mL Oral BID BM  . folic acid  0.5 mg Oral Daily  . mouth rinse  15 mL Mouth Rinse BID  . sodium chloride flush  10-40 mL Intracatheter Q12H    Continuous Infusions: . sodium chloride    . amiodarone 60 mg/hr (08/06/17 0800)  . phenylephrine (NEO-SYNEPHRINE) Adult infusion 25.067 mcg/min (08/06/17 0800)  . piperacillin-tazobactam (ZOSYN)  IV Stopped (08/06/17 0930)    PRN Meds: Place/Maintain arterial line **AND** sodium chloride, acetaminophen, ALPRAZolam, levalbuterol, lidocaine, morphine injection, ondansetron (ZOFRAN) IV, sodium chloride flush, zolpidem  Physical Exam     Constitutional: He is oriented to person, place, and time. He appears well-developed.  HENT:  Head: Normocephalic and atraumatic.  Cardiovascular: An irregularly irregular rhythm present. Tachycardia present.   Pulmonary/Chest: Effort normal. No accessory muscle usage. No tachypnea.  Mild distress  Abdominal: Soft. Normal appearance.  Neurological: He is alert and oriented to person, place, and time.  Nursing note and vitals reviewed.  Vital Signs: BP (!) 84/63 (BP Location: Left Arm)   Pulse (!) 123   Temp  98.2 F (36.8 C) (Oral)   Resp (!) 27   Ht 5\' 9"  (1.753 m)   Wt 115 kg (253 lb 8.5 oz)   SpO2 92%   BMI 37.44 kg/m  SpO2: SpO2: 92 % O2 Device: O2 Device: Nasal Cannula O2 Flow Rate: O2 Flow Rate (L/min): 4 L/min  Intake/output summary:  Intake/Output Summary (Last 24 hours) at 08/06/17 1043 Last data filed at 08/06/17 0800  Gross per 24 hour  Intake          1671.29 ml  Output  1001 ml  Net           670.29 ml   LBM: Last BM Date: 08/05/17 Baseline Weight: Weight: 111.9 kg (246 lb 11.1 oz) Most recent weight: Weight: 115 kg (253 lb 8.5 oz)       Palliative Assessment/Data: 30%     Patient Active Problem List   Diagnosis Date Noted  . Goals of care, counseling/discussion   . Palliative care encounter   . Non-ST elevation (NSTEMI) myocardial infarction ([place])   . Acute diastolic CHF (congestive heart failure), NYHA class 3 ([place])   . Renal failure syndrome   . Atrial fibrillation with RVR ([place]) 07/29/2017  . Lung cancer metastatic to brain ([place]) 06/12/2017  . Gastritis due to nonsteroidal anti-inflammatory drug   . Stricture and stenosis of esophagus   . Dysphagia 06/06/2017  . Mediastinal adenopathy 05/04/2017  . COPD, moderate ([place]) 05/04/2017  . Multiple thyroid nodules 09/14/2016  . Lung mass 01/26/2015  . Lung cancer, right upper lobe 12/11/2014  . Cervical lymphadenopathy 12/11/2014  . Renal mass, right 12/11/2014  . Essential hypertension, benign 06/16/2010  . PURE HYPERCHOLESTEROLEMIA 04/18/2010  . 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 MURMUR 04/18/2010  . NONSPECIFIC ABNORMAL UNSPEC CV FUNCTION STUDY 04/18/2010    Palliative Care Assessment & Plan   HPI: 82 y.o. 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  Hospitalization complicated by inability to receive anticoagulation d/t recently radiated brain mets, continued afib, CHF exacerbation requiring BiPAP.   Assessment: I spent much time discussing with Mr. Wailes the complication between his MI, heart rate, and cancer. Discussed that there is not much more we can do to improve his condition and that we are not already doing. Explained that we are unfortunately out of options and we are not making any progress. We were able to better discuss today his poor prognosis and his wishes.   He tells me that he is happy to stay here and continue doing what we are doing if there is any chance he will improve and get some more time. I explained that I do not believe that he will get stronger and improve. We discussed an alternative plan to focus on his real goal to be closer to his friends and family if his time is limited. We discussed the use of hospice. He wants to be sure that we have attempted all efforts. We agree to discuss further with his daughter, Anderson Malta, and then to reassess tomorrow his readiness for transition to hospice.   Late entry: Shared conversation about with Anderson Malta and his granddaughter. All questions/concerns addressed. Anderson Malta is supportive of transition to comfort if Mr. Bogdanski agrees.   Recommendations/Plan:  Dyspnea: Improved today. Continue diuresis treatment per cardiology.   Throat irritation/swallowing difficulty: Encouraged better diet choices with soft bland foods. SLP has seen. Regular diet placed for comfort.    Anxiety: Xanax 0.25 mg TID prn.   Pain/dyspnea: Morphine 2 mg every 2 hours prn.   Goals of Care and Additional Recommendations:  Limitations on Scope of Treatment: No Artificial Feeding, No Chemotherapy and No Radiation  Code Status:  DNR ok with BiPAP and vasopressors  Prognosis:   < 2 weeks and possibly days without current treatments and focus on comfort.   Discharge Planning:  To Be Determined.  Possibly to hospice facility  closer to home.   Thank you for allowing the Palliative Medicine Team to assist in the care of this patient.   Total Time 80 min Prolonged Time Billed  no       Greater than 50%  of this time was spent counseling and coordinating care related to the above assessment and plan.  Vinie Sill, NP Palliative Medicine Team Pager # [phone] (M-F 8a-5p) Team Phone # [phone] (Nights/Weekends)

## 2017-08-06 NOTE — Progress Notes (Signed)
  Speech Language Pathology Treatment: Dysphagia  Patient Details Name: Dakota Shannon MRN: [id number] DOB: Apr 07, 1935 Today's Date: 08/06/2017 Time: 4098-1191 SLP Time Calculation (min) (ACUTE ONLY): 21 min  Assessment / Plan / Recommendation Clinical Impression  Goal of session was to determine appropriateness/safety to initiate diet re: respiratory status; Palliative care started pt on full liquids earlier today. No dyspnea upon SLP arrival. Lunch tray arrived and pt consumed cream of tomato soup, tea (cup and straw) without apparent difficulty. He expectorated frothy secretions with cup half filled with mucous at bedside suggestive of possible esophageal component (pt with complaints of globus sensation with thicker foods). Will continue to work with pt and determine diet that is safe and or for comfort/quality given his prognosis per notes in chart (?order regular and allow pt to order what he feels he can transit through esophagus easiest?).    HPI HPI: 82 y.o.malewith a history of HTN, HLD, hodgkin's lymphoma, stage IV RUL nonsmall cell lung cancer with 68mm L frontal metastasis (undergoing chemo and radiation tx), aortic root dilation (4cm on last echo), h/o asymptomatic Wolff-Parkinson-White syndrome and history of atrial fibrillation, admitted with afib with RVR, dehydration, elevated troponin, acute hypoxic resp failure with fluctuating saturations. Per MD notes, CXR has looked like HF but may also have RLL infiltrate in setting of neutropenia.  Has decided against intubation.  Overall prognosis of survival next 12 months < 50%  per MD discussion with oncology.  Critically ill.  Per notes from [facility], pt with long-standing solid food dysphagia; barium swallow completed with recs for endoscopy; underwent dilatation in Aug prior to beginning chemo/rad per pt's dtr.      SLP Plan  Continue with current plan of care       Recommendations  Diet recommendations: Thin liquid (full  liquids) Liquids provided via: Cup;Straw Medication Administration: Whole meds with puree Supervision: Patient able to self feed;Intermittent supervision to cue for compensatory strategies Compensations: Slow rate;Small sips/bites Postural Changes and/or Swallow Maneuvers: Upright 30-60 min after meal;Seated upright 90 degrees                Oral Care Recommendations: Oral care BID Follow up Recommendations:  (TBD) SLP Visit Diagnosis: Dysphagia, unspecified (R13.10) Plan: Continue with current plan of care       GO                Houston Siren 08/06/2017, 1:42 PM   Orbie Pyo Nagee Goates M.[employer] [phone]

## 2017-08-06 DEATH — deceased

## 2017-08-07 DIAGNOSIS — E669 Obesity, unspecified: Secondary | ICD-10-CM | POA: Diagnosis present

## 2017-08-07 DIAGNOSIS — R579 Shock, unspecified: Secondary | ICD-10-CM

## 2017-08-07 LAB — CBC
HCT: 33.8 % — ABNORMAL LOW (ref 39.0–52.0)
HEMOGLOBIN: 11.3 g/dL — AB (ref 13.0–17.0)
MCH: 29 pg (ref 26.0–34.0)
MCHC: 33.4 g/dL (ref 30.0–36.0)
MCV: 86.9 fL (ref 78.0–100.0)
PLATELETS: 205 10*3/uL (ref 150–400)
RBC: 3.89 MIL/uL — ABNORMAL LOW (ref 4.22–5.81)
RDW: 17.9 % — AB (ref 11.5–15.5)
WBC: 3.3 10*3/uL — ABNORMAL LOW (ref 4.0–10.5)

## 2017-08-07 LAB — BASIC METABOLIC PANEL
Anion gap: 11 (ref 5–15)
BUN: 35 mg/dL — AB (ref 6–20)
CALCIUM: 8.2 mg/dL — AB (ref 8.9–10.3)
CHLORIDE: 114 mmol/L — AB (ref 101–111)
CO2: 15 mmol/L — ABNORMAL LOW (ref 22–32)
CREATININE: 2.37 mg/dL — AB (ref 0.61–1.24)
GFR, EST AFRICAN AMERICAN: 28 mL/min — AB (ref >60)
GFR, EST NON AFRICAN AMERICAN: 24 mL/min — AB (ref >60)
Glucose, Bld: 142 mg/dL — ABNORMAL HIGH (ref 65–99)
Potassium: 3.7 mmol/L (ref 3.5–5.1)
SODIUM: 140 mmol/L (ref 135–145)

## 2017-08-07 MED ORDER — MORPHINE BOLUS VIA INFUSION
1.0000 mg | INTRAVENOUS | Status: DC | PRN
  Filled 2017-08-07: qty 2

## 2017-08-07 MED ORDER — MORPHINE SULFATE 25 MG/ML IV SOLN
2.0000 mg/h | INTRAVENOUS | Status: DC
  Administered 2017-08-07: 1 mg/h via INTRAVENOUS
  Filled 2017-08-07: qty 10

## 2017-08-07 MED ORDER — MORPHINE BOLUS VIA INFUSION
2.0000 mg | INTRAVENOUS | Status: DC | PRN
  Administered 2017-08-07: 1 mg via INTRAVENOUS
  Administered 2017-08-08: 4 mg via INTRAVENOUS
  Administered 2017-08-08: 1 mg via INTRAVENOUS
  Administered 2017-08-08 (×2): 2 mg via INTRAVENOUS
  Filled 2017-08-07: qty 4

## 2017-08-07 MED ORDER — GLYCOPYRROLATE 0.2 MG/ML IJ SOLN: 0.2000 mg | INTRAMUSCULAR | Status: DC | PRN

## 2017-08-07 MED ORDER — LORAZEPAM 2 MG/ML IJ SOLN: 1.0000 mg | INTRAMUSCULAR | Status: DC | PRN

## 2017-08-07 MED ORDER — MORPHINE BOLUS VIA INFUSION: 2.0000 mg | INTRAVENOUS | Status: DC | PRN

## 2017-08-07 NOTE — Progress Notes (Signed)
Daily Progress Note   Patient Name: Dakota Shannon       Date: 08/07/2017 DOB: 24-Jul-1935  Age: 82 y.o. MRN#: [id number] Attending Physician: Dorothy Spark, MD Primary Care Physician: [name], MD Admit Date: 07/12/2017  Reason for Consultation/Follow-up: Establishing goals of care  Subjective: Mr. Nick is lying in bed. Complains of pain in upper back and shoulders.   Length of Stay: 5  Current Medications: Scheduled Meds:  . Chlorhexidine Gluconate Cloth  6 each Topical Daily  . mouth rinse  15 mL Mouth Rinse BID  . sodium chloride flush  10-40 mL Intracatheter Q12H    Continuous Infusions: . sodium chloride    . amiodarone 60 mg/hr (08/07/17 1208)  . morphine    . phenylephrine (NEO-SYNEPHRINE) Adult infusion 230 mcg/min (08/07/17 1209)    PRN Meds: Place/Maintain arterial line **AND** sodium chloride, acetaminophen, glycopyrrolate, levalbuterol, lidocaine, LORazepam, morphine injection, morphine, ondansetron (ZOFRAN) IV, sodium chloride flush  Physical Exam     Constitutional: He is oriented to person, place, and time. He appears well-developed.  HENT:  Head: Normocephalic and atraumatic.  Cardiovascular: An irregularly irregular rhythm present. Tachycardia present.   Pulmonary/Chest: Effort normal. No accessory muscle usage. No tachypnea.  Mod distress - severe without BiPAP Abdominal: Soft. Normal appearance.  Neurological: He is alert and oriented to person, place, and time.  Nursing note and vitals reviewed.  Vital Signs: BP (!) 91/44   Pulse 83   Temp 97.8 F (36.6 C) (Axillary)   Resp (!) 29   Ht 5\' 9"  (1.753 m)   Wt 115 kg (253 lb 8.5 oz)   SpO2 100%   BMI 37.44 kg/m  SpO2: SpO2: 100 % O2 Device: O2 Device: Bi-PAP O2 Flow Rate: O2 Flow Rate  (L/min): 2 L/min  Intake/output summary:   Intake/Output Summary (Last 24 hours) at 08/07/17 1216 Last data filed at 08/07/17 1100  Gross per 24 hour  Intake          2757.64 ml  Output              530 ml  Net          2227.64 ml   LBM: Last BM Date: 08/07/17 Baseline Weight: Weight: 111.9 kg (246 lb 11.1 oz) Most recent weight: Weight: 115 kg (253 lb 8.5  oz)       Palliative Assessment/Data: 30%     Patient Active Problem List   Diagnosis Date Noted  . Obesity (BMI 30-39.9) 08/07/2017  . Goals of care, counseling/discussion   . Palliative care encounter   . Non-ST elevation (NSTEMI) myocardial infarction ([place])   . Acute diastolic CHF (congestive heart failure), NYHA class 3 ([place])   . Renal failure syndrome   . Atrial fibrillation with RVR ([place]) 07/26/2017  . Lung cancer metastatic to brain ([place]) 06/12/2017  . Gastritis due to nonsteroidal anti-inflammatory drug   . Stricture and stenosis of esophagus   . Dysphagia 06/06/2017  . Mediastinal adenopathy 05/04/2017  . COPD, moderate ([place]) 05/04/2017  . Multiple thyroid nodules 09/14/2016  . Lung mass 01/26/2015  . Lung cancer, right upper lobe 12/11/2014  . Cervical lymphadenopathy 12/11/2014  . Renal mass, right 12/11/2014  . Essential hypertension, benign 06/16/2010  . PURE HYPERCHOLESTEROLEMIA 04/18/2010  . 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 MURMUR 04/18/2010  . NONSPECIFIC ABNORMAL UNSPEC CV FUNCTION STUDY 04/18/2010    Palliative Care Assessment & Plan   HPI: 82 y.o. male with PMH of stage IV RUL NSCLC with brain mets, hodgkin's lymphoma, HTN, HLD, aortic root dilation (4cm last echo), Wolff-Parkinson-White syndrome, atrial fibrillation admitted to [facility] from [facility] 07/31/2017 with afib RVR, NSTEMI. Hospitalization complicated by inability to receive anticoagulation d/t recently radiated brain mets, continued afib, CHF exacerbation requiring BiPAP.   Assessment: Mr.  Age has declined overnight and now unable to come off BiPAP, vasopressor needs have significantly increased. His daughter Anderson Malta and granddaughter are at bedside. We discussed that he is at EOL and they both very much wish for him to comfortable and do not wish to see him prolonged in his current state. We discussed addition of morphine infusion to ensure comfort and respiratory relief.   Mr. Stencil awoke as we were talking and tells [place] that he is ready to go to heaven. He does have some family members he wishes to see if possible and Anderson Malta is calling them to come to bedside. Explained our focus for comfort.   Will add morphine infusion to ensure comfort. No escalation of care. Allow time for family to come visit if possible before deescalation of BiPAP and vasopressors. Will follow up for readiness this afternoon. May pass even with all this support.   Recommendations/Plan:  Pain/Dyspnea: Morphine 1 mg/hr with bolus 1-2 mg every 30 min prn.   Anxiety: Ativan IV 1 mg every 4 hours prn.   Robinul prn.   Goals of Care and Additional Recommendations:  Limitations on Scope of Treatment: No Artificial Feeding, No Chemotherapy and No Radiation  Code Status:  DNR  Prognosis:   Likely hours.   Discharge Planning:  Expect hospital death.   Thank you for allowing the Palliative Medicine Team to assist in the care of this patient.   Total Time 35 min Prolonged Time Billed  no       Greater than 50%  of this time was spent counseling and coordinating care related to the above assessment and plan.  Vinie Sill, NP Palliative Medicine Team Pager # [phone] (M-F 8a-5p) Team Phone # [phone] (Nights/Weekends)

## 2017-08-07 NOTE — Progress Notes (Signed)
Visited with patient and family.  Provided prayer for the continued journey/medical and life decisions the patient has decided to take.  Chaplain provided compassionate listening and encouragement for the family.  Please call Spiritual Care office if additional support is needed at [phone].  Thank you to everyone who has provided care to this patient and family.    08/07/17 1256  Clinical Encounter Type  Visited With Patient and family together  Visit Type Initial;Psychological support;Spiritual support;Social support  Spiritual Encounters  Spiritual Needs Prayer

## 2017-08-07 NOTE — Progress Notes (Signed)
08/07/2017 0930 Dr. Johnsie Cancel with cardiology paged and made aware of events from last evening. No new orders at this time. Internal medicine MD, Dr. Rockne Menghini at bedside to take over patient care. Updates provided to MD including pt. With increasing SOB, RR, BP support from Neo-Synephrine and decreased BP, and no UOP this am. Verbal order received ok to administer prn morphine at this time to improve pt. Comfort despite low BP. Orders enacted. Attempted to remove BIPAP for patient comfort and pt. Requested to re-apply mask within 5 minutes due to increased SOB. BIPAP replaced for patient comfort. Dr. Rockne Menghini contacted pt. Family and updates given over phone. Palliative care to see patient today. Will continue to closely monitor patient.  Dakota Shannon, Arville Lime

## 2017-08-07 NOTE — Progress Notes (Signed)
SLP Cancellation Note  Patient Details Name: JAIRE PINKHAM MRN: [id number] DOB: 01/03/1935   Cancelled treatment:        Noted events of last night. Now on Bipap. Will continue efforts for dysphagia re: safest versus comfort/quality of life diet as appropriate.   Houston Siren 08/07/2017, 9:03 AM   Orbie Pyo Colvin Caroli.[employer] [phone]

## 2017-08-07 NOTE — Progress Notes (Signed)
08/07/2017 11:31 AM Pt. States relief from prn morphine dose however, increased need for Neo-Synephrine. Dr. Rockne Menghini paged and made aware. No new orders at this time. Palliative Care also paged and made aware. Palliative care to see patient shortly. Family at bedside. Updated on plan of care. Will continue to monitor.  [place]ia Baria, Arville Lime

## 2017-08-07 NOTE — Progress Notes (Signed)
Progress Note    Dakota Shannon  GLO:[id number] DOB: 24-Dec-1934  DOA: 07/16/2017 PCP: [name], MD    Brief Narrative:   Chief complaint: Follow-up rapid A. fib and non-STEMI.  Medical records reviewed and are as summarized below:  Dakota Shannon is an 82 y.o. male with a PMH of hypertension, hyperlipidemia, Hodgkin's lymphoma, stage IV non-small cell lung cancer with brain metastases, CHF, and new onset A. fib with RVR/non-STEMI who was admitted to cardiology service for possible cath, however after a long discussion with family, he was deemed to not be a cardiac catheterization candidate. He subsequently was evaluated by the palliative care team who felt his prognosis was less than 2 weeks and possibly days without current treatments. He continues to want some interventions, but is now a partial code.  Assessment/Plan:   Principal Problem:   Atrial fibrillation with RVR (HCC)/Non-STEMI/hyperlipidemia/NSVT Patient not a candidate for cardiac catheterization. Atrial fibrillation resolved on amiodarone, maintaining normal sinus rhythm. Not a candidate for anticoagulation due to his metastatic lung cancer per cardiology. Continue aspirin and statin.  Active Problems:   Acute respiratory failure Has been on bipap overnight after going into NSVT. Has been cyanotic per nursing.    Hypotension He is on Neosynephrine which has had to be titrated up overnight.He is exhibiting signs of shock with peripheral vasoconstriction, cold extremities, and cyanotic fingertips/toes. His overall prognosis is poor. Palliative care met with him and his family and has now opted for full comfort measures. He has been placed on a morphine drip. Expect an in-hospital death.    Febrile neutropenia WBC recovering. Remains on empiric Zosyn.    Stage IV lung cancer with brain metastasis Diagnosed March 2016. Has received chemotherapy at [facility] from Dr. Ella Jubilee, most recently with  carboplatin and Taxol, and stereotactic brain radiation therapy from Dr. Tammi Klippel. Considered terminal.    Acute diastolic CHF (congestive heart failure), NYHA class 3 (HCC) Weight up today and I/O balance positive.    Renal failure syndrome Creatinine trending up.    Goals of care, counseling/discussion/Palliative care encounter Goals of care established. DO NOT RESUSCITATE with BiPAP/pressors okay. Palliative following for possible residential hospice placement.    COPD Continue Xopenex as needed.    Obesity Body mass index is 37.44 kg/m.   Family Communication/Anticipated D/C date and plan/Code Status   DVT prophylaxis: Lovenox ordered. Code Status: DO NOT RESUSCITATE  Family Communication: Daughter updated by telephone. Disposition Plan: In-hospital death likely in the next 24 hours.   Medical Consultants:    Cardiology  Palliative Care   Anti-Infectives:    Zosyn 08/04/17---> 08/07/17  Subjective:   RN reports that he has been very anxious and needed Xanax and Ambien to be able to sleep. Had ventricular tachycardia last night. Patient says he got "scared" last night, and was confused. Is very SOB today.  Objective:    Vitals:   08/07/17 0600 08/07/17 0615 08/07/17 0630 08/07/17 0700  BP: (!) 85/52 (!) 65/38 (!) 71/50 (!) 93/45  Pulse: 99  65 95  Resp: (!) 29 (!) 29 (!) 32 (!) 37  Temp:      TempSrc:      SpO2: 100%  (!) 88% 100%  Weight:      Height:        Intake/Output Summary (Last 24 hours) at 08/07/17 0733 Last data filed at 08/07/17 0645  Gross per 24 hour  Intake  2539.24 ml  Output             1030 ml  Net          1509.24 ml   Filed Weights   07/16/2017 1632 08/05/17 0600  Weight: 111.9 kg (246 lb 11.1 oz) 115 kg (253 lb 8.5 oz)    Exam: General: Moderate distress due to increased WOB, Bipap on. Cardiovascular: Heart sounds show a regular rate, and rhythm. No gallops or rubs. No murmurs. No JVD. Lungs: Diminished bilaterally  with poor air movement. No rales, rhonchi or wheezes. Cyanotic. Increased WOB. Abdomen: Soft, nontender, nondistended with normal active bowel sounds. No masses. No hepatosplenomegaly. Neurological: Alert and oriented presently. Moves all extremities 4 with generalized weakness. Cranial nerves II through XII grossly intact. Skin: Warm and dry. No rashes or lesions. Extremities: Peripheral cyanosis present. No edema. Weak pulses. Psychiatric: Mood and affect are anxious. Insight and judgment are fair.   Data Reviewed:   I have personally reviewed following labs and imaging studies:  Labs: Labs show the following:   Basic Metabolic Panel:  Recent Labs Lab 08/03/17 0434 08/05/17 0348 08/06/17 0358 08/06/17 2322 08/06/17 2323 08/07/17 0518  NA 141 140 140 138 142 140  K 3.8 3.1* 2.8* 2.8* 2.9* 3.7  CL 120* 116* 114* 112*  --  114*  CO2 17* 16* 17* 15*  --  15*  GLUCOSE 113* 117* 114* 191* 182* 142*  BUN 29* 31* 33* 34*  --  35*  CREATININE 1.37* 2.22* 2.02* 2.15*  --  2.37*  CALCIUM 8.4* 8.3* 8.0* 8.1*  --  8.2*  MG  --   --   --  1.7  --   --    GFR Estimated Creatinine Clearance: 31.1 mL/min (A) (by C-G formula based on SCr of 2.37 mg/dL (H)). Liver Function Tests:  Recent Labs Lab 08/01/2017 1845  AST 68*  ALT 47  ALKPHOS 57  BILITOT 0.8  PROT 5.5*  ALBUMIN 3.1*   CBC:  Recent Labs Lab 07/14/2017 1845 08/03/17 0434 08/04/17 0432 08/05/17 0348 08/06/17 0358 08/06/17 2323 08/07/17 0518  WBC 1.8* 2.2* 1.4* 2.4* 1.6*  --  3.3*  NEUTROABS 1.3*  --   --   --   --   --   --   HGB 10.2* 10.1* 9.8* 10.9* 9.8* 9.5* 11.3*  HCT 30.2* 30.0* 28.1* 31.9* 28.8* 28.0* 33.8*  MCV 86.0 86.0 86.2 85.8 86.2  --  86.9  PLT 106* 122* 113* 164 132*  --  205   Cardiac Enzymes:  Recent Labs Lab 07/11/2017 1845 08/03/17 0115 08/03/17 0434  TROPONINI 6.48* 15.68* 18.30*   Sepsis Labs:  Recent Labs Lab 08/04/17 0432 08/04/17 1252 08/04/17 1712 08/05/17 0348  08/06/17 0358 08/07/17 0518  WBC 1.4*  --   --  2.4* 1.6* 3.3*  LATICACIDVEN  --  1.6 0.9  --   --   --     Microbiology Recent Results (from the past 240 hour(s))  MRSA PCR Screening     Status: None   Collection Time: 07/15/2017  4:34 PM  Result Value Ref Range Status   MRSA by PCR NEGATIVE NEGATIVE Final    Comment:        The GeneXpert MRSA Assay (FDA approved for NASAL specimens only), is one component of a comprehensive MRSA colonization surveillance program. It is not intended to diagnose MRSA infection nor to guide or monitor treatment for MRSA infections.   Culture, blood (routine x 2)  Status: None (Preliminary result)   Collection Time: 08/04/17  2:18 PM  Result Value Ref Range Status   Specimen Description BLOOD RIGHT RADIAL A LINE  Final   Special Requests   Final    BOTTLES DRAWN AEROBIC AND ANAEROBIC Blood Culture adequate volume   Culture NO GROWTH 2 DAYS  Final   Report Status PENDING  Incomplete  Culture, blood (routine x 2)     Status: None (Preliminary result)   Collection Time: 08/04/17  2:20 PM  Result Value Ref Range Status   Specimen Description BLOOD RIGHT CHEST PORT  Final   Special Requests   Final    BOTTLES DRAWN AEROBIC AND ANAEROBIC Blood Culture results may not be optimal due to an excessive volume of blood received in culture bottles   Culture NO GROWTH 2 DAYS  Final   Report Status PENDING  Incomplete    Procedures and diagnostic studies:  No results found.  Medications:   . aspirin  81 mg Oral Daily  . atorvastatin  80 mg Oral q1800  . Chlorhexidine Gluconate Cloth  6 each Topical Daily  . feeding supplement (ENSURE ENLIVE)  237 mL Oral BID BM  . folic acid  0.5 mg Oral Daily  . mouth rinse  15 mL Mouth Rinse BID  . sodium chloride flush  10-40 mL Intracatheter Q12H   Continuous Infusions: . sodium chloride    . amiodarone 60 mg/hr (08/07/17 0002)  . phenylephrine (NEO-SYNEPHRINE) Adult infusion 200 mcg/min (08/07/17 0701)   . piperacillin-tazobactam (ZOSYN)  IV 3.375 g (08/07/17 0529)     LOS: 5 days   RAMA,CHRISTINA  [facility] Pager [phone]. If unable to reach me by pager, please call my cell phone at [phone].  *Please refer to [email or web address], password [email or web address] to get updated schedule on who will round on this patient, as hospitalists switch teams weekly. If 7PM-7AM, please contact night-coverage at [email or web address], password [email or web address] for any overnight needs.  08/07/2017, 7:33 AM

## 2017-08-09 LAB — CULTURE, BLOOD (ROUTINE X 2)
CULTURE: NO GROWTH
Culture: NO GROWTH
Special Requests: ADEQUATE

## 2017-08-13 ENCOUNTER — Ambulatory Visit: Payer: Medicare Other | Admitting: Cardiology

## 2017-09-06 NOTE — Progress Notes (Signed)
175 ml of Morphine wasted witnessed by Dwale, RN & Marian Sorrow, RN.

## 2017-09-06 NOTE — Progress Notes (Signed)
Patient not currently on BiPAP. Will check back on him later

## 2017-09-06 NOTE — Progress Notes (Addendum)
Speech Language Pathology Discharge Patient Details Name: Dakota Shannon MRN: [id number] DOB: 12-01-1934 Today's Date: 2017-09-03 Time:  -     Patient discharged from SLP services secondary to pt actively dying per RN and notes and is on morphine drip.  Please see latest therapy progress note for current level of functioning and progress toward goals.    Progress and discharge plan discussed with patient and/or caregiver: Patient unable to participate in discharge planning and no caregivers available  GO     Dakota Shannon 09-03-17, 9:33 AM   Orbie Pyo Colvin Caroli.[employer] [phone]

## 2017-09-06 NOTE — Progress Notes (Signed)
Continuing to provide support for this patient and family.  Dakota Shannon is aware and has allowed me to pray with him.  He thanked me once we were done praying.  Daughter and granddaughter at bedside.  Will continue to provide support and follow as the day progresses.    August 20, 2017 1104  Clinical Encounter Type  Visited With Patient and family together  Visit Type Follow-up;Psychological support;Spiritual support;Social support;Critical Care;Patient actively dying

## 2017-09-06 NOTE — Death Summary Note (Signed)
DEATH SUMMARY   Patient Details  Name: Dakota Shannon MRN: [id number] DOB: 1935-02-04  Admission/Discharge Information   Admit Date:  August 23, 2017  Date of Death: Date of Death: 08-29-2017  Time of Death: Time of Death: 01/11/1417  Length of Stay: 01-02-2023  Referring Physician: [name], MD   Reason(s) for Hospitalization    Diagnoses  Preliminary cause of death:  Secondary Diagnoses (including complications and co-morbidities):  Principal Problem:   Atrial fibrillation with RVR ([place]) Active Problems:   Non-ST elevation (NSTEMI) myocardial infarction ([place])   Acute diastolic CHF (congestive heart failure), NYHA class 3 ([place])   Renal failure syndrome   Goals of care, counseling/discussion   Palliative care encounter   Obesity (BMI 30-39.9)   Shock ([place])  Below is my last note  Brief Hospital Course (including significant findings, care, treatment, and services provided and events leading to death)  Dakota Shannon is a 82 y.o. year old male who is an 82 y.o. male with a PMH of hypertension, hyperlipidemia, Hodgkin's lymphoma, stage IV non-small cell lung cancer with brain metastases, CHF, and new onset A. fib with RVR/non-STEMI who was admitted to cardiology service for possible cath, however after a long discussion with family, he was deemed to not be a cardiac catheterization candidate. He subsequently was evaluated by the palliative care team who felt his prognosis was less than 2 weeks and possibly days without current treatments.  Principal Problem:   Atrial fibrillation with RVR (HCC)/Non-STEMI/hyperlipidemia/NSVT Patient not a candidate for cardiac catheterization. Atrial fibrillation resolved on amiodarone, maintaining normal sinus rhythm. Not a candidate for anticoagulation due to his metastatic lung cancer per cardiology.   Active Problems:   Acute respiratory failure Has been on bipap overnight after going into NSVT. Has been cyanotic per nursing.    Hypotension He is  on Neosynephrine which has had to be titrated up overnight.He is exhibiting signs of shock with peripheral vasoconstriction, cold extremities, and cyanotic fingertips/toes. His overall prognosis is poor. Palliative care met with him and his family and has now opted for full comfort measures. He has been placed on a morphine drip. Expect an in-hospital death.    Febrile neutropenia WBC recovering. Remains on empiric Zosyn.    Stage IV lung cancer with brain metastasis Diagnosed March 2016. Has received chemotherapy at [facility] from Dr. Ella Jubilee, most recently with carboplatin and Taxol, and stereotactic brain radiation therapy from Dr. Tammi Klippel. Considered terminal.    Acute diastolic CHF (congestive heart failure), NYHA class 3 (HCC) Weight up today and I/O balance positive.    Renal failure syndrome Creatinine trending up.    Goals of care, counseling/discussion/Palliative care encounter Goals of care established. DO NOT RESUSCITATE with BiPAP/pressors okay. Palliative following for possible residential hospice placement.    COPD Continue Xopenex as needed.    Obesity Body mass index is 37.44 kg/m.   Pertinent Labs and Studies  Significant Diagnostic Studies Dg Chest Port 1 View  Result Date: 08/05/2017 CLINICAL DATA:  CHF EXAM: PORTABLE CHEST 1 VIEW COMPARISON:  08/04/2017 and prior studies FINDINGS: Cardiomegaly and right Port-A-Cath with tip overlying the upper SVC again noted. Near complete resolution of bilateral interstitial and airspace opacities/edema noted. There is no evidence of airspace disease, pleural effusion or pneumothorax. IMPRESSION: Near complete resolution of bilateral interstitial and airspace opacities/edema. Electronically Signed   By: Margarette Canada M.D.   On: 08/05/2017 07:46   Dg Chest Port 1 View  Result Date: 08/04/2017 CLINICAL DATA:  Shortness  of breath. History of cancer and hypertension. EXAM: PORTABLE CHEST 1 VIEW COMPARISON:   08/03/2017 FINDINGS: Right-sided PowerPort tip overlying the superior vena cava. The heart is mildly enlarged. There are increasing bilateral airspace filling opacities primarily at the bases. Opacities are asymmetric, right greater than left, compatible with asymmetric pulmonary edema or infectious infiltrates. IMPRESSION: Increasing bilateral infiltrates right greater than left. Electronically Signed   By: Nolon Nations M.D.   On: 08/04/2017 12:41   Dg Chest Port 1 View  Result Date: 08/03/2017 CLINICAL DATA:  82 year old male with shortness of breath. Hodgkin lymphoma. EXAM: PORTABLE CHEST 1 VIEW COMPARISON:  PET-CT 04/30/2017.  Portable chest 06/15/2017. FINDINGS: Portable AP semi upright view at 1726 hours. Stable right chest porta cath, currently accessed. Stable cardiac size and mediastinal contours. Stable lung volumes. Mild streaky opacity at the right lung base is new. Lung parenchyma elsewhere appears stable. No pulmonary edema, pneumothorax or pleural effusion. Visualized tracheal air column is within normal limits. IMPRESSION: New streaky right lung base opacity. This is nonspecific, but consider developing bronchopneumonia. Electronically Signed   By: Genevie Ann M.D.   On: 08/03/2017 17:46    Microbiology Recent Results (from the past 240 hour(s))  MRSA PCR Screening     Status: None   Collection Time: 08/05/2017  4:34 PM  Result Value Ref Range Status   MRSA by PCR NEGATIVE NEGATIVE Final    Comment:        The GeneXpert MRSA Assay (FDA approved for NASAL specimens only), is one component of a comprehensive MRSA colonization surveillance program. It is not intended to diagnose MRSA infection nor to guide or monitor treatment for MRSA infections.   Culture, blood (routine x 2)     Status: None   Collection Time: 08/04/17  2:18 PM  Result Value Ref Range Status   Specimen Description BLOOD RIGHT RADIAL A LINE  Final   Special Requests   Final    BOTTLES DRAWN AEROBIC AND  ANAEROBIC Blood Culture adequate volume   Culture NO GROWTH 5 DAYS  Final   Report Status 08/09/2017 FINAL  Final  Culture, blood (routine x 2)     Status: None   Collection Time: 08/04/17  2:20 PM  Result Value Ref Range Status   Specimen Description BLOOD RIGHT CHEST PORT  Final   Special Requests   Final    BOTTLES DRAWN AEROBIC AND ANAEROBIC Blood Culture results may not be optimal due to an excessive volume of blood received in culture bottles   Culture NO GROWTH 5 DAYS  Final   Report Status 08/09/2017 FINAL  Final    Lab Basic Metabolic Panel:  Recent Labs Lab 08/03/17 0434 08/05/17 0348 08/06/17 0358 08/06/17 2322 08/06/17 2323 08/07/17 0518  NA 141 140 140 138 142 140  K 3.8 3.1* 2.8* 2.8* 2.9* 3.7  CL 120* 116* 114* 112*  --  114*  CO2 17* 16* 17* 15*  --  15*  GLUCOSE 113* 117* 114* 191* 182* 142*  BUN 29* 31* 33* 34*  --  35*  CREATININE 1.37* 2.22* 2.02* 2.15*  --  2.37*  CALCIUM 8.4* 8.3* 8.0* 8.1*  --  8.2*  MG  --   --   --  1.7  --   --    Liver Function Tests:  Recent Labs Lab 07/21/2017 1845  AST 68*  ALT 47  ALKPHOS 57  BILITOT 0.8  PROT 5.5*  ALBUMIN 3.1*   No results for input(s): LIPASE, AMYLASE  in the last 168 hours. No results for input(s): AMMONIA in the last 168 hours. CBC:  Recent Labs Lab 07/15/2017 1845 08/03/17 0434 08/04/17 0432 08/05/17 0348 08/06/17 0358 08/06/17 2323 08/07/17 0518  WBC 1.8* 2.2* 1.4* 2.4* 1.6*  --  3.3*  NEUTROABS 1.3*  --   --   --   --   --   --   HGB 10.2* 10.1* 9.8* 10.9* 9.8* 9.5* 11.3*  HCT 30.2* 30.0* 28.1* 31.9* 28.8* 28.0* 33.8*  MCV 86.0 86.0 86.2 85.8 86.2  --  86.9  PLT 106* 122* 113* 164 132*  --  205   Cardiac Enzymes:  Recent Labs Lab 08/03/2017 1845 08/03/17 0115 08/03/17 0434  TROPONINI 6.48* 15.68* 18.30*   Sepsis Labs:  Recent Labs Lab 08/04/17 0432 08/04/17 1252 08/04/17 1712 08/05/17 0348 08/06/17 0358 08/07/17 0518  WBC 1.4*  --   --  2.4* 1.6* 3.3*  LATICACIDVEN   --  1.6 0.9  --   --   --     Debbe Odea 08/09/2017, 5:20 PM

## 2017-09-06 NOTE — Progress Notes (Signed)
Daily Progress Note   Patient Name: Dakota Shannon       Date: Aug 20, 2017 DOB: 03/02/1935  Age: 82 y.o. MRN#: [id number] Attending Physician: Debbe Odea, MD Primary Care Physician: [name], MD Admit Date: 07/20/2017  Reason for Consultation/Follow-up: Establishing goals of care, terminal care  Subjective: Mr. Prude is resting comfortably. Family at bedside.   Length of Stay: 6  Current Medications: Scheduled Meds:  . Chlorhexidine Gluconate Cloth  6 each Topical Daily  . mouth rinse  15 mL Mouth Rinse BID  . sodium chloride flush  10-40 mL Intracatheter Q12H    Continuous Infusions: . sodium chloride 10 mL/hr at 08-20-17 0245  . amiodarone Stopped (20-Aug-2017 0415)  . morphine 3 mg/hr (08-20-2017 1049)  . phenylephrine (NEO-SYNEPHRINE) Adult infusion Stopped (August 20, 2017 0415)    PRN Meds: Place/Maintain arterial line **AND** sodium chloride, acetaminophen, glycopyrrolate, levalbuterol, lidocaine, LORazepam, morphine, ondansetron (ZOFRAN) IV, sodium chloride flush  Physical Exam     Constitutional: He is oriented to person, place, and time. He appears well-developed.  HENT:  Head: Normocephalic and atraumatic.  Cardiovascular: An irregularly irregular rhythm present. Tachycardia present.   Pulmonary/Chest: Effort normal. No accessory muscle usage. No tachypnea.  Abdominal: Soft. Normal appearance.  Neurological: He is sleeping.  Nursing note and vitals reviewed.  Vital Signs: BP (!) 66/42   Pulse 92   Temp 98.3 F (36.8 C) (Oral)   Resp 17   Ht 5\' 9"  (1.753 m)   Wt 115 kg (253 lb 8.5 oz)   SpO2 93%   BMI 37.44 kg/m  SpO2: SpO2: 93 % O2 Device: O2 Device: Not Delivered O2 Flow Rate: O2 Flow Rate (L/min): 2 L/min  Intake/output summary:   Intake/Output  Summary (Last 24 hours) at 08/20/17 1106 Last data filed at 2017-08-20 1000  Gross per 24 hour  Intake             2085 ml  Output              100 ml  Net             1985 ml   LBM: Last BM Date: 08/07/17 Baseline Weight: Weight: 111.9 kg (246 lb 11.1 oz) Most recent weight: Weight: 115 kg (253 lb 8.5 oz)       Palliative Assessment/Data: 10%  Patient Active Problem List   Diagnosis Date Noted  . Obesity (BMI 30-39.9) 08/07/2017  . Shock ([place]) 08/07/2017  . Goals of care, counseling/discussion   . Palliative care encounter   . Non-ST elevation (NSTEMI) myocardial infarction ([place])   . Acute diastolic CHF (congestive heart failure), NYHA class 3 ([place])   . Renal failure syndrome   . Atrial fibrillation with RVR ([place]) 07/29/2017  . Lung cancer metastatic to brain ([place]) 06/12/2017  . Gastritis due to nonsteroidal anti-inflammatory drug   . Stricture and stenosis of esophagus   . Dysphagia 06/06/2017  . Mediastinal adenopathy 05/04/2017  . COPD, moderate ([place]) 05/04/2017  . Multiple thyroid nodules 09/14/2016  . Lung mass 01/26/2015  . Lung cancer, right upper lobe 12/11/2014  . Cervical lymphadenopathy 12/11/2014  . Renal mass, right 12/11/2014  . Essential hypertension, benign 06/16/2010  . PURE HYPERCHOLESTEROLEMIA 04/18/2010  . 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 MURMUR 04/18/2010  . NONSPECIFIC ABNORMAL UNSPEC CV FUNCTION STUDY 04/18/2010    Palliative Care Assessment & Plan   HPI: 82 y.o. male with PMH of stage IV RUL NSCLC with brain mets, hodgkin's lymphoma, HTN, HLD, aortic root dilation (4cm last echo), Wolff-Parkinson-White syndrome, atrial fibrillation admitted to [facility] from [facility] 07/20/2017 with afib RVR, NSTEMI. Hospitalization complicated by inability to receive anticoagulation d/t recently radiated brain mets, continued afib, CHF exacerbation requiring BiPAP.   Assessment: Clarke awoke last night and finally  decided to transition to full comfort care. Morphine infusion was already in place and had bolus dosing with plan with nursing for transition. Per family his transition off BiPAP was very good last night and he currently appears very comfortable. Spoke with family about prognosis could be hours but could be days. Emotional support provided. Family is very accepting of EOL and comfort care. We will be available as needed.   Recommendations/Plan:  Pain/Dyspnea: Morphine 2 mg/hr with bolus 2-4 mg every 15 min prn.   Anxiety: Ativan IV 1 mg every 4 hours prn.   Robinul prn.   Goals of Care and Additional Recommendations:  Limitations on Scope of Treatment: No Artificial Feeding, No Chemotherapy and No Radiation  Code Status:  DNR  Prognosis:   Likely hours.   Discharge Planning:  Expect hospital death.   Thank you for allowing the Palliative Medicine Team to assist in the care of this patient.   Total Time 25 min Prolonged Time Billed  no       Greater than 50%  of this time was spent counseling and coordinating care related to the above assessment and plan.  Vinie Sill, NP Palliative Medicine Team Pager # [phone] (M-F 8a-5p) Team Phone # [phone] (Nights/Weekends)

## 2017-09-06 NOTE — Progress Notes (Signed)
Patient asystole on the monitor. No heart tones or breath sounds auscultated by Allena Katz, RN & Marian Sorrow, RN; pronounced at 14:18.  Attending MD notified.  Family at bedside and emotional support provided.  Dakota Shannon, Ardeth Sportsman

## 2017-09-06 DEATH — deceased

## 2017-10-16 ENCOUNTER — Ambulatory Visit: Payer: Medicare Other | Admitting: Nurse Practitioner

## 2018-08-18 IMAGING — RF DG ESOPHAGUS
14 of 20 series · 14 of 24 positions shown · non-contrast
Comparison: None.

CLINICAL DATA: Cervical dysphagia minute for solids, symptoms for
years but worsening ; past history of atrial fibrillation, Hodgkin's
lymphoma, lung cancer post resection and radiation therapy

EXAM:
ESOPHOGRAM / BARIUM SWALLOW / BARIUM TABLET STUDY
TECHNIQUE: Combined double contrast and single contrast examination performed
using effervescent crystals, thick barium liquid, and thin barium
liquid. The patient was observed with fluoroscopy swallowing a 13 mm
barium sulphate tablet.
FLUOROSCOPY TIME:  Fluoroscopy Time:  4 minutes 6 seconds
Radiation Exposure Index (if provided by the fluoroscopic device):
133.9 mGy
Number of Acquired Spot Images: multiple fluoroscopic screen
captures

[Series 1: cp_standard · 0.18mm/px · 1 of 16 frames shown (1 of 14)]
[frame 3/16]
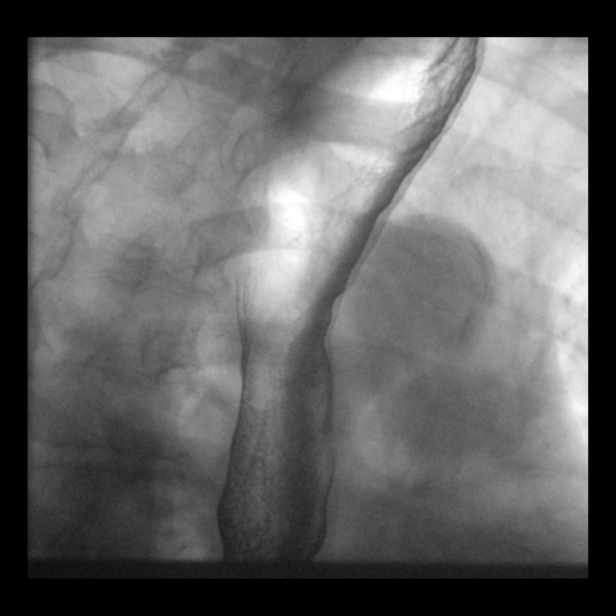

[Series 3: cp_standard · 0.18mm/px · 1 of 68 frames shown (2 of 14)]
[frame 35/68]
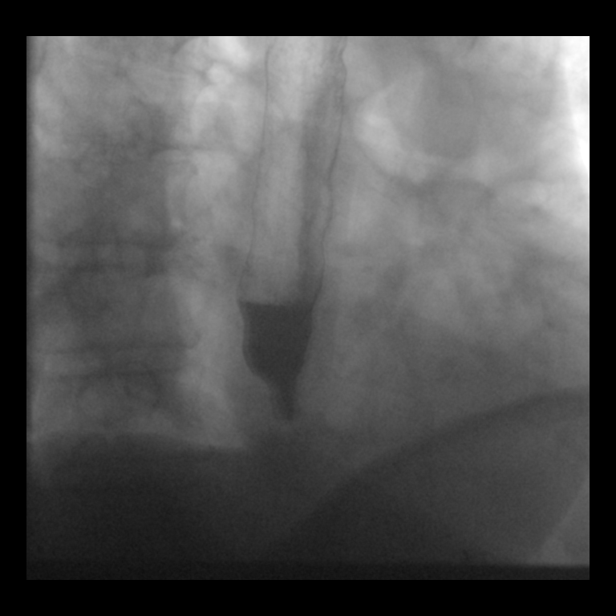

[Series 4: cp_standard · 0.18mm/px · 1 of 7 frames shown (3 of 14)]
[frame 7/7]
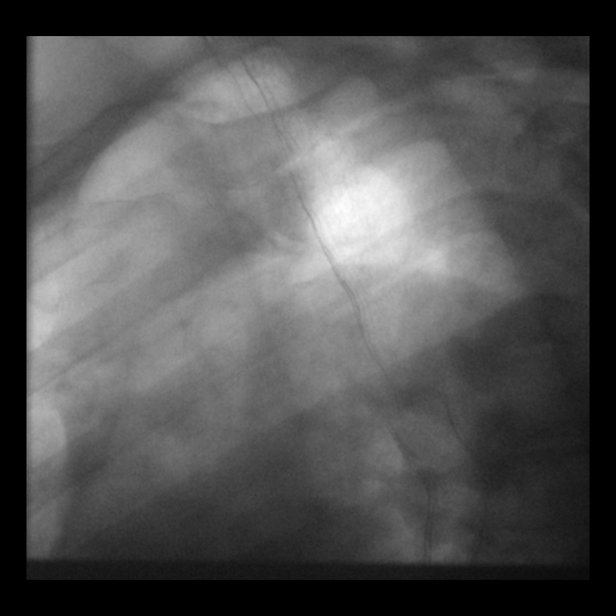

[Series 6: cp_standard · 0.18mm/px · 1 of 163 frames shown (4 of 14)]
[frame 82/163]
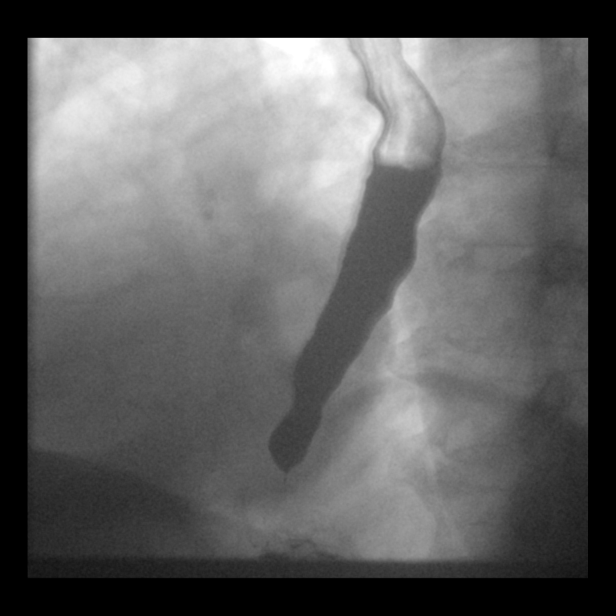

[Series 7: cp_standard · 0.18mm/px · 1 of 110 frames shown (5 of 14)]
[frame 56/110]
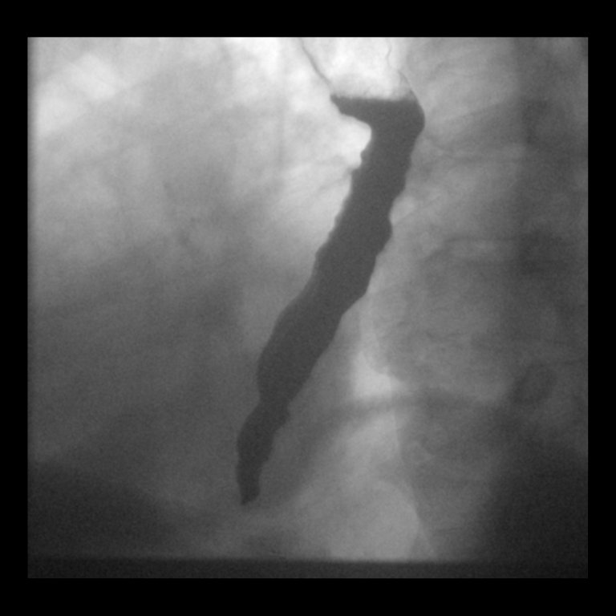

[Series 9: cp_standard · 0.18mm/px · 1 of 76 frames shown (6 of 14)]
[frame 39/76]
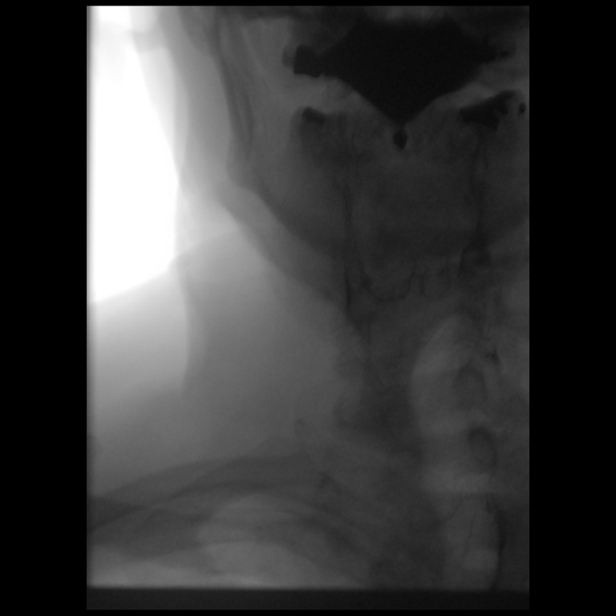

[Series 10: cp_standard · 0.17mm/px · 1 of 60 frames shown (7 of 14)]
[frame 52/60]
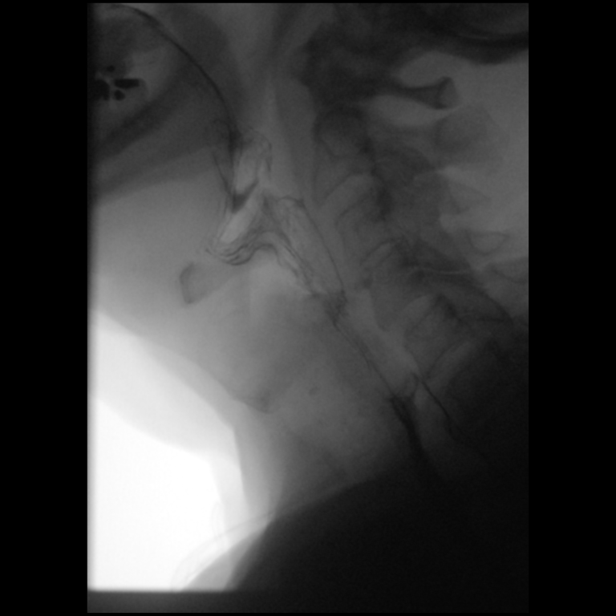

[Series 11: cp_standard · 0.17mm/px · 1 of 86 frames shown (8 of 14)]
[frame 74/86]
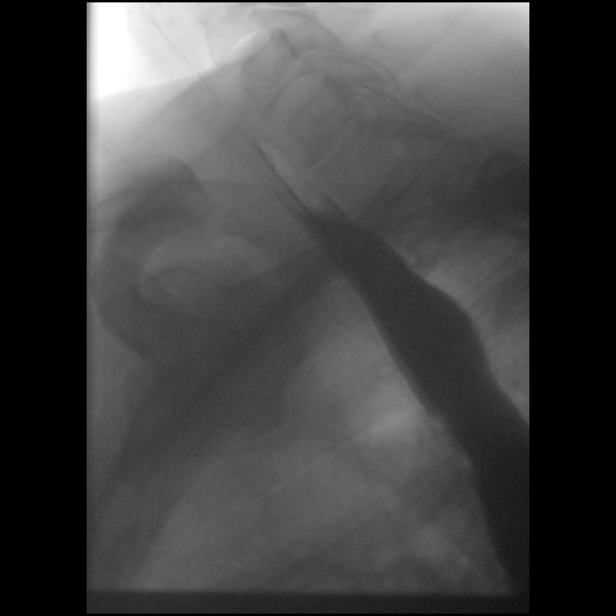

[Series 13: cp_standard · 0.17mm/px · 1 of 81 frames shown (9 of 14)]
[frame 32/81]
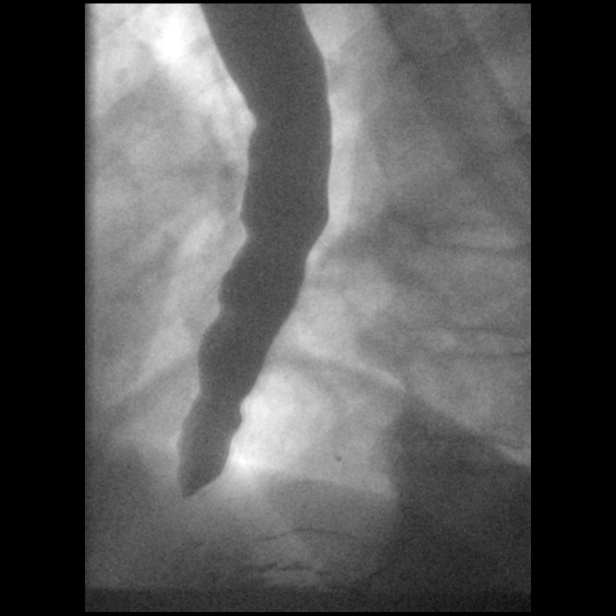

[Series 17: cp_standard · 0.18mm/px · 1 of 34 frames shown (10 of 14)]
[frame 18/34]
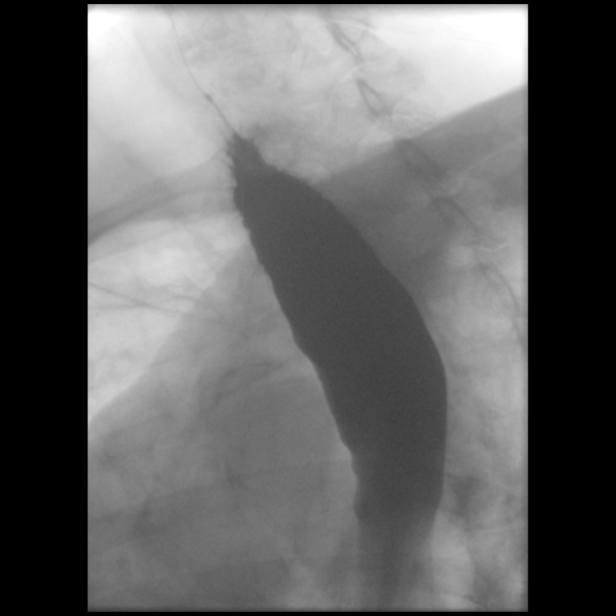

[Series 18: cp_standard · 0.18mm/px · 1 of 42 frames shown (11 of 14)]
[frame 36/42]
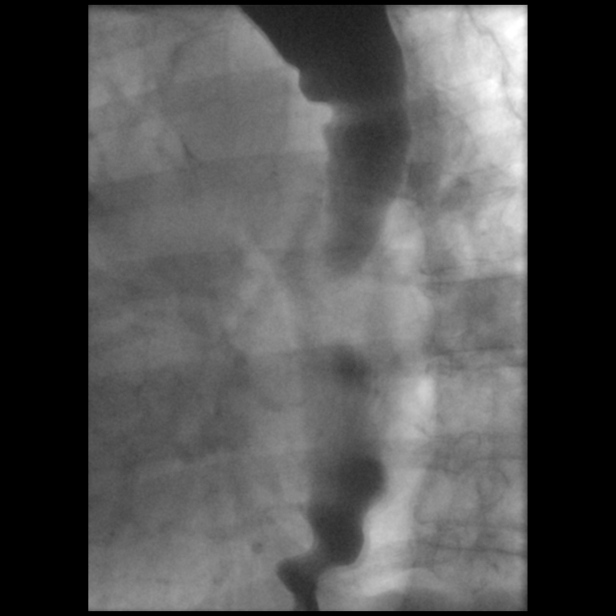

[Series 19: cp_standard · 0.18mm/px · 1 of 63 frames shown (12 of 14)]
[frame 54/63]
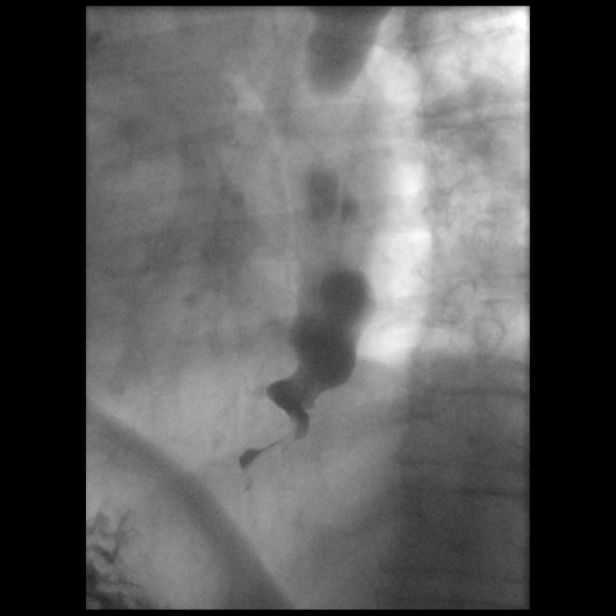

[Series 21: cp_standard · 0.19mm/px · 1 of 61 frames shown (13 of 14)]
[frame 27/61]
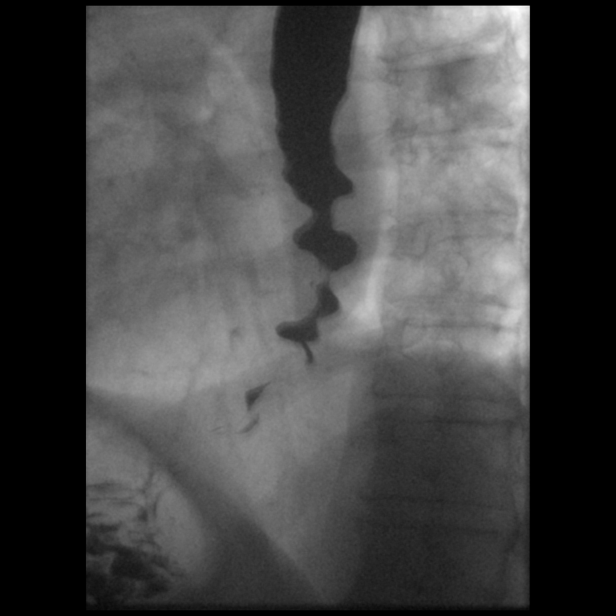

[Series 23: cp_standard · 0.19mm/px · 1 of 48 frames shown (14 of 14)]
[frame 41/48]
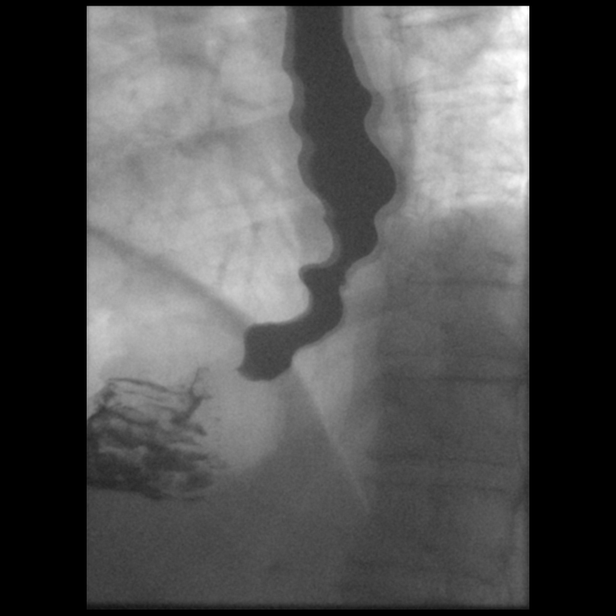

[14 of 24 positions shown; findings below may reference images not displayed]

FINDINGS: Esophageal distention: Narrowing at the gastroesophageal junction.
Remainder of esophagus distends normally.

Filling defects:  None

12.5 mm barium tablet: Obstructed at the gastroesophageal junction.
Could not pass into the stomach despite multiple swallows of water.
Contrast and water column extended proximally within the esophagus
to the level of the inferior cervical esophagus. This re-created the
patient's symptoms. After several additional swallows of thick
barium the pill eventually crossed into the stomach.

Motility: Severe diffuse esophageal motility with incomplete
clearance of barium by primary peristaltic waves. Weak inefficient
secondary waves and numerous tertiary waves were identified as well
as significant retrograde peristalsis.

Mucosa:  Smooth without irregularity or ulceration

Hypopharynx/cervical esophagus: No laryngeal penetration or
aspiration. No residuals.

Hiatal hernia:  Absent

GE reflux:  Not witnessed during exam

Other:  Atherosclerotic calcification aorta.
IMPRESSION: Severe diffuse impairment of esophageal motility.

Stricture at the gastroesophageal junction which obstructed a
mm diameter barium tablet for several minutes ; tall column of
contrast and water above the pill extended to the cervical
esophagus, re-creating patient's symptoms.

With additional swallows of heavy barium the pill eventually passed
into the stomach.

Aortic Atherosclerosis ([id number].F).

## 2018-11-20 IMAGING — DX DG CHEST 1V PORT
2 series · 2 of 2 positions shown · non-contrast
Comparison: PET-CT 04/30/2017.  Portable chest 06/15/2017.

CLINICAL DATA: 82-year-old male with shortness of breath. Hodgkin
lymphoma.

EXAM:
PORTABLE CHEST 1 VIEW

[chest ap (1 of 2)]
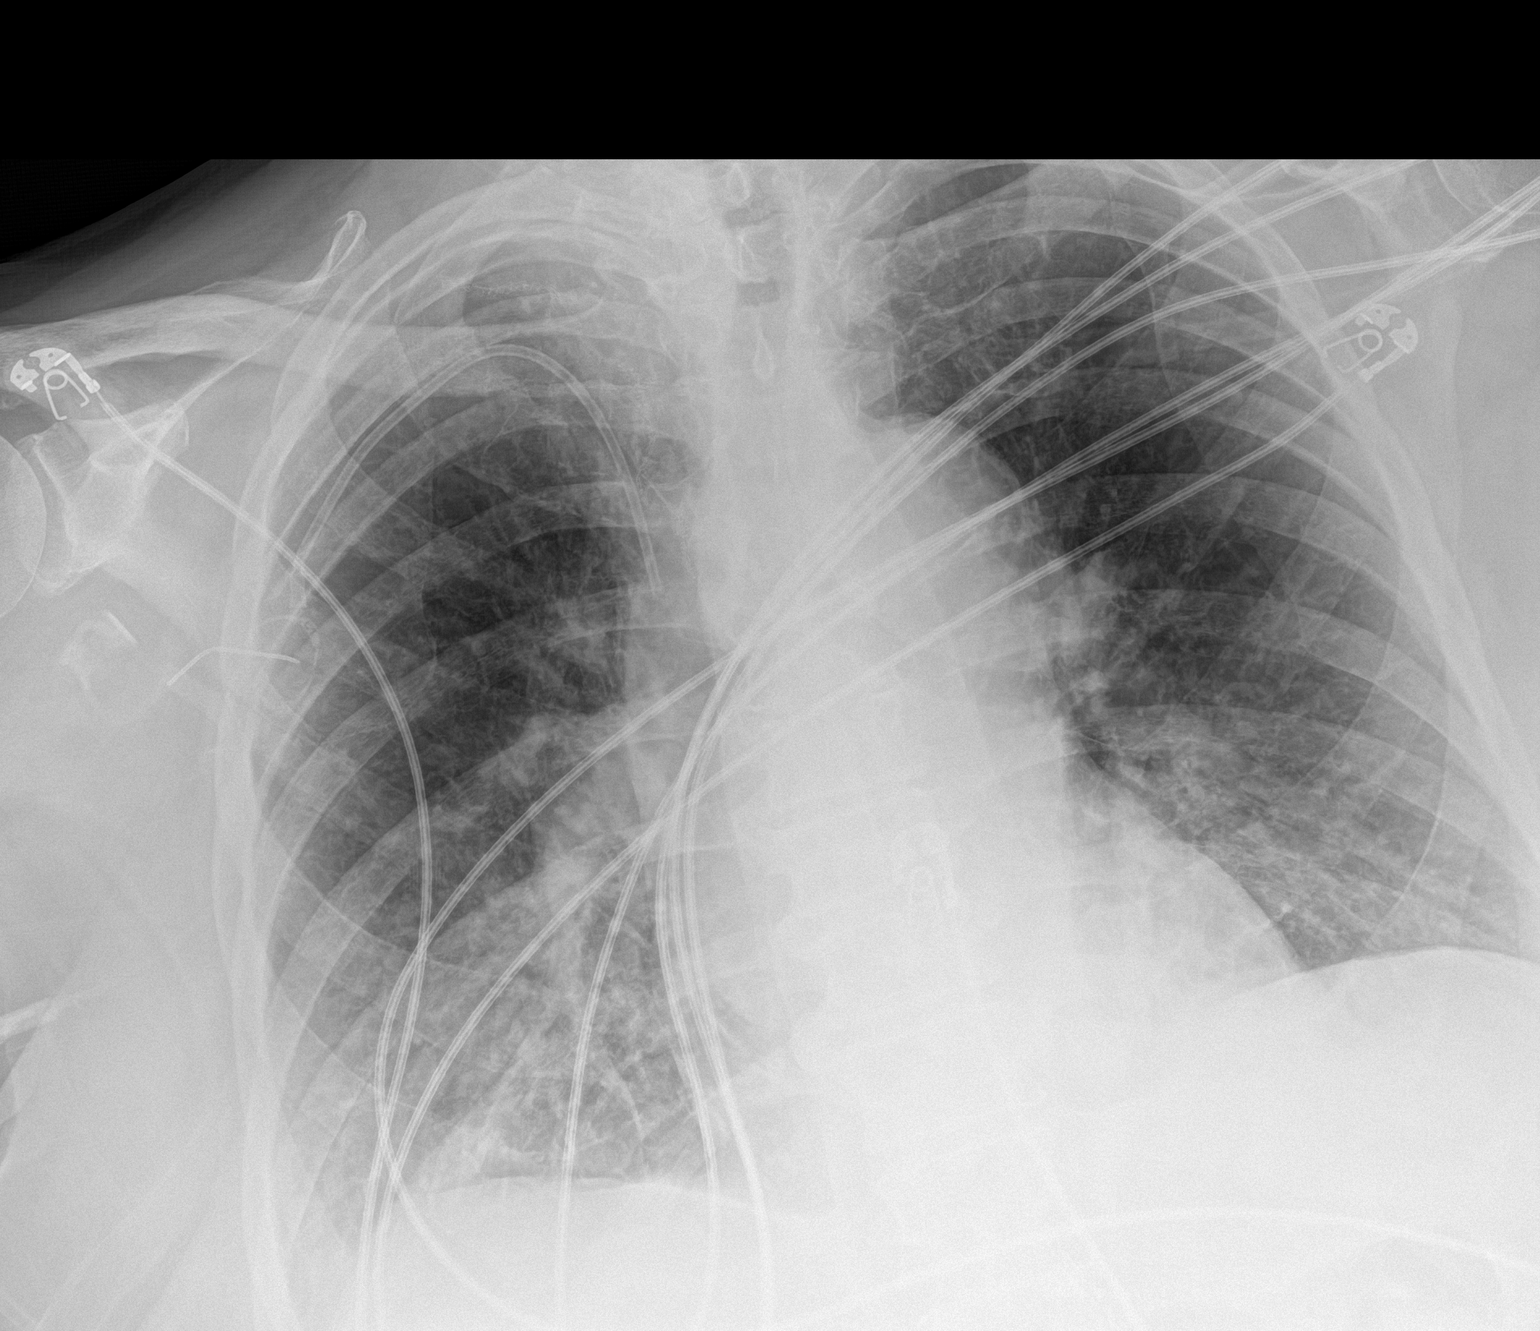

[chest ap (2 of 2)]
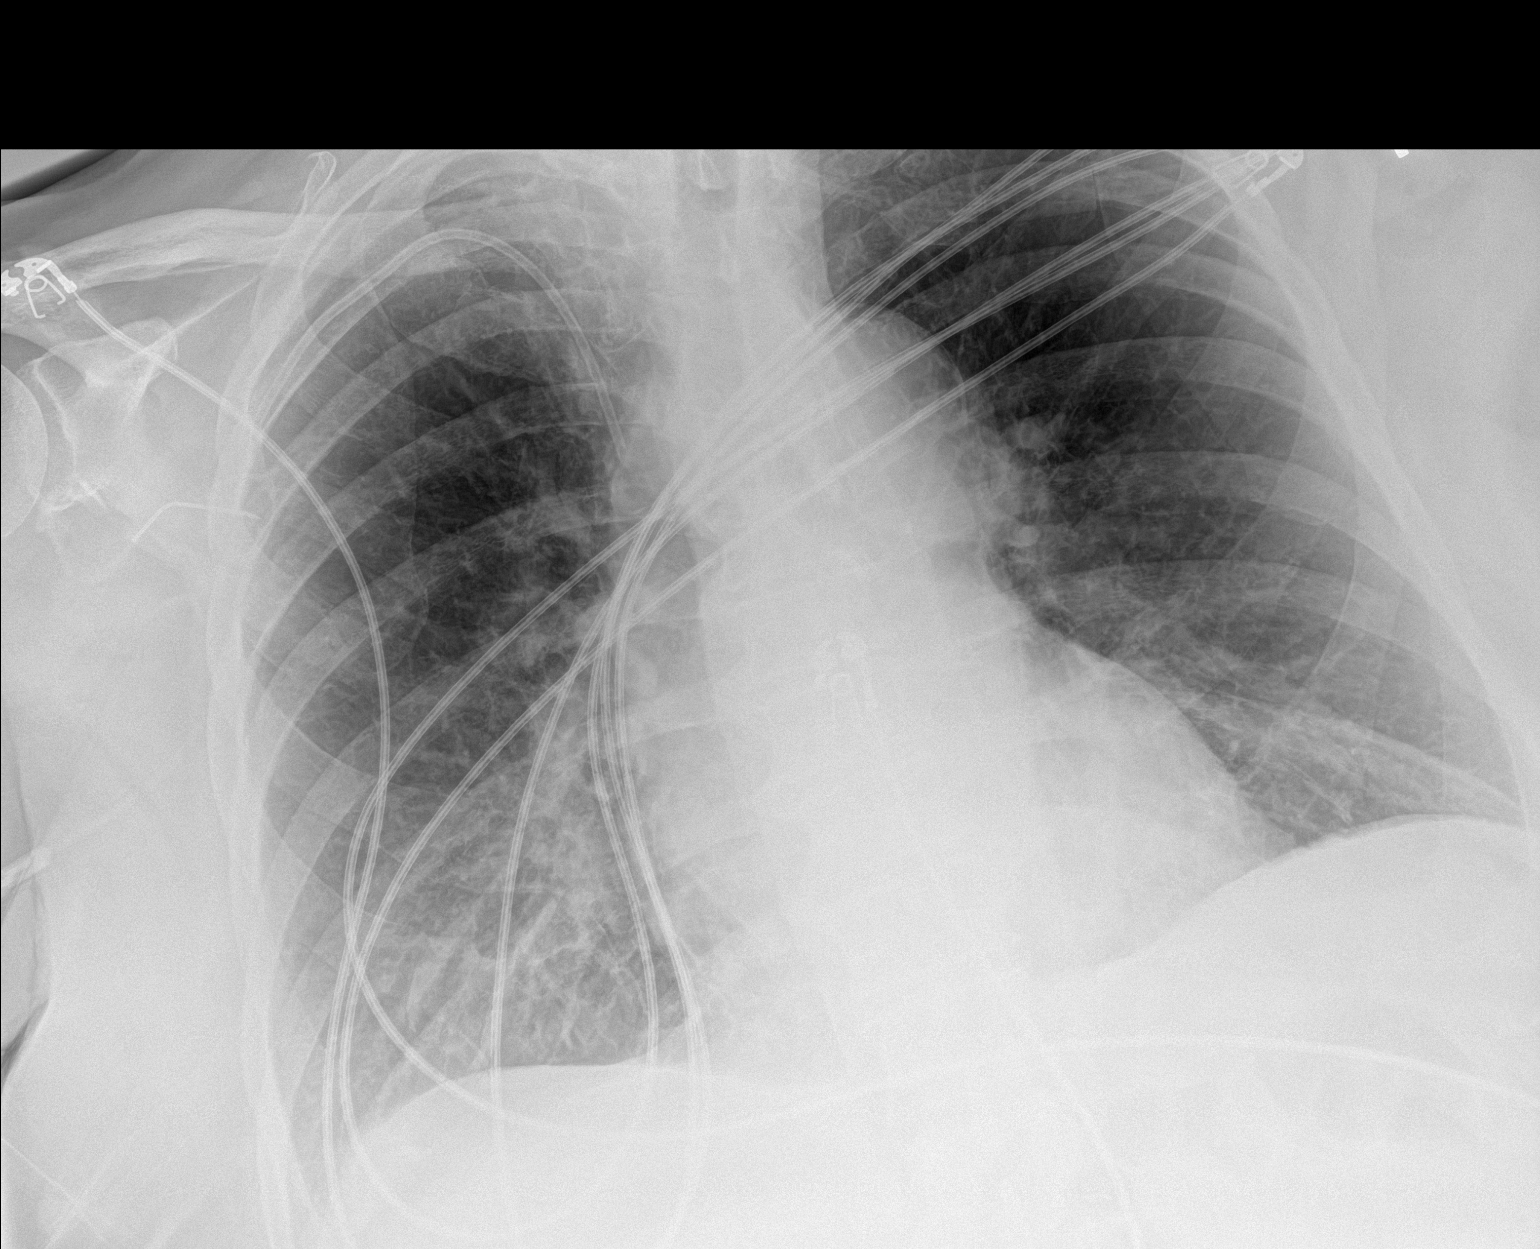

[2 of 2 positions shown; findings below may reference images not displayed]

FINDINGS: Portable AP semi upright view at 7232 hours. Stable right chest
porta cath, currently accessed. Stable cardiac size and mediastinal
contours. Stable lung volumes. Mild streaky opacity at the right
lung base is new. Lung parenchyma elsewhere appears stable. No
pulmonary edema, pneumothorax or pleural effusion. Visualized
tracheal air column is within normal limits.
IMPRESSION: New streaky right lung base opacity. This is nonspecific, but
consider developing bronchopneumonia.
